# Patient Record
Sex: Male | Born: 1960 | Race: Black or African American | Hispanic: No | Marital: Single | Smoking: Former smoker
Health system: Southern US, Community
[De-identification: ages and names within clinical notes are randomized; demographics above are authoritative.]

## PROBLEM LIST (undated history)

## (undated) DIAGNOSIS — F329 Major depressive disorder, single episode, unspecified: Secondary | ICD-10-CM

## (undated) DIAGNOSIS — I10 Essential (primary) hypertension: Secondary | ICD-10-CM

## (undated) DIAGNOSIS — M810 Age-related osteoporosis without current pathological fracture: Secondary | ICD-10-CM

## (undated) DIAGNOSIS — J45909 Unspecified asthma, uncomplicated: Secondary | ICD-10-CM

## (undated) DIAGNOSIS — T7840XA Allergy, unspecified, initial encounter: Secondary | ICD-10-CM

## (undated) DIAGNOSIS — F32A Depression, unspecified: Secondary | ICD-10-CM

## (undated) DIAGNOSIS — E785 Hyperlipidemia, unspecified: Secondary | ICD-10-CM

## (undated) DIAGNOSIS — F419 Anxiety disorder, unspecified: Secondary | ICD-10-CM

## (undated) DIAGNOSIS — M1990 Unspecified osteoarthritis, unspecified site: Secondary | ICD-10-CM

## (undated) DIAGNOSIS — R011 Cardiac murmur, unspecified: Secondary | ICD-10-CM

## (undated) HISTORY — DX: Major depressive disorder, single episode, unspecified: F32.9

## (undated) HISTORY — DX: Hyperlipidemia, unspecified: E78.5

## (undated) HISTORY — DX: Cardiac murmur, unspecified: R01.1

## (undated) HISTORY — DX: Unspecified osteoarthritis, unspecified site: M19.90

## (undated) HISTORY — DX: Unspecified asthma, uncomplicated: J45.909

## (undated) HISTORY — DX: Depression, unspecified: F32.A

## (undated) HISTORY — PX: NO PAST SURGERIES: SHX2092

## (undated) HISTORY — DX: Age-related osteoporosis without current pathological fracture: M81.0

## (undated) HISTORY — DX: Allergy, unspecified, initial encounter: T78.40XA

## (undated) HISTORY — DX: Anxiety disorder, unspecified: F41.9

---

## 2018-05-04 ENCOUNTER — Encounter (HOSPITAL_COMMUNITY): Payer: Self-pay

## 2018-05-04 ENCOUNTER — Emergency Department (HOSPITAL_COMMUNITY)
Admission: EM | Admit: 2018-05-04 | Discharge: 2018-05-04 | Disposition: A | Discharge status: Home / Self Care | Payer: Medicare HMO | Attending: Emergency Medicine | Admitting: Emergency Medicine

## 2018-05-04 DIAGNOSIS — Z87891 Personal history of nicotine dependence: Secondary | ICD-10-CM | POA: Insufficient documentation

## 2018-05-04 DIAGNOSIS — M722 Plantar fascial fibromatosis: Secondary | ICD-10-CM

## 2018-05-04 DIAGNOSIS — M79671 Pain in right foot: Secondary | ICD-10-CM | POA: Diagnosis present

## 2018-05-04 DIAGNOSIS — I10 Essential (primary) hypertension: Secondary | ICD-10-CM | POA: Diagnosis not present

## 2018-05-04 HISTORY — DX: Essential (primary) hypertension: I10

## 2018-05-04 NOTE — ED Provider Notes (Signed)
[facility] DEPT Provider Note   CSN: [id number] Arrival date & time: 05/04/18  1828     History   Chief Complaint Chief Complaint  Patient presents with  . Foot Pain    HPI Xavier Doyle is a 57 y.o. male with a PMHx of HTN, chronic back pain, and arthritis, who presents to the ED with complaints of right foot pain for the last week.  He states that both of his feet hurt but the right foot is worse.  No injury or trauma.  He describes the pain is 7/10 constant throbbing nonradiating right foot pain on the sole in the heel, worse with movement or weightbearing, and with no treatments tried prior to arrival.  He states that he is on his feet a lot at work.  Of note, he has been out of his blood pressure medication lisinopril for the last 5 months, he has an appointment on 05/12/2018 to establish PCP care and refill of his medication.  He denies any vision changes, headache, leg swelling, fevers, chills, chest pain, shortness breath, abdominal pain, N/V/D/C, hematuria, dysuria, numbness, tingling, focal weakness, or any other complaints at this time.   The history is provided by the patient and medical records. No language interpreter was used.  Foot Pain  Pertinent negatives include no chest pain, no abdominal pain, no headaches and no shortness of breath.    Past Medical History:  Diagnosis Date  . Hypertension     There are no active problems to display for this patient.   History reviewed. No pertinent surgical history.      Home Medications    Prior to Admission medications   Not on File    Family History History reviewed. No pertinent family history.  Social History Social History   Tobacco Use  . Smoking status: Former Research scientist (life sciences)  . Smokeless tobacco: Never Used  Substance Use Topics  . Alcohol use: Yes    Frequency: Never    Comment: occasionally   . Drug use: Never     Allergies   Patient has no known allergies.   Review of  Systems Review of Systems  Constitutional: Negative for chills and fever.  Eyes: Negative for visual disturbance.  Respiratory: Negative for shortness of breath.   Cardiovascular: Negative for chest pain and leg swelling.  Gastrointestinal: Negative for abdominal pain, constipation, diarrhea, nausea and vomiting.  Genitourinary: Negative for dysuria and hematuria.  Musculoskeletal: Positive for arthralgias and myalgias.  Skin: Negative for color change.  Allergic/Immunologic: Negative for immunocompromised state.  Neurological: Negative for weakness, numbness and headaches.  Psychiatric/Behavioral: Negative for confusion.   All other systems reviewed and are negative for acute change except as noted in the HPI.    Physical Exam Updated Vital Signs BP (!) 180/101 (BP Location: Right Arm)   Pulse 95   Temp 98.4 F (36.9 C) (Oral)   Resp 18   Ht 5\' 8"  (1.727 m)   Wt 104.8 kg   SpO2 96%   BMI 35.12 kg/m   Physical Exam Vitals signs and nursing note reviewed.  Constitutional:      General: He is not in acute distress.    Appearance: Normal appearance. He is well-developed. He is not toxic-appearing.     Comments: Afebrile, nontoxic, NAD, HTN noted  HENT:     Head: Normocephalic and atraumatic.  Eyes:     General:        Right eye: No discharge.  Left eye: No discharge.     Conjunctiva/sclera: Conjunctivae normal.  Neck:     Musculoskeletal: Normal range of motion and neck supple.  Cardiovascular:     Rate and Rhythm: Normal rate.     Pulses: Normal pulses.  Pulmonary:     Effort: Pulmonary effort is normal. No respiratory distress.  Abdominal:     General: There is no distension.  Musculoskeletal: Normal range of motion.     Right foot: Normal range of motion. Tenderness present. No swelling, crepitus or deformity.       Feet:     Comments: R foot with FROM intact in all digits, no swelling or bruising, no erythema or warmth, with mild TTP to the plantar fascia  and insertion site at calcaneous, no other areas of bony/joint line TTP to remainder of foot/ankle, no crepitus or deformity, strength and sensation grossly intact, distal pulses intact, soft compartments.   Skin:    General: Skin is warm and dry.     Findings: No rash.  Neurological:     Mental Status: He is alert and oriented to person, place, and time.     Sensory: Sensation is intact. No sensory deficit.     Motor: Motor function is intact.  Psychiatric:        Mood and Affect: Mood and affect normal.        Behavior: Behavior normal.      ED Treatments / Results  Labs (all labs ordered are listed, but only abnormal results are displayed) Labs Reviewed - No data to display  EKG None  Radiology No results found.  Procedures Procedures (including critical care time)  Medications Ordered in ED Medications - No data to display   Initial Impression / Assessment and Plan / ED Course  I have reviewed the triage vital signs and the nursing notes.  Pertinent labs & imaging results that were available during my care of the patient were reviewed by me and considered in my medical decision making (see chart for details).     57 y.o. male here with bilateral foot pain worse on the right.  He is on his feet a lot at work.  On exam, very flat feet, mild tenderness to the plantar fascia and the insertion site at the calcaneus.  No other focal bony or joint line tenderness, no swelling, no skin changes, neurovascularly intact with soft compartments.  Exam consistent with plantar fasciitis.  Discussed use of frozen water bottle for stretching and icing, Tylenol/ibuprofen for pain, supportive shoes, arch support, and follow-up with podiatry.  Of note, patient hypertensive, states that he has been out of his blood pressure medications for 5 months but has an appointment on 05/12/18 to establish PCP care and refill those medications.  He is asymptomatic at this time, doubt need for further  work-up. I explained the diagnosis and have given explicit precautions to return to the ER including for any other new or worsening symptoms. The patient understands and accepts the medical plan as it's been dictated and I have answered their questions. Discharge instructions concerning home care and prescriptions have been given. The patient is STABLE and is discharged to home in good condition.    Final Clinical Impressions(s) / ED Diagnoses   Final diagnoses:  Plantar fasciitis  Essential hypertension    ED Discharge Orders    [address] [place], [place] 05/04/18 [place], [place], DO 05/04/18 1943

## 2018-05-04 NOTE — Discharge Instructions (Addendum)
Your symptoms are consistent with plantar fasciitis. Use a frozen water bottle to stretch and ice your foot each morning and evening, for at least 10-15 minutes each time. You can do these ice stretches during the day as well, as much as you'd like. Make sure you have supportive shoes with good arch support. Change your shoes every 6-12 months as they wear down. You may consider using over the counter plantar fasciitis splints to help with symptoms. Alternate between tylenol and ibuprofen as needed for pain. Follow up with the podiatrist in the next 1-2 weeks for recheck of symptoms and for ongoing management of your foot condition. Return to the ER for emergent changes or worsening symptoms.    Also, your blood pressure was high today, see your regular doctor to get started on your blood pressure medications. Eat a low salt/low sodium diet.

## 2018-05-04 NOTE — ED Triage Notes (Addendum)
Pt presents with c/o right foot pain for almost a week. Pt denies any injury to that foot but reports that it is painful to walk on. Pt does report that both feet are hurting but the right foot is the worst.

## 2018-05-26 ENCOUNTER — Encounter: Payer: Self-pay | Admitting: Gastroenterology

## 2018-06-04 ENCOUNTER — Encounter: Payer: Self-pay | Admitting: Gastroenterology

## 2018-06-04 ENCOUNTER — Ambulatory Visit (AMBULATORY_SURGERY_CENTER): Payer: Self-pay | Admitting: *Deleted

## 2018-06-04 VITALS — Ht 68.0 in | Wt 244.0 lb

## 2018-06-04 DIAGNOSIS — Z1211 Encounter for screening for malignant neoplasm of colon: Secondary | ICD-10-CM

## 2018-06-04 MED ORDER — NA SULFATE-K SULFATE-MG SULF 17.5-3.13-1.6 GM/177ML PO SOLN: 1.0000 | Freq: Once | ORAL | 0 refills | Status: AC

## 2018-06-04 NOTE — Progress Notes (Signed)
No egg or soy allergy known to patient  No past sedation , no past  intubation  No diet pills per patient No home 02 use per patient  No blood thinners per patient  Pt denies issues with constipation  No A fib or A flutter  EMMI video sent to pt's e mail --  Pt states he has no care partner---I explained to him he has to have a CP or we will not do his colon- pt called a cousin while in [place] and was told he can bring pt- explained to Xavier Doyle he has to cancel by 2-11 Tuesday to not be charged if he is not coming- he verbalized understanding

## 2018-06-10 ENCOUNTER — Telehealth: Payer: Self-pay | Admitting: Gastroenterology

## 2018-06-10 MED ORDER — NA SULFATE-K SULFATE-MG SULF 17.5-3.13-1.6 GM/177ML PO SOLN: 1.0000 | Freq: Once | ORAL | 0 refills | Status: AC

## 2018-06-10 NOTE — Telephone Encounter (Signed)
Pt has a procedure on 06-18-18 and has lose his subprep and would like to know if he can get another one.Marland KitchenMarland Kitchen

## 2018-06-10 NOTE — Telephone Encounter (Signed)
I attempted to call pt-  No answer- Left a message that I sent in a script for another  Suprep to the [facility] on [id number] - he can call with questions  Lelan Pons PV

## 2018-06-18 ENCOUNTER — Ambulatory Visit (AMBULATORY_SURGERY_CENTER): Payer: Medicare Other | Admitting: Gastroenterology

## 2018-06-18 ENCOUNTER — Encounter: Payer: Self-pay | Admitting: Gastroenterology

## 2018-06-18 VITALS — BP 143/77 | HR 76 | Temp 97.8°F | Resp 30 | Ht 68.0 in | Wt 244.0 lb

## 2018-06-18 DIAGNOSIS — D125 Benign neoplasm of sigmoid colon: Secondary | ICD-10-CM

## 2018-06-18 DIAGNOSIS — Z1211 Encounter for screening for malignant neoplasm of colon: Secondary | ICD-10-CM | POA: Diagnosis not present

## 2018-06-18 DIAGNOSIS — K635 Polyp of colon: Secondary | ICD-10-CM | POA: Diagnosis not present

## 2018-06-18 MED ORDER — SODIUM CHLORIDE 0.9 % IV SOLN: 500.0000 mL | Freq: Once | INTRAVENOUS | Status: DC

## 2018-06-18 NOTE — Op Note (Signed)
[facility] Patient Name: Xavier Doyle Procedure Date: 06/18/2018 10:08 AM MRN: [id number] Endoscopist: Thornton Park MD, MD Age: 58 Referring MD:  Date of Birth: [birth year] Gender: Male Account #: [id number] Procedure:                Colonoscopy Indications:              Screening for colorectal malignant neoplasm, This                            is the patient's first colonoscopy. No known family                            history of colon cancer or polyps. No baseline GI                            symptoms. Medicines:                See the Anesthesia note for documentation of the                            administered medications Procedure:                Pre-Anesthesia Assessment:                           - Prior to the procedure, a History and Physical                            was performed, and patient medications and                            allergies were reviewed. The patient's tolerance of                            previous anesthesia was also reviewed. The risks                            and benefits of the procedure and the sedation                            options and risks were discussed with the patient.                            All questions were answered, and informed consent                            was obtained. Prior Anticoagulants: The patient has                            taken no previous anticoagulant or antiplatelet                            agents. ASA Grade Assessment: II - A patient with  mild systemic disease. After reviewing the risks                            and benefits, the patient was deemed in                            satisfactory condition to undergo the procedure.                           After obtaining informed consent, the colonoscope                            was passed under direct vision. Throughout the                            procedure, the patient's blood pressure, pulse, and                             oxygen saturations were monitored continuously. The                            Colonoscope was introduced through the anus and                            advanced to the the terminal ileum, with                            identification of the appendiceal orifice and IC                            valve. The colonoscopy was performed without                            difficulty. The patient tolerated the procedure                            well. The quality of the bowel preparation was                            good. The terminal ileum, ileocecal valve,                            appendiceal orifice, and rectum were photographed. Scope In: 10:16:36 AM Scope Out: 10:30:24 AM Scope Withdrawal Time: 0 hours 11 minutes 55 seconds  Total Procedure Duration: 0 hours 13 minutes 48 seconds  Findings:                 The perianal and digital rectal examinations were                            normal.                           Multiple small and large-mouthed diverticula were  found in the entire colon. There was no evidence of                            diverticular bleeding.                           A 8 mm polyp was found in the sigmoid colon. The                            polyp was sessile. The polyp was removed with a hot                            snare. Resection and retrieval were complete.                            Estimated blood loss: none. Estimated blood loss:                            none.                           Non-bleeding hemorrhoids were found.                           The exam was otherwise without abnormality on                            direct and retroflexion views. Complications:            No immediate complications. Estimated blood loss:                            None. Estimated Blood Loss:     Estimated blood loss: none. Impression:               - Severe diverticulosis in the entire examined                             colon. There was no evidence of diverticular                            bleeding.                           - One 8 mm polyp in the sigmoid colon, removed with                            a hot snare. Resected and retrieved.                           - Non-bleeding hemorrhoids.                           - The examination was otherwise normal on direct  and retroflexion views. Recommendation:           - Patient has a contact number available for                            emergencies. The signs and symptoms of potential                            delayed complications were discussed with the                            patient. Return to normal activities tomorrow.                            Written discharge instructions were provided to the                            patient.                           - Resume regular diet. High fiber diet recommended.                           - Continue present medications.                           - Await pathology results.                           - Repeat colonoscopy in 5 years for surveillance if                            the polyp is adenomatous. If the polyp is benign,                            continue with routine colon cancer screening. Thornton Park MD, MD 06/18/2018 10:36:48 AM This report has been signed electronically.

## 2018-06-18 NOTE — Progress Notes (Signed)
To PACU, VSS. Report to [email or web address] 

## 2018-06-18 NOTE — Progress Notes (Signed)
Pt's states no medical or surgical changes since previsit or office visit. 

## 2018-06-18 NOTE — Patient Instructions (Signed)
   Information on polyps ,diverticulosis,hemorrhoids and high fiber diet given to you today  Await pathology on polyp removed today   YOU HAD AN ENDOSCOPIC PROCEDURE TODAY AT [facility]:   Refer to the procedure report that was given to you for any specific questions about what was found during the examination.  If the procedure report does not answer your questions, please call your gastroenterologist to clarify.  If you requested that your care partner not be given the details of your procedure findings, then the procedure report has been included in a sealed envelope for you to review at your convenience later.  YOU SHOULD EXPECT: Some feelings of bloating in the abdomen. Passage of more gas than usual.  Walking can help get rid of the air that was put into your GI tract during the procedure and reduce the bloating. If you had a lower endoscopy (such as a colonoscopy or flexible sigmoidoscopy) you may notice spotting of blood in your stool or on the toilet paper. If you underwent a bowel prep for your procedure, you may not have a normal bowel movement for a few days.  Please Note:  You might notice some irritation and congestion in your nose or some drainage.  This is from the oxygen used during your procedure.  There is no need for concern and it should clear up in a day or so.  SYMPTOMS TO REPORT IMMEDIATELY:   Following lower endoscopy (colonoscopy or flexible sigmoidoscopy):  Excessive amounts of blood in the stool  Significant tenderness or worsening of abdominal pains  Swelling of the abdomen that is new, acute  Fever of 100F or higher    For urgent or emergent issues, a gastroenterologist can be reached at any hour by calling [phone].   DIET:  We do recommend a small meal at first, but then you may proceed to your regular diet.  Drink plenty of fluids but you should avoid alcoholic beverages for 24 hours.  ACTIVITY:  You should plan to take it easy for  the rest of today and you should NOT DRIVE or use heavy machinery until tomorrow (because of the sedation medicines used during the test).    FOLLOW UP: Our staff will call the number listed on your records the next business day following your procedure to check on you and address any questions or concerns that you may have regarding the information given to you following your procedure. If we do not reach you, we will leave a message.  However, if you are feeling well and you are not experiencing any problems, there is no need to return our call.  We will assume that you have returned to your regular daily activities without incident.  If any biopsies were taken you will be contacted by phone or by letter within the next 1-3 weeks.  Please call us at [phone] if you have not heard about the biopsies in 3 weeks.    SIGNATURES/CONFIDENTIALITY: You and/or your care partner have signed paperwork which will be entered into your electronic medical record.  These signatures attest to the fact that that the information above on your After Visit Summary has been reviewed and is understood.  Full responsibility of the confidentiality of this discharge information lies with you and/or your care-partner.

## 2018-06-21 ENCOUNTER — Telehealth: Payer: Self-pay

## 2018-06-21 ENCOUNTER — Telehealth: Payer: Self-pay | Admitting: *Deleted

## 2018-06-21 NOTE — Telephone Encounter (Signed)
Mailbox full ,unable to leave message on f/u call 

## 2018-06-21 NOTE — Telephone Encounter (Signed)
  Follow up Call-  Call back number 06/18/2018  Post procedure Call Back phone  # [phone]  Permission to leave phone message Yes     No ID on voicemail; no message left

## 2018-06-24 ENCOUNTER — Encounter: Payer: Self-pay | Admitting: Gastroenterology

## 2019-05-22 ENCOUNTER — Encounter (HOSPITAL_COMMUNITY): Payer: Self-pay | Admitting: Emergency Medicine

## 2019-05-22 ENCOUNTER — Inpatient Hospital Stay (HOSPITAL_COMMUNITY)
Admission: EM | Admit: 2019-05-22 | Discharge: 2019-07-04 | DRG: 286 | Disposition: E | Payer: Medicare Other | Attending: Pulmonary Disease | Admitting: Pulmonary Disease

## 2019-05-22 ENCOUNTER — Emergency Department (HOSPITAL_COMMUNITY): Payer: Medicare Other

## 2019-05-22 ENCOUNTER — Other Ambulatory Visit: Payer: Self-pay

## 2019-05-22 DIAGNOSIS — Z9189 Other specified personal risk factors, not elsewhere classified: Secondary | ICD-10-CM

## 2019-05-22 DIAGNOSIS — R0600 Dyspnea, unspecified: Secondary | ICD-10-CM

## 2019-05-22 DIAGNOSIS — J9600 Acute respiratory failure, unspecified whether with hypoxia or hypercapnia: Secondary | ICD-10-CM

## 2019-05-22 DIAGNOSIS — I272 Pulmonary hypertension, unspecified: Secondary | ICD-10-CM | POA: Diagnosis present

## 2019-05-22 DIAGNOSIS — J15211 Pneumonia due to Methicillin susceptible Staphylococcus aureus: Secondary | ICD-10-CM | POA: Diagnosis not present

## 2019-05-22 DIAGNOSIS — N1831 Chronic kidney disease, stage 3a: Secondary | ICD-10-CM | POA: Diagnosis present

## 2019-05-22 DIAGNOSIS — I472 Ventricular tachycardia: Secondary | ICD-10-CM | POA: Diagnosis not present

## 2019-05-22 DIAGNOSIS — Z515 Encounter for palliative care: Secondary | ICD-10-CM

## 2019-05-22 DIAGNOSIS — I130 Hypertensive heart and chronic kidney disease with heart failure and stage 1 through stage 4 chronic kidney disease, or unspecified chronic kidney disease: Secondary | ICD-10-CM | POA: Diagnosis not present

## 2019-05-22 DIAGNOSIS — I493 Ventricular premature depolarization: Secondary | ICD-10-CM | POA: Diagnosis present

## 2019-05-22 DIAGNOSIS — I5021 Acute systolic (congestive) heart failure: Secondary | ICD-10-CM

## 2019-05-22 DIAGNOSIS — J181 Lobar pneumonia, unspecified organism: Secondary | ICD-10-CM

## 2019-05-22 DIAGNOSIS — E874 Mixed disorder of acid-base balance: Secondary | ICD-10-CM | POA: Diagnosis not present

## 2019-05-22 DIAGNOSIS — Z1623 Resistance to quinolones and fluoroquinolones: Secondary | ICD-10-CM | POA: Diagnosis present

## 2019-05-22 DIAGNOSIS — G931 Anoxic brain damage, not elsewhere classified: Secondary | ICD-10-CM

## 2019-05-22 DIAGNOSIS — Y95 Nosocomial condition: Secondary | ICD-10-CM | POA: Diagnosis not present

## 2019-05-22 DIAGNOSIS — E1165 Type 2 diabetes mellitus with hyperglycemia: Secondary | ICD-10-CM | POA: Diagnosis not present

## 2019-05-22 DIAGNOSIS — I462 Cardiac arrest due to underlying cardiac condition: Secondary | ICD-10-CM | POA: Diagnosis not present

## 2019-05-22 DIAGNOSIS — R079 Chest pain, unspecified: Secondary | ICD-10-CM | POA: Diagnosis not present

## 2019-05-22 DIAGNOSIS — T17908A Unspecified foreign body in respiratory tract, part unspecified causing other injury, initial encounter: Secondary | ICD-10-CM

## 2019-05-22 DIAGNOSIS — E876 Hypokalemia: Secondary | ICD-10-CM | POA: Diagnosis not present

## 2019-05-22 DIAGNOSIS — I509 Heart failure, unspecified: Secondary | ICD-10-CM

## 2019-05-22 DIAGNOSIS — J9602 Acute respiratory failure with hypercapnia: Secondary | ICD-10-CM | POA: Diagnosis not present

## 2019-05-22 DIAGNOSIS — R569 Unspecified convulsions: Secondary | ICD-10-CM | POA: Diagnosis not present

## 2019-05-22 DIAGNOSIS — Z6841 Body Mass Index (BMI) 40.0 and over, adult: Secondary | ICD-10-CM

## 2019-05-22 DIAGNOSIS — R509 Fever, unspecified: Secondary | ICD-10-CM

## 2019-05-22 DIAGNOSIS — M810 Age-related osteoporosis without current pathological fracture: Secondary | ICD-10-CM | POA: Diagnosis present

## 2019-05-22 DIAGNOSIS — G4730 Sleep apnea, unspecified: Secondary | ICD-10-CM | POA: Diagnosis present

## 2019-05-22 DIAGNOSIS — J45909 Unspecified asthma, uncomplicated: Secondary | ICD-10-CM | POA: Diagnosis present

## 2019-05-22 DIAGNOSIS — T85598A Other mechanical complication of other gastrointestinal prosthetic devices, implants and grafts, initial encounter: Secondary | ICD-10-CM

## 2019-05-22 DIAGNOSIS — G9341 Metabolic encephalopathy: Secondary | ICD-10-CM | POA: Diagnosis not present

## 2019-05-22 DIAGNOSIS — E870 Hyperosmolality and hypernatremia: Secondary | ICD-10-CM | POA: Diagnosis not present

## 2019-05-22 DIAGNOSIS — R34 Anuria and oliguria: Secondary | ICD-10-CM | POA: Diagnosis not present

## 2019-05-22 DIAGNOSIS — E785 Hyperlipidemia, unspecified: Secondary | ICD-10-CM

## 2019-05-22 DIAGNOSIS — I10 Essential (primary) hypertension: Secondary | ICD-10-CM

## 2019-05-22 DIAGNOSIS — Z4659 Encounter for fitting and adjustment of other gastrointestinal appliance and device: Secondary | ICD-10-CM

## 2019-05-22 DIAGNOSIS — R7303 Prediabetes: Secondary | ICD-10-CM

## 2019-05-22 DIAGNOSIS — Z452 Encounter for adjustment and management of vascular access device: Secondary | ICD-10-CM

## 2019-05-22 DIAGNOSIS — M19071 Primary osteoarthritis, right ankle and foot: Secondary | ICD-10-CM | POA: Diagnosis present

## 2019-05-22 DIAGNOSIS — M170 Bilateral primary osteoarthritis of knee: Secondary | ICD-10-CM | POA: Diagnosis present

## 2019-05-22 DIAGNOSIS — G253 Myoclonus: Secondary | ICD-10-CM | POA: Diagnosis not present

## 2019-05-22 DIAGNOSIS — F1010 Alcohol abuse, uncomplicated: Secondary | ICD-10-CM | POA: Diagnosis present

## 2019-05-22 DIAGNOSIS — I4901 Ventricular fibrillation: Secondary | ICD-10-CM

## 2019-05-22 DIAGNOSIS — I5041 Acute combined systolic (congestive) and diastolic (congestive) heart failure: Secondary | ICD-10-CM | POA: Diagnosis present

## 2019-05-22 DIAGNOSIS — Z978 Presence of other specified devices: Secondary | ICD-10-CM

## 2019-05-22 DIAGNOSIS — Z66 Do not resuscitate: Secondary | ICD-10-CM | POA: Diagnosis not present

## 2019-05-22 DIAGNOSIS — Z20822 Contact with and (suspected) exposure to covid-19: Secondary | ICD-10-CM | POA: Diagnosis present

## 2019-05-22 DIAGNOSIS — J9691 Respiratory failure, unspecified with hypoxia: Secondary | ICD-10-CM

## 2019-05-22 DIAGNOSIS — F17210 Nicotine dependence, cigarettes, uncomplicated: Secondary | ICD-10-CM | POA: Diagnosis present

## 2019-05-22 DIAGNOSIS — J9819 Other pulmonary collapse: Secondary | ICD-10-CM

## 2019-05-22 DIAGNOSIS — E1122 Type 2 diabetes mellitus with diabetic chronic kidney disease: Secondary | ICD-10-CM | POA: Diagnosis present

## 2019-05-22 DIAGNOSIS — M47816 Spondylosis without myelopathy or radiculopathy, lumbar region: Secondary | ICD-10-CM | POA: Diagnosis present

## 2019-05-22 DIAGNOSIS — I428 Other cardiomyopathies: Secondary | ICD-10-CM

## 2019-05-22 DIAGNOSIS — Z7189 Other specified counseling: Secondary | ICD-10-CM

## 2019-05-22 DIAGNOSIS — J690 Pneumonitis due to inhalation of food and vomit: Secondary | ICD-10-CM | POA: Diagnosis not present

## 2019-05-22 DIAGNOSIS — J9601 Acute respiratory failure with hypoxia: Secondary | ICD-10-CM | POA: Diagnosis not present

## 2019-05-22 DIAGNOSIS — N179 Acute kidney failure, unspecified: Secondary | ICD-10-CM | POA: Diagnosis not present

## 2019-05-22 DIAGNOSIS — R7401 Elevation of levels of liver transaminase levels: Secondary | ICD-10-CM | POA: Diagnosis not present

## 2019-05-22 DIAGNOSIS — M19072 Primary osteoarthritis, left ankle and foot: Secondary | ICD-10-CM | POA: Diagnosis present

## 2019-05-22 DIAGNOSIS — L8990 Pressure ulcer of unspecified site, unspecified stage: Secondary | ICD-10-CM | POA: Insufficient documentation

## 2019-05-22 DIAGNOSIS — R627 Adult failure to thrive: Secondary | ICD-10-CM

## 2019-05-22 DIAGNOSIS — I469 Cardiac arrest, cause unspecified: Secondary | ICD-10-CM

## 2019-05-22 LAB — TROPONIN I (HIGH SENSITIVITY)
Troponin I (High Sensitivity): 52 ng/L — ABNORMAL HIGH (ref <18)
Troponin I (High Sensitivity): 54 ng/L — ABNORMAL HIGH (ref <18)

## 2019-05-22 LAB — CBC
HCT: 40.6 % (ref 39.0–52.0)
Hemoglobin: 11.8 g/dL — ABNORMAL LOW (ref 13.0–17.0)
MCH: 23 pg — ABNORMAL LOW (ref 26.0–34.0)
MCHC: 29.1 g/dL — ABNORMAL LOW (ref 30.0–36.0)
MCV: 79.3 fL — ABNORMAL LOW (ref 80.0–100.0)
Platelets: 273 10*3/uL (ref 150–400)
RBC: 5.12 MIL/uL (ref 4.22–5.81)
RDW: 16.4 % — ABNORMAL HIGH (ref 11.5–15.5)
WBC: 6.9 10*3/uL (ref 4.0–10.5)
nRBC: 0 % (ref 0.0–0.2)

## 2019-05-22 LAB — BASIC METABOLIC PANEL
Anion gap: 9 (ref 5–15)
BUN: 12 mg/dL (ref 6–20)
CO2: 25 mmol/L (ref 22–32)
Calcium: 8.7 mg/dL — ABNORMAL LOW (ref 8.9–10.3)
Chloride: 107 mmol/L (ref 98–111)
Creatinine, Ser: 1.2 mg/dL (ref 0.61–1.24)
GFR calc Af Amer: >60 mL/min (ref >60)
GFR calc non Af Amer: >60 mL/min (ref >60)
Glucose, Bld: 114 mg/dL — ABNORMAL HIGH (ref 70–99)
Potassium: 3.5 mmol/L (ref 3.5–5.1)
Sodium: 141 mmol/L (ref 135–145)

## 2019-05-22 LAB — LIPID PANEL
Cholesterol: 188 mg/dL (ref 0–200)
HDL: 39 mg/dL — ABNORMAL LOW (ref >40)
LDL Cholesterol: 120 mg/dL — ABNORMAL HIGH (ref 0–99)
Total CHOL/HDL Ratio: 4.8 RATIO
Triglycerides: 145 mg/dL (ref <150)
VLDL: 29 mg/dL (ref 0–40)

## 2019-05-22 LAB — RESPIRATORY PANEL BY RT PCR (FLU A&B, COVID)
Influenza A by PCR: NEGATIVE
Influenza B by PCR: NEGATIVE
SARS Coronavirus 2 by RT PCR: NEGATIVE

## 2019-05-22 LAB — HIV ANTIBODY (ROUTINE TESTING W REFLEX): HIV Screen 4th Generation wRfx: NONREACTIVE

## 2019-05-22 LAB — CBG MONITORING, ED: Glucose-Capillary: 104 mg/dL — ABNORMAL HIGH (ref 70–99)

## 2019-05-22 LAB — HEPARIN LEVEL (UNFRACTIONATED): Heparin Unfractionated: <0.1 IU/mL — ABNORMAL LOW (ref 0.30–0.70)

## 2019-05-22 LAB — D-DIMER, QUANTITATIVE: D-Dimer, Quant: 0.47 ug/mL-FEU (ref 0.00–0.50)

## 2019-05-22 LAB — TSH: TSH: 1.149 u[IU]/mL (ref 0.350–4.500)

## 2019-05-22 MED ORDER — HEPARIN BOLUS VIA INFUSION
4000.0000 [IU] | Freq: Once | INTRAVENOUS | Status: AC
  Administered 2019-05-22: 13:00:00 4000 [IU] via INTRAVENOUS
  Filled 2019-05-22: qty 4000

## 2019-05-22 MED ORDER — MORPHINE SULFATE (PF) 4 MG/ML IV SOLN
4.0000 mg | Freq: Once | INTRAVENOUS | Status: AC
  Administered 2019-05-22: 12:00:00 4 mg via INTRAVENOUS
  Filled 2019-05-22: qty 1

## 2019-05-22 MED ORDER — ASPIRIN 81 MG PO CHEW: 324.0000 mg | CHEWABLE_TABLET | Freq: Once | ORAL | Status: DC

## 2019-05-22 MED ORDER — TAMSULOSIN HCL 0.4 MG PO CAPS
0.4000 mg | ORAL_CAPSULE | Freq: Every day | ORAL | Status: DC
  Administered 2019-05-23 – 2019-05-24 (×2): 0.4 mg via ORAL
  Filled 2019-05-22 (×2): qty 1

## 2019-05-22 MED ORDER — ONDANSETRON HCL 4 MG/2ML IJ SOLN: 4.0000 mg | Freq: Four times a day (QID) | INTRAMUSCULAR | Status: DC | PRN

## 2019-05-22 MED ORDER — HEPARIN (PORCINE) 25000 UT/250ML-% IV SOLN
1150.0000 [IU]/h | INTRAVENOUS | Status: DC
  Administered 2019-05-22: 13:00:00 1150 [IU]/h via INTRAVENOUS
  Filled 2019-05-22: qty 250

## 2019-05-22 MED ORDER — ENOXAPARIN SODIUM 40 MG/0.4ML ~~LOC~~ SOLN
40.0000 mg | SUBCUTANEOUS | Status: DC
  Administered 2019-05-22 – 2019-05-23 (×2): 40 mg via SUBCUTANEOUS
  Filled 2019-05-22 (×2): qty 0.4

## 2019-05-22 MED ORDER — HYDRALAZINE HCL 10 MG PO TABS
10.0000 mg | ORAL_TABLET | Freq: Three times a day (TID) | ORAL | Status: DC
  Administered 2019-05-22 – 2019-05-23 (×2): 10 mg via ORAL
  Filled 2019-05-22 (×2): qty 1

## 2019-05-22 MED ORDER — HYDROCHLOROTHIAZIDE 25 MG PO TABS
25.0000 mg | ORAL_TABLET | Freq: Every day | ORAL | Status: DC
  Administered 2019-05-23 – 2019-05-24 (×2): 25 mg via ORAL
  Filled 2019-05-22 (×2): qty 1

## 2019-05-22 MED ORDER — ACETAMINOPHEN 325 MG PO TABS: 650.0000 mg | ORAL_TABLET | ORAL | Status: DC | PRN

## 2019-05-22 MED ORDER — NITROGLYCERIN 0.4 MG SL SUBL: 0.4000 mg | SUBLINGUAL_TABLET | SUBLINGUAL | Status: DC | PRN

## 2019-05-22 MED ORDER — LOSARTAN POTASSIUM 50 MG PO TABS
100.0000 mg | ORAL_TABLET | Freq: Every day | ORAL | Status: DC
  Administered 2019-05-23 – 2019-05-24 (×2): 100 mg via ORAL
  Filled 2019-05-22 (×2): qty 2

## 2019-05-22 MED ORDER — ASPIRIN EC 81 MG PO TBEC
81.0000 mg | DELAYED_RELEASE_TABLET | Freq: Every day | ORAL | Status: DC
  Administered 2019-05-23 – 2019-05-25 (×3): 81 mg via ORAL
  Filled 2019-05-22 (×3): qty 1

## 2019-05-22 MED ORDER — SODIUM CHLORIDE 0.9% FLUSH
3.0000 mL | Freq: Once | INTRAVENOUS | Status: AC
  Administered 2019-05-22: 12:00:00 3 mL via INTRAVENOUS

## 2019-05-22 MED ORDER — ALBUTEROL SULFATE (2.5 MG/3ML) 0.083% IN NEBU: 2.5000 mg | INHALATION_SOLUTION | Freq: Four times a day (QID) | RESPIRATORY_TRACT | Status: DC | PRN

## 2019-05-22 NOTE — ED Notes (Signed)
Pt arrived to Rm [facility] via stretcher - alert and oriented. Monitor intact to pt. Pt denies any c/o chest pain or shortness of breath. Pt talking on cell phone w/o difficulty.

## 2019-05-22 NOTE — ED Provider Notes (Signed)
[facility] Provider Note   CSN: [id number] Arrival date & time: 06/02/2019  1057     History Chief Complaint  Patient presents with  . Chest Pain    Xavier Doyle is a 59 y.o. male.  HPI   Patient presents ED for evaluation of chest pain.  Patient states symptoms started this morning maybe around 9:00.  He was helping his mother when she started to fall.  Patient had to catch her and he started to fall himself.  Patient was able to get his mother to the bed without falling but he did fall.  Patient states he started having sharp pain in on the left side of his chest.  He did feel somewhat short of breath.  He called EMS.  He was given aspirin and nitroglycerin.  He had some slight improvement of his symptoms.  Patient does have history of hypertension and hyperlipidemia but he denies any history of heart disease.  Patient denies any fevers or chills.  No cough or shortness of breath.  Past Medical History:  Diagnosis Date  . Allergy   . Anxiety   . Arthritis    both knees and ankles, lower back   . Asthma   . Depression   . Heart murmur   . Hyperlipidemia   . Hypertension   . Osteoporosis     There are no problems to display for this patient.   Past Surgical History:  Procedure Laterality Date  . NO PAST SURGERIES         Family History  Problem Relation Age of Onset  . Colon cancer Neg Hx   . Colon polyps Neg Hx   . Esophageal cancer Neg Hx   . Rectal cancer Neg Hx   . Stomach cancer Neg Hx     Social History   Tobacco Use  . Smoking status: Former Research scientist (life sciences)  . Smokeless tobacco: Never Used  Substance Use Topics  . Alcohol use: Yes    Comment: occasionally   . Drug use: Never    Home Medications Prior to Admission medications   Medication Sig Start Date End Date Taking? Authorizing Provider  furosemide (LASIX) 40 MG tablet Take 40 mg by mouth daily as needed for fluid. 04/20/19  Yes [provider]    losartan-hydrochlorothiazide (HYZAAR) 100-25 MG tablet Take 1 tablet by mouth daily.  05/12/18  Yes [provider]  OVER THE COUNTER MEDICATION Neutragenix daily   Yes [provider]  OVER THE COUNTER MEDICATION Take 1 tablet by mouth daily. Keto diet pill    Yes [provider]  PROAIR HFA 108 (90 Base) MCG/ACT inhaler Inhale 2 puffs into the lungs every 6 (six) hours as needed for wheezing.  05/12/18  Yes [provider]  tamsulosin (FLOMAX) 0.4 MG CAPS capsule Take 0.4 mg by mouth.   Yes [provider]    Allergies    Patient has no known allergies.  Review of Systems   Review of Systems  Physical Exam Updated Vital Signs BP 119/90   Pulse 92   Temp 97.6 F (36.4 C) (Oral)   Resp (!) 23   Ht 1.727 m (5\' 8" )   Wt 124.7 kg   SpO2 95%   BMI 41.81 kg/m   Physical Exam  ED Results / Procedures / Treatments   Labs (all labs ordered are listed, but only abnormal results are displayed) Labs Reviewed  BASIC METABOLIC PANEL - Abnormal; Notable for the following  components:      Result Value   Glucose, Bld 114 (*)    Calcium 8.7 (*)    All other components within normal limits  CBC - Abnormal; Notable for the following components:   Hemoglobin 11.8 (*)    MCV 79.3 (*)    MCH 23.0 (*)    MCHC 29.1 (*)    RDW 16.4 (*)    All other components within normal limits  CBG MONITORING, ED - Abnormal; Notable for the following components:   Glucose-Capillary 104 (*)    All other components within normal limits  TROPONIN I (HIGH SENSITIVITY) - Abnormal; Notable for the following components:   Troponin I (High Sensitivity) 52 (*)    All other components within normal limits  TROPONIN I (HIGH SENSITIVITY) - Abnormal; Notable for the following components:   Troponin I (High Sensitivity) 54 (*)    All other components within normal limits  RESPIRATORY PANEL BY RT PCR (FLU A&B, COVID)  HEPARIN LEVEL (UNFRACTIONATED)    EKG EKG  Interpretation  Date/Time:  Sunday May 22 2019 11:03:12 EST Ventricular Rate:  96 PR Interval:  158 QRS Duration: 90 QT Interval:  360 QTC Calculation: 454 R Axis:   80 Text Interpretation: Sinus rhythm with Premature atrial complexes Possible Left atrial enlargement Cannot rule out Anterior infarct , age undetermined T wave abnormality, consider inferior ischemia Abnormal ECG No previous tracing Confirmed by Dorie Rank [id number]) on 06/05/2019 11:18:52 AM   Radiology DG Chest 2 View  Result Date: 05/21/2019 CLINICAL DATA:  Chest pain, shortness of breath. EXAM: CHEST - 2 VIEW COMPARISON:  None. FINDINGS: Mild cardiomegaly is noted. No pneumothorax or significant pleural effusion is noted. Lungs are clear. Bony thorax is unremarkable. IMPRESSION: No active cardiopulmonary disease. Electronically Signed   By: Marijo Conception M.D.   On: 05/10/2019 11:33    Procedures .Critical Care Performed by: Dorie Rank, MD Authorized by: Dorie Rank, MD   Critical care provider statement:    Critical care time (minutes):  45   Critical care was time spent personally by me on the following activities:  Discussions with consultants, evaluation of patient's response to treatment, examination of patient, ordering and performing treatments and interventions, ordering and review of laboratory studies, ordering and review of radiographic studies, pulse oximetry, re-evaluation of patient's condition, obtaining history from patient or surrogate and review of old charts   (including critical care time)  Medications Ordered in ED Medications  aspirin chewable tablet 324 mg (324 mg Oral Not Given 05/25/2019 1145)  heparin ADULT infusion 100 units/mL (25000 units/258mL sodium chloride 0.45%) (1,150 Units/hr Intravenous New Bag/Given 05/19/2019 1252)  sodium chloride flush (NS) 0.9 % injection 3 mL (3 mLs Intravenous Given 05/14/2019 1155)  morphine 4 MG/ML injection 4 mg (4 mg Intravenous Given 05/21/2019 1152)  heparin  bolus via infusion 4,000 Units (4,000 Units Intravenous Bolus from Bag 05/10/2019 1253)    ED Course  I have reviewed the triage vital signs and the nursing notes.  Pertinent labs & imaging results that were available during my care of the patient were reviewed by me and considered in my medical decision making (see chart for details).  Clinical Course as of May 21 1432  Nancy Fetter May 22, 2019  1209 Initial troponin is elevated.  Patient will require admission.  Will check delta troponin.   [id number] Patient states symptoms are improving.  Does have some mild discomfort still.   [JK]    Clinical Course  User Index [JK] Dorie Rank, MD   MDM Rules/Calculators/A&P                      She presented to the ED for evaluation of chest pain.  Some atypical features with the sharp discomfort however did have some shortness of breath and partial improvement with aspirin and nitroglycerin.  Patient does have risk factors and an abnormal EKG.  His troponin is elevated however the delta troponin is not significantly changed.  Patient was started on heparin.  He was also given aspirin and nitroglycerin previously.  I will consult with the medical service for admission further evaluation, possible cardiology consultation. Final Clinical Impression(s) / ED Diagnoses Final diagnoses:  None      Dorie Rank, MD 05/12/2019 1435

## 2019-05-22 NOTE — H&P (Addendum)
[facility] Admission History and Physical Service Pager: [phone]  Patient name: Xavier Doyle Medical record number: [id number] Date of birth: [birth year] Age: 59 y.o. Gender: male  Primary Care Provider: System, Pcp Not In Consultants: None Code Status: Full code Preferred Emergency Contact: Onalee Hua (mother's caretaker) [phone]  Chief Complaint: chest pain  Assessment and Plan: Xavier Doyle is a 59 y.o. male presenting with chest pain and SOB. PMH is significant for HTn, HLD and asthma.    Chest Pain Rule Out  Xavier Doyle reports chest pain that began while helping attempting to prevent his mother from falling earlier this morning. Patient's chest pain was still present despite receiving nitroglycerin and 324 ASA. He describes pain in the left side of his chest that is sharp in character.  At the time of interview, patient states that left-sided chest pain is now decreased to a minimum but is experiencing right upper pectoral pain. EKG on admission notable for PVCs but no signs of ischemia, patient does have inverted T waves.  Patient denies history of MI but does report "being between a heart attack and stroke" during a prior admission.  On exam, patient's chest pain is not reproducible by palpation, patient has systolic heart murmur, regular heart rate and no friction rub.  Potential etiologies for chest pain include musculoskeletal pain, unstable angina, GERD. He reports frequently training with weight resistance.  In the ED, patient has troponin mildly elevated with values of 52 and 54 on repeat.  Heart score 3-4 patient was started on heparin drip, will discontinue as it is low suspicion for cardiac cause for chest pain at this time.  Considered PE, Well's score 0, no further workup for PE.  History and physical most consistent with MSK pain but will admit due to Heart score of 3-4. Will admit for observation and ACS rule out overnight.  Of note, patient reports  remote history of cocaine use but denies use for several years. Will complete UDS to rule out cocaine as a potential contributing factor to chest pain.  She reports consuming ~1 quart of moonshine per week. -admit to inpatient cardiac telemetry, attending Dr. Andria Frames  -monitor Tele -Trend troponin once more in am -HIV -Hemoglobin A1c -TSH -UDS -A.m. EKG  -A.m. CBC -monitor BMP   HTN  Home medication includes losartan-HCTZ 100-25 daily.  Blood pressure elevated in ED to 151/112 patient states that he last took this medication this morning.  Home meds also include Lasix 40 as needed for fluid.  Patient denies history of heart failure or MI. -Continue losartan-HCTZ -Monitor blood pressure with vitals  Asthma  Patient reportedly uses albuterol inhaler at home prior to and in between weight training sessions. No SOB or wheezing at this time. -Continue home albuterol inhaler PRN  AKI vs CKD Cr elevated at 1.2. Unsure of patient's baseline Cr. Patient reports taking Losartan-HCTZ this AM and has lasix listed as home medication in the chart.  -monitor Cr with BMP   Alcohol use Drinks 1 quart of moonshine per week. Denies previous hospitalizations for withdrawal.  Denies ever having withdrawal symptoms. -Monitor CIWA  FEN/GI: Regular diet Prophylaxis: Lovenox 40 mg  Disposition: Admit to cardiac telemetry  History of Present Illness:  Xavier Doyle is a 59 y.o. male presenting with chest pain and increasing SOB .PMH is significant for HTn, HLD, ashtma, arthritis, anxiety, osteoporosis, depression and anxiety.  Patient reports experiencing chest pain earlier this morning while trying to prevent his mother from falling.  He  ended up falling to the floor in the process of helping his mother.  When he began to stand up, he felt strong, sharp chest pain under his left pectoral muscle.  He later felt sharp pain over his right chest.  Denies pain in his left arm, jaw, chest.  He has had one  previous episode of similar chest pain for which he was hospitalized about 1 year ago. This feels very similar to that last episode. He is concerned because during that hospitalization, he was told that he was in between a heart attack and a stroke, given similar presentations a day patient decided to seek out emergent care.  This am, he reports taking losartan-HCTZ and occasionally uses albuterol during weight lifting.  Patient reports drinking about 1 quart of moonshine per week.  Patient states that he intermittently smokes cigarettes and denies needing a nicotine patch shortness admission.  Patient reports smoking cocaine several years ago but denies current or recent use.  Review Of Systems: Per HPI with the following additions:   Review of Systems  Constitutional: Negative for chills, diaphoresis and fever.  Eyes: Negative for blurred vision and double vision.  Respiratory: Positive for shortness of breath.   Cardiovascular: Positive for chest pain.  Gastrointestinal: Negative for abdominal pain and nausea.  Genitourinary: Negative for dysuria.  Neurological: Positive for weakness. Negative for loss of consciousness and headaches.    Patient Active Problem List   Diagnosis Date Noted  . Chest pain 05/09/2019    Past Medical History: Past Medical History:  Diagnosis Date  . Allergy   . Anxiety   . Arthritis    both knees and ankles, lower back   . Asthma   . Depression   . Heart murmur   . Hyperlipidemia   . Hypertension   . Osteoporosis     Past Surgical History: Past Surgical History:  Procedure Laterality Date  . NO PAST SURGERIES      Social History: Social History   Tobacco Use  . Smoking status: Former Research scientist (life sciences)  . Smokeless tobacco: Never Used  . Tobacco comment: patient reports smoking infrequently   Substance Use Topics  . Alcohol use: Yes    Comment: 1 quart of moonshine per week   . Drug use: Yes    Types: "Crack" cocaine    Comment: denies current  use, used "several years ago"    Family History: Family History  Problem Relation Age of Onset  . Colon cancer Neg Hx   . Colon polyps Neg Hx   . Esophageal cancer Neg Hx   . Rectal cancer Neg Hx   . Stomach cancer Neg Hx      Allergies and Medications: No Known Allergies No current facility-administered medications on file prior to encounter.   Current Outpatient Medications on File Prior to Encounter  Medication Sig Dispense Refill  . furosemide (LASIX) 40 MG tablet Take 40 mg by mouth daily as needed for fluid.    Marland Kitchen losartan-hydrochlorothiazide (HYZAAR) 100-25 MG tablet Take 1 tablet by mouth daily.     Marland Kitchen OVER THE COUNTER MEDICATION Neutragenix daily    . OVER THE COUNTER MEDICATION Take 1 tablet by mouth daily. Keto diet pill     . PROAIR HFA 108 (90 Base) MCG/ACT inhaler Inhale 2 puffs into the lungs every 6 (six) hours as needed for wheezing.     . tamsulosin (FLOMAX) 0.4 MG CAPS capsule Take 0.4 mg by mouth.      Objective: BP Marland Kitchen)  151/112 (BP Location: Right Arm)   Pulse 92   Temp 97.8 F (36.6 C) (Oral)   Resp 20   Ht 5\' 8"  (1.727 m)   Wt 123 kg   SpO2 100%   BMI 41.24 kg/m   Exam: General: Obese male sitting on edge of bed in no acute distress Eyes: No scleral icterus, extraocular muscles intact bilaterally Neck: No JVD noted Cardiovascular: Systolic murmur, regular rate, no friction rub Respiratory: Patient with coughing fit while trying to take deep breaths, otherwise no crackles, no wheezing, patient stable on room air Gastrointestinal: Nontender, obese abdomen, bowel sounds present MSK: Patient moves all extremities, no deformities noted Derm: Dry and intact Neuro: Alert and oriented x4 Psych: Responds appropriately to questions, normal rate of speech  Labs and Imaging: CBC BMET  Recent Labs  Lab 05/25/2019 1110  WBC 6.9  HGB 11.8*  HCT 40.6  PLT 273   Recent Labs  Lab 05/18/2019 1110  NA 141  K 3.5  CL 107  CO2 25  BUN 12  CREATININE 1.20   GLUCOSE 114*  CALCIUM 8.7*     EKG: PVCs, inverted T waves    DG Chest 2 View  Result Date: 05/31/2019 CLINICAL DATA:  Chest pain, shortness of breath. EXAM: CHEST - 2 VIEW COMPARISON:  None. FINDINGS: Mild cardiomegaly is noted. No pneumothorax or significant pleural effusion is noted. Lungs are clear. Bony thorax is unremarkable. IMPRESSION: No active cardiopulmonary disease. Electronically Signed   By: Marijo Conception M.D.   On: 06/02/2019 11:33    Stark Klein, MD 05/31/2019, 6:43 PM PGY-1, [facility] Intern pager: [phone], text pages welcome

## 2019-05-22 NOTE — Plan of Care (Signed)
  Problem: Clinical Measurements: Goal: Ability to maintain clinical measurements within normal limits will improve Outcome: Progressing   Problem: Clinical Measurements: Goal: Cardiovascular complication will be avoided Outcome: Progressing   Problem: Pain Managment: Goal: General experience of comfort will improve Outcome: Progressing   

## 2019-05-22 NOTE — Progress Notes (Signed)
ANTICOAGULATION CONSULT NOTE - Initial Consult  Pharmacy Consult for Heparin Indication: chest pain/ACS  No Known Allergies  Patient Measurements: Height: 5\' 8"  (172.7 cm) Weight: 275 lb (124.7 kg) IBW/kg (Calculated) : 68.4 Heparin Dosing Weight: 97.3 kg  Vital Signs: Temp: 97.6 F (36.4 C) (01/17 1058) Temp Source: Oral (01/17 1058) BP: 119/90 (01/17 1200) Pulse Rate: 92 (01/17 1200)  Labs: Recent Labs    06/04/2019 1110  HGB 11.8*  HCT 40.6  PLT 273  CREATININE 1.20  TROPONINIHS 52*    Estimated Creatinine Clearance: 86.3 mL/min (by C-G formula based on SCr of 1.2 mg/dL).   Medical History: Past Medical History:  Diagnosis Date  . Allergy   . Anxiety   . Arthritis    both knees and ankles, lower back   . Asthma   . Depression   . Heart murmur   . Hyperlipidemia   . Hypertension   . Osteoporosis     Medications:  Scheduled:  . aspirin  324 mg Oral Once  . heparin  4,000 Units Intravenous Once    Assessment: Patient is a 18 yom that presented to the ED with chest pain. Patients trop is elevated and pharmacy has been asked to dose heparin for ACS in this patient.   Goal of Therapy:  Heparin level 0.3-0.7 units/ml Monitor platelets by anticoagulation protocol: Yes   Plan:  - Heparin bolus of 4000 units IV x 1 dose  - Heparin Drip @ 1150 units/hr  - Heparin level in 6 hours  - Monitor patient for s/s of bleeding and CBC while on heparin     Duanne Limerick PharmD. BCPS  05/11/2019,12:38 PM

## 2019-05-22 NOTE — ED Notes (Signed)
Patient transported to X-ray 

## 2019-05-22 NOTE — ED Triage Notes (Signed)
Pt arrives via [facility] from home with c/o sharp left sided chest pain and sob that began while he was picking his mother up off of the ground. 1nitro and 324mg  asa pta without reduction in pain, 20 g IV in L hand. 12 lead unremarkable other than occasional PVCs.

## 2019-05-23 ENCOUNTER — Observation Stay (HOSPITAL_BASED_OUTPATIENT_CLINIC_OR_DEPARTMENT_OTHER): Payer: Medicare Other

## 2019-05-23 DIAGNOSIS — R079 Chest pain, unspecified: Secondary | ICD-10-CM | POA: Diagnosis not present

## 2019-05-23 DIAGNOSIS — I340 Nonrheumatic mitral (valve) insufficiency: Secondary | ICD-10-CM

## 2019-05-23 DIAGNOSIS — I259 Chronic ischemic heart disease, unspecified: Secondary | ICD-10-CM

## 2019-05-23 DIAGNOSIS — I351 Nonrheumatic aortic (valve) insufficiency: Secondary | ICD-10-CM | POA: Diagnosis not present

## 2019-05-23 DIAGNOSIS — I5021 Acute systolic (congestive) heart failure: Secondary | ICD-10-CM | POA: Diagnosis not present

## 2019-05-23 DIAGNOSIS — I10 Essential (primary) hypertension: Secondary | ICD-10-CM | POA: Diagnosis not present

## 2019-05-23 DIAGNOSIS — E785 Hyperlipidemia, unspecified: Secondary | ICD-10-CM

## 2019-05-23 DIAGNOSIS — E782 Mixed hyperlipidemia: Secondary | ICD-10-CM

## 2019-05-23 DIAGNOSIS — R7303 Prediabetes: Secondary | ICD-10-CM

## 2019-05-23 LAB — ECHOCARDIOGRAM COMPLETE
Height: 68 in
Weight: 4332.8 oz

## 2019-05-23 LAB — BASIC METABOLIC PANEL
Anion gap: 9 (ref 5–15)
BUN: 12 mg/dL (ref 6–20)
CO2: 27 mmol/L (ref 22–32)
Calcium: 8.7 mg/dL — ABNORMAL LOW (ref 8.9–10.3)
Chloride: 103 mmol/L (ref 98–111)
Creatinine, Ser: 0.94 mg/dL (ref 0.61–1.24)
GFR calc Af Amer: >60 mL/min (ref >60)
GFR calc non Af Amer: >60 mL/min (ref >60)
Glucose, Bld: 97 mg/dL (ref 70–99)
Potassium: 3.7 mmol/L (ref 3.5–5.1)
Sodium: 139 mmol/L (ref 135–145)

## 2019-05-23 LAB — CBC
HCT: 40.2 % (ref 39.0–52.0)
Hemoglobin: 11.9 g/dL — ABNORMAL LOW (ref 13.0–17.0)
MCH: 23 pg — ABNORMAL LOW (ref 26.0–34.0)
MCHC: 29.6 g/dL — ABNORMAL LOW (ref 30.0–36.0)
MCV: 77.6 fL — ABNORMAL LOW (ref 80.0–100.0)
Platelets: 259 10*3/uL (ref 150–400)
RBC: 5.18 MIL/uL (ref 4.22–5.81)
RDW: 16.4 % — ABNORMAL HIGH (ref 11.5–15.5)
WBC: 6.3 10*3/uL (ref 4.0–10.5)
nRBC: 0 % (ref 0.0–0.2)

## 2019-05-23 LAB — TROPONIN I (HIGH SENSITIVITY): Troponin I (High Sensitivity): 58 ng/L — ABNORMAL HIGH (ref <18)

## 2019-05-23 LAB — HEMOGLOBIN A1C
Hgb A1c MFr Bld: 6.4 % — ABNORMAL HIGH (ref 4.8–5.6)
Mean Plasma Glucose: 136.98 mg/dL

## 2019-05-23 MED ORDER — SODIUM CHLORIDE 0.9 % WEIGHT BASED INFUSION
3.0000 mL/kg/h | INTRAVENOUS | Status: DC
  Administered 2019-05-24: 05:00:00 3 mL/kg/h via INTRAVENOUS

## 2019-05-23 MED ORDER — SODIUM CHLORIDE 0.9% FLUSH
3.0000 mL | Freq: Two times a day (BID) | INTRAVENOUS | Status: DC
  Administered 2019-05-24: 08:00:00 3 mL via INTRAVENOUS

## 2019-05-23 MED ORDER — SODIUM CHLORIDE 0.9% FLUSH: 3.0000 mL | INTRAVENOUS | Status: DC | PRN

## 2019-05-23 MED ORDER — SODIUM CHLORIDE 0.9 % IV SOLN: 250.0000 mL | INTRAVENOUS | Status: DC | PRN

## 2019-05-23 MED ORDER — ATORVASTATIN CALCIUM 40 MG PO TABS: 40.0000 mg | ORAL_TABLET | Freq: Every day | ORAL | Status: DC

## 2019-05-23 MED ORDER — METOPROLOL TARTRATE 50 MG PO TABS
50.0000 mg | ORAL_TABLET | Freq: Once | ORAL | Status: AC | PRN
  Administered 2019-05-23: 50 mg via ORAL
  Filled 2019-05-23: qty 1

## 2019-05-23 MED ORDER — SODIUM CHLORIDE 0.9 % WEIGHT BASED INFUSION: 1.0000 mL/kg/h | INTRAVENOUS | Status: DC

## 2019-05-23 MED ORDER — ATORVASTATIN CALCIUM 80 MG PO TABS
80.0000 mg | ORAL_TABLET | Freq: Every day | ORAL | Status: DC
  Administered 2019-05-23 – 2019-05-24 (×2): 80 mg via ORAL
  Filled 2019-05-23 (×2): qty 1

## 2019-05-23 MED ORDER — METOPROLOL SUCCINATE ER 50 MG PO TB24
50.0000 mg | ORAL_TABLET | Freq: Every day | ORAL | Status: DC
  Administered 2019-05-23 – 2019-05-24 (×2): 50 mg via ORAL
  Filled 2019-05-23 (×2): qty 1

## 2019-05-23 NOTE — Progress Notes (Addendum)
Family Medicine Teaching Service Daily Progress Note Intern Pager: [phone]  Patient name: Xavier Doyle Medical record number: [id number] Date of birth: 1961/04/07 Age: 59 y.o. Gender: male  Primary Care Provider: System, Pcp Not In Consultants: None Code Status: Full Code   Pt Overview and Major Events to Date:  1/17: admitted for ACS rule out, trop 52>54 1/18: Troponins trended up to 58  Assessment and Plan: Xavier Doyle is a 59 y.o. male presenting with chest pain and SOB. PMH is significant for HTN, HLD and asthma.    ACS Rule out   Mr. Lank reports he continues to have chest pain rated as 6/10.  Patient reports that this pain has improved since he first arrived to the hospital.  Patient is denying shortness of breath.   Pulmonary embolism with considered, however well's score 0, no further workup for PE.  History and physical most consistent with MSK pain but admitted due to Heart score of 3-4. Of note, patient reported remote history of cocaine use but denies use for several years. UDS negative.  HIV negative, hemoglobin A1c elevated at 6.4, TSH within normal limits.  CBC within normal limits. Telemetry noted to have PACs but no ST elevation.  LDL elevated at 120, cholesterol 188. -Cardiology consulted, will appreciate recommendations -Tele, NSR overnight with some PACs -Continue to trend troponin -monitor BMP   HTN  Home medication includes losartan-HCTZ 100-25 daily.  Blood pressure elevated in ED to 151/112 but decreased overnight with added hydralazine 10mg  TIDto 141/89 this AM.  Patient states that he last took Losartan yesterday AM.  Home meds also include Lasix 40 as needed for fluid.  Patient denies history of heart failure or MI. Patient denies HA, CP or SOB this AM -Continue losartan-HCTZ -Monitor blood pressure with vitals -start Metoprolol 50mg  daily  -discontinue hydralazine   Asthma  Patient reportedly uses albuterol inhaler at home prior to and in between weight  training sessions. No SOB or wheezing at this time. -Continue home albuterol inhaler PRN  AKI vs CKD Cr elevated at 1.2. Unsure of patient's baseline Cr. Patient reports taking Losartan-HCTZ this AM and has lasix listed as home medication in the chart.  -monitor Cr with BMP   Alcohol use Drinks 1 quart of moonshine per week. Denies previous hospitalizations for withdrawal.  Denies ever having withdrawal symptoms. -Monitor CIWA  HLD Patient noted to have elevated LDL at 120, cholesterol 188, HDL low at 39, triglycerides 145.  List of home medications did not include statin on admission. -We will suggest outpatient follow-up of cholesterol and starting statin -ASCVD Risk 9.4% 10 year risk of cardiovascular event  FEN/GI: Regular diet Prophylaxis: Lovenox 40 mg  Disposition: discharge pending cardiology evaluation and recs, likely discharge home today   Subjective:  Patient states that his chest pain has improved and is rated as 6/10.  Patient is denies shortness of breath this morning.  Patient inquiring as to when he may be discharged home.  Objective: Temp:  [97.8 F (36.6 C)-98.6 F (37 C)] 98.6 F (37 C) (01/18 0313) Pulse Rate:  [79-107] 79 (01/18 0313) Resp:  [15-23] 19 (01/18 0313) BP: (107-161)/(75-112) 145/107 (01/18 0855) SpO2:  [93 %-100 %] 98 % (01/18 0855) Weight:  [122.8 kg-124.7 kg] 122.8 kg (01/18 0313)  Physical Exam: General: Obese male lying in bed supine in no acute distress on room air Cardiovascular: Heart murmur noted systolic, regular rate, no friction rub Respiratory: Clear to auscultation bilaterally without crackles, able on room air Abdomen:  Obese abdomen, mildly firm to palpation, bowel sounds appreciated Extremities: Moves all extremities, no lower extremity edema bilaterally  Laboratory: Recent Labs  Lab 05/11/2019 1110 05/23/19 0728  WBC 6.9 6.3  HGB 11.8* 11.9*  HCT 40.6 40.2  PLT 273 259   Recent Labs  Lab 05/06/2019 1110  05/23/19 0728  NA 141 139  K 3.5 3.7  CL 107 103  CO2 25 27  BUN 12 12  CREATININE 1.20 0.94  CALCIUM 8.7* 8.7*  GLUCOSE 114* 97    Imaging/Diagnostic Tests: No results found.   Stark Klein, MD 05/23/2019, 11:30 AM PGY-1, [facility] Intern pager: [phone], text pages welcome

## 2019-05-23 NOTE — Consult Note (Addendum)
The patient has been seen in conjunction with Daune Perch, NP. All aspects of care have been considered and discussed. The patient has been personally interviewed, examined, and all clinical data has been reviewed.   Chest pain with atypical features for ischemia.  Multiple risk factors for vascular events: Prediabetes; hyperlipidemia; essential hypertension; obesity; possible OSA.  Coronary CTA with morphology to exclude high risk anatomy such as left main disease or proximal three-vessel disease.  Aggressive risk factor modification needs to occur.  Therapy should be with preventive agents including high intensity statin therapy, ARB therapy, and consideration of SGLT2 (future).  Will need diuretic therapy for facilitation of BP control.  Long conversation concerning the preferred approach of primary prevention rather than secondary prevention.      Cardiology Consultation:   Patient ID: Xavier Doyle MRN: [id number]; DOB: 1960-07-05  Admit date: 05/13/2019 Date of Consult: 05/23/2019  Primary Care Provider: System, Pcp Not In Primary Cardiologist: New to [facility] (Dr. Tamala Julian) Primary Electrophysiologist:  None    Patient Profile:   Xavier Doyle is a 59 y.o. male with a history of hypertension, hyperlipidemia, asthma, arthritis, anxiety/depression, alcohol abuse, and prior cocaine abuse but no known cardiac history who is being seen today for the evaluation of chest pain at the request of Dr. Andria Frames.  History of Present Illness:   Xavier Doyle is a 59 year old male with the above history. Patient has no known cardiac history and has never seen a Cardiologist or had any type of cardiac work-up. He states that one time he came to the hospital and his BP was so elevated that he was told he "was in between a heart attack and a stroke." Patient presented to the ED yesterday via EMS for evaluation of chest pain.   Patient states he was lifting his mother off the floor yesterday when all of  a sudden he developed sharp chest pain that started on the left side of his chest and then radiated to the right with associated shortness of breath and palpitations but no nausea, vomiting, diaphoresis, dizziness, lightheadedness, or near syncope/syncope. Pain lasted for 30-40 minutes and then resolved with Nitro and Aspirin in the ED. Patient states he has never had chest pain like this before. He is a "power lifter" and goes to the gym for 2-2.5 hours every day lifting weights. He states he will sometimes have chest wall soreness the following day but never pain like this. 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 He occasional has shortness of breath with activity and at night but he attributes this to his asthma and states it improves with his inhaler. No orthopnea, PND, or lower extremity edema. He does report snoring and some daytime fatigue but states he has never been tested for sleep apnea.   Of note, he also mentions occasional blurry vision/diplopia that occurs randomly. Last time last week. He states he talked to his PCP about this but cannot remember what was said. He is on Losartan-HCTZ at home but states he does not take his medicine regularly.   In the ED, vitals stable. EKG showed showed normal sinus rhythm with T wave inversions in inferior and lateral leads. High-sensitivity troponin minimally elevated and flat at 52 >> 54 >> 58. D-dimer negative. Chest x-ray showed no acute findings.  WBC 6.9, Hgb 11.8, Plts 273. Na 141, K 3.5, Glucose 114, Cr 1.20. COVD-19 testing negative. Urine drug screen pending.  At the time of this evaluation, patient resting comfortably eating lunch. Currently chest pain free.  Patient reports smoking off and on since he was a teenager. He states he currently only smokes when he is upset or stressed and states 1 pack of cigarettes will last him about 1 week. He also reports alcohol use and states he will  drink 1 jar of moonshine over a 1 week span about once every month as well as drink occasional a few beers occasionally. He states he is a prior "cocaine addict" but has not used in the last 6-7 years. No other drug use. He is unaware of any family history of heart disease or stroke.   Heart Pathway Score:     Past Medical History:  Diagnosis Date  . Allergy   . Anxiety   . Arthritis    both knees and ankles, lower back   . Asthma   . Depression   . Heart murmur   . Hyperlipidemia   . Hypertension   . Osteoporosis     Past Surgical History:  Procedure Laterality Date  . NO PAST SURGERIES       Home Medications:  Prior to Admission medications   Medication Sig Start Date End Date Taking? Authorizing Provider  furosemide (LASIX) 40 MG tablet Take 40 mg by mouth daily as needed for fluid. 04/20/19  Yes [provider]  losartan-hydrochlorothiazide (HYZAAR) 100-25 MG tablet Take 1 tablet by mouth daily.  05/12/18  Yes [provider]  OVER THE COUNTER MEDICATION Neutragenix daily   Yes [provider]  OVER THE COUNTER MEDICATION Take 1 tablet by mouth daily. Keto diet pill    Yes [provider]  PROAIR HFA 108 (90 Base) MCG/ACT inhaler Inhale 2 puffs into the lungs every 6 (six) hours as needed for wheezing.  05/12/18  Yes [provider]  tamsulosin (FLOMAX) 0.4 MG CAPS capsule Take 0.4 mg by mouth.   Yes [provider]    Inpatient Medications: Scheduled Meds: . aspirin EC  81 mg Oral Daily  . enoxaparin (LOVENOX) injection  40 mg Subcutaneous Q24H  . hydrochlorothiazide  25 mg Oral Daily  . losartan  100 mg Oral Daily  . metoprolol succinate  50 mg Oral Daily  . tamsulosin  0.4 mg Oral QPC breakfast   Continuous Infusions:  PRN Meds: acetaminophen, albuterol, nitroGLYCERIN, ondansetron (ZOFRAN) IV  Allergies:   No Known Allergies  Social History:   Social History   Socioeconomic History  . Marital status: Single      Spouse name: Not on file  . Number of children: Not on file  . Years of education: Not on file  . Highest education level: Not on file  Occupational History  . Not on file  Tobacco Use  . Smoking status: Former Research scientist (life sciences)  . Smokeless tobacco: Never Used  . Tobacco comment: patient reports smoking infrequently   Substance and Sexual Activity  . Alcohol use: Yes    Comment: 1 quart of moonshine per week   . Drug use: Yes    Types: "Crack" cocaine    Comment: denies current use, used "several years ago"  . Sexual activity: Not on file  Other Topics Concern  . Not on file  Social History Narrative  . Not on file   Social Determinants of Health   Financial Resource Strain:   . Difficulty of Paying Living Expenses: Not on file  Food Insecurity:   . Worried About Estate manager/land agent  of Food in the Last Year: Not on file  . Ran Out of Food in the Last Year: Not on file  Transportation Needs:   . Lack of Transportation (Medical): Not on file  . Lack of Transportation (Non-Medical): Not on file  Physical Activity:   . Days of Exercise per Week: Not on file  . Minutes of Exercise per Session: Not on file  Stress:   . Feeling of Stress : Not on file  Social Connections:   . Frequency of Communication with Friends and Family: Not on file  . Frequency of Social Gatherings with Friends and Family: Not on file  . Attends Religious Services: Not on file  . Active Member of Clubs or Organizations: Not on file  . Attends Archivist Meetings: Not on file  . Marital Status: Not on file  Intimate Partner Violence:   . Fear of Current or Ex-Partner: Not on file  . Emotionally Abused: Not on file  . Physically Abused: Not on file  . Sexually Abused: Not on file    Family History:   Family History  Problem Relation Age of Onset  . Colon cancer Neg Hx   . Colon polyps Neg Hx   . Esophageal cancer Neg Hx   . Rectal cancer Neg Hx   . Stomach cancer Neg Hx      ROS:  Please see the  history of present illness.  All other ROS reviewed and negative.     Physical Exam/Data:   Vitals:   05/23/19 0313 05/23/19 0855 05/23/19 1158 05/23/19 1200  BP: (!) 141/89 (!) 145/107 (!) 153/94 (!) 153/94  Pulse: 79   77  Resp: 19     Temp: 98.6 F (37 C)     TempSrc: Oral     SpO2: 98% 98%    Weight: 122.8 kg     Height:        Intake/Output Summary (Last 24 hours) at 05/23/2019 1348 Last data filed at 05/23/2019 0900 Gross per 24 hour  Intake 304.02 ml  Output 100 ml  Net 204.02 ml   Last 3 Weights 05/23/2019 06/02/2019 05/25/2019  Weight (lbs) 270 lb 12.8 oz 271 lb 3.2 oz 275 lb  Weight (kg) 122.834 kg 123.016 kg 124.739 kg     Body mass index is 41.17 kg/m.  General: 59 y.o. obese African-American male resting comfortably in no acute distress. HEENT: Normocephalic and atraumatic. Sclera clear.  Neck: Supple. No carotid bruits. No JVD. Heart: RRR. Distinct S1 and S2. No murmurs, gallops, or rubs. Radial and distal pedal pulses 2+ and equal bilaterally. Lungs: No increased work of breathing. Clear to ausculation bilaterally. No wheezes, rhonchi, or rales.  Abdomen: Soft, obese, and non-tender to palpation. Bowel sounds present. MSK: Normal strength and tone for age. Extremities: No lower extremity edema.    Skin: Warm and dry. Neuro: Alert and oriented x3. No focal deficits. Psych: Normal affect. Responds appropriately.   EKG:  The EKG was personally reviewed and demonstrates:  Normal sinus rhythm, rate 96 bpm, with possible left atrial enlargement and T wave inversion in inferior leads and leads V5-V6.   Telemetry:  Telemetry was personally reviewed and demonstrates:  Normal sinus rhythm/sinus tachycardia with rates in the 80's to low 100's. Multiple PVCs noted.  Relevant CV Studies: N/A  Laboratory Data:  High Sensitivity Troponin:   Recent Labs  Lab 05/15/2019 1110 05/07/2019 1251 05/23/19 0728  TROPONINIHS 52* 54* 58*     Chemistry Recent Labs  Lab  05/21/2019 1110 05/23/19 0728  NA 141 139  K 3.5 3.7  CL 107 103  CO2 25 27  GLUCOSE 114* 97  BUN 12 12  CREATININE 1.20 0.94  CALCIUM 8.7* 8.7*  GFRNONAA >60 >60  GFRAA >60 >60  ANIONGAP 9 9    No results for input(s): PROT, ALBUMIN, AST, ALT, ALKPHOS, BILITOT in the last 168 hours. Hematology Recent Labs  Lab 05/17/2019 1110 05/23/19 0728  WBC 6.9 6.3  RBC 5.12 5.18  HGB 11.8* 11.9*  HCT 40.6 40.2  MCV 79.3* 77.6*  MCH 23.0* 23.0*  MCHC 29.1* 29.6*  RDW 16.4* 16.4*  PLT 273 259   BNPNo results for input(s): BNP, PROBNP in the last 168 hours.  DDimer  Recent Labs  Lab 05/20/2019 1850  DDIMER 0.47     Radiology/Studies:  DG Chest 2 View  Result Date: 06/01/2019 CLINICAL DATA:  Chest pain, shortness of breath. EXAM: CHEST - 2 VIEW COMPARISON:  None. FINDINGS: Mild cardiomegaly is noted. No pneumothorax or significant pleural effusion is noted. Lungs are clear. Bony thorax is unremarkable. IMPRESSION: No active cardiopulmonary disease. Electronically Signed   By: Marijo Conception M.D.   On: 05/23/2019 11:33    TIMI Risk Score for Unstable Angina or Non-ST Elevation MI:   The patient's TIMI risk score is 3, which indicates a 13% risk of all cause mortality, new or recurrent myocardial infarction or need for urgent revascularization in the next 14 days.   Assessment and Plan:   Chest Pain - Patient presented with sudden onset of chest pain that resolved with Nitro. - EKG shows T wave inversions in inferior leads and leads V5-V6. No prior tracing for comparison. - High-sensitivity troponin minimally elevated and flat at  52 >> 54 >> 58. Not consistent with ACS. - D-dimer negative.  - Currently chest pain free. - Will check Echo.  - CV risk factor include poorly controlled HTN, HLD, pre-diabetes, obesity, and tobacco use. Discussed with Dr. Tamala Julian. Will order coronary CT for further evaluation. Will add Lopressor to slow down heart rate for scan.   Hypertension - BP  elevated often in the 150's /90's-100's. Patient states he does not consistently take his home medications. - Continue home Losartan 100mg  daily and HCTZ 25mg  daily.  - Started on Toprol-XL 50mg  today. Could consider switching to Coreg for better BP control but patient does have a history of asthma.  Hyperlipidemia - Lipid panel this admission: Total Cholesterol 188, Triglycerides 145, HDL 39, LDL 120. - Continue home Losartan 100mg  daily and HCTZ 25mg  daily.  - Will start Lipitor 80mg  daily. - Will need lipids rechecked in 6-8 weeks.   Pre-Diabetes - Hemoglobin A1c 6.4 this admission consistent with pre-diabetes. - Recommend lifestyle modifications.   Suspected Sleep Apnea - Patient reports snoring and daytime fatigue. - BMI 41.17.  - Suspect he has sleep apnea but has never had a sleep study. - Recommend outpatient sleep study.  Tobacco Use - Complete cessation recommended.    For questions or updates, please contact [facility] Please consult [email or web address] for contact info under     Signed, Darreld Mclean, PA-C  05/23/2019 1:48 PM

## 2019-05-23 NOTE — Progress Notes (Signed)
   Echo showed LVEF of 25-30% with global hypokinesis, moderate LVH, and grade 1 diastolic dysfunction. Discussed results with Dr. Tamala Julian. Will discontinue coronary CT and plan for right/left heart catheterization tomorrow. Discussed this with patient. The patient understands that risks include but are not limited to stroke (1 in 1000), death (1 in 4), kidney failure [usually temporary] (1 in 500), bleeding (1 in 200), allergic reaction [possibly serious] (1 in 200), and agrees to proceed.   Darreld Mclean, PA-C 05/23/2019 4:23 PM

## 2019-05-23 NOTE — Progress Notes (Signed)
  Echocardiogram 2D Echocardiogram has been performed.  Xavier Doyle 05/23/2019, 2:57 PM

## 2019-05-23 NOTE — Progress Notes (Signed)
VAST consulted to place access for CT. Upon arrival at pt's bedside, pt having ECHO performed. VAST will return for access placement after 20 mins.

## 2019-05-24 ENCOUNTER — Inpatient Hospital Stay (HOSPITAL_COMMUNITY): Payer: Medicare Other

## 2019-05-24 ENCOUNTER — Inpatient Hospital Stay (HOSPITAL_COMMUNITY): Payer: Medicare Other | Admitting: Certified Registered"

## 2019-05-24 ENCOUNTER — Encounter (HOSPITAL_COMMUNITY): Admission: EM | Disposition: E | Payer: Self-pay | Source: Home / Self Care | Attending: Pulmonary Disease

## 2019-05-24 DIAGNOSIS — R627 Adult failure to thrive: Secondary | ICD-10-CM | POA: Diagnosis not present

## 2019-05-24 DIAGNOSIS — J9621 Acute and chronic respiratory failure with hypoxia: Secondary | ICD-10-CM | POA: Diagnosis not present

## 2019-05-24 DIAGNOSIS — I10 Essential (primary) hypertension: Secondary | ICD-10-CM | POA: Diagnosis not present

## 2019-05-24 DIAGNOSIS — E870 Hyperosmolality and hypernatremia: Secondary | ICD-10-CM | POA: Diagnosis not present

## 2019-05-24 DIAGNOSIS — Z515 Encounter for palliative care: Secondary | ICD-10-CM | POA: Diagnosis not present

## 2019-05-24 DIAGNOSIS — M19071 Primary osteoarthritis, right ankle and foot: Secondary | ICD-10-CM | POA: Diagnosis present

## 2019-05-24 DIAGNOSIS — M47816 Spondylosis without myelopathy or radiculopathy, lumbar region: Secondary | ICD-10-CM | POA: Diagnosis present

## 2019-05-24 DIAGNOSIS — J9602 Acute respiratory failure with hypercapnia: Secondary | ICD-10-CM | POA: Diagnosis not present

## 2019-05-24 DIAGNOSIS — I5041 Acute combined systolic (congestive) and diastolic (congestive) heart failure: Secondary | ICD-10-CM | POA: Diagnosis present

## 2019-05-24 DIAGNOSIS — M19072 Primary osteoarthritis, left ankle and foot: Secondary | ICD-10-CM | POA: Diagnosis present

## 2019-05-24 DIAGNOSIS — I4901 Ventricular fibrillation: Secondary | ICD-10-CM

## 2019-05-24 DIAGNOSIS — R570 Cardiogenic shock: Secondary | ICD-10-CM | POA: Diagnosis not present

## 2019-05-24 DIAGNOSIS — Y95 Nosocomial condition: Secondary | ICD-10-CM | POA: Diagnosis not present

## 2019-05-24 DIAGNOSIS — E874 Mixed disorder of acid-base balance: Secondary | ICD-10-CM | POA: Diagnosis not present

## 2019-05-24 DIAGNOSIS — J15211 Pneumonia due to Methicillin susceptible Staphylococcus aureus: Secondary | ICD-10-CM | POA: Diagnosis not present

## 2019-05-24 DIAGNOSIS — I130 Hypertensive heart and chronic kidney disease with heart failure and stage 1 through stage 4 chronic kidney disease, or unspecified chronic kidney disease: Secondary | ICD-10-CM | POA: Diagnosis present

## 2019-05-24 DIAGNOSIS — J9601 Acute respiratory failure with hypoxia: Secondary | ICD-10-CM | POA: Diagnosis not present

## 2019-05-24 DIAGNOSIS — R079 Chest pain, unspecified: Secondary | ICD-10-CM | POA: Diagnosis present

## 2019-05-24 DIAGNOSIS — Z6841 Body Mass Index (BMI) 40.0 and over, adult: Secondary | ICD-10-CM | POA: Diagnosis not present

## 2019-05-24 DIAGNOSIS — I472 Ventricular tachycardia: Secondary | ICD-10-CM | POA: Diagnosis not present

## 2019-05-24 DIAGNOSIS — I469 Cardiac arrest, cause unspecified: Secondary | ICD-10-CM | POA: Diagnosis not present

## 2019-05-24 DIAGNOSIS — E785 Hyperlipidemia, unspecified: Secondary | ICD-10-CM | POA: Diagnosis present

## 2019-05-24 DIAGNOSIS — M170 Bilateral primary osteoarthritis of knee: Secondary | ICD-10-CM | POA: Diagnosis present

## 2019-05-24 DIAGNOSIS — I5021 Acute systolic (congestive) heart failure: Secondary | ICD-10-CM | POA: Diagnosis not present

## 2019-05-24 DIAGNOSIS — J9811 Atelectasis: Secondary | ICD-10-CM | POA: Diagnosis not present

## 2019-05-24 DIAGNOSIS — M7989 Other specified soft tissue disorders: Secondary | ICD-10-CM | POA: Diagnosis not present

## 2019-05-24 DIAGNOSIS — G931 Anoxic brain damage, not elsewhere classified: Secondary | ICD-10-CM | POA: Diagnosis not present

## 2019-05-24 DIAGNOSIS — Z4682 Encounter for fitting and adjustment of non-vascular catheter: Secondary | ICD-10-CM | POA: Diagnosis not present

## 2019-05-24 DIAGNOSIS — J811 Chronic pulmonary edema: Secondary | ICD-10-CM | POA: Diagnosis not present

## 2019-05-24 DIAGNOSIS — I428 Other cardiomyopathies: Secondary | ICD-10-CM | POA: Diagnosis present

## 2019-05-24 DIAGNOSIS — Z978 Presence of other specified devices: Secondary | ICD-10-CM | POA: Diagnosis not present

## 2019-05-24 DIAGNOSIS — J45909 Unspecified asthma, uncomplicated: Secondary | ICD-10-CM | POA: Diagnosis not present

## 2019-05-24 DIAGNOSIS — Z7189 Other specified counseling: Secondary | ICD-10-CM | POA: Diagnosis not present

## 2019-05-24 DIAGNOSIS — M810 Age-related osteoporosis without current pathological fracture: Secondary | ICD-10-CM | POA: Diagnosis present

## 2019-05-24 DIAGNOSIS — Z20822 Contact with and (suspected) exposure to covid-19: Secondary | ICD-10-CM | POA: Diagnosis present

## 2019-05-24 DIAGNOSIS — N179 Acute kidney failure, unspecified: Secondary | ICD-10-CM | POA: Diagnosis not present

## 2019-05-24 DIAGNOSIS — Z66 Do not resuscitate: Secondary | ICD-10-CM | POA: Diagnosis not present

## 2019-05-24 DIAGNOSIS — R7303 Prediabetes: Secondary | ICD-10-CM | POA: Diagnosis not present

## 2019-05-24 DIAGNOSIS — R509 Fever, unspecified: Secondary | ICD-10-CM | POA: Diagnosis not present

## 2019-05-24 DIAGNOSIS — G9341 Metabolic encephalopathy: Secondary | ICD-10-CM | POA: Diagnosis not present

## 2019-05-24 DIAGNOSIS — J690 Pneumonitis due to inhalation of food and vomit: Secondary | ICD-10-CM | POA: Diagnosis not present

## 2019-05-24 DIAGNOSIS — R9401 Abnormal electroencephalogram [EEG]: Secondary | ICD-10-CM | POA: Diagnosis not present

## 2019-05-24 DIAGNOSIS — J9611 Chronic respiratory failure with hypoxia: Secondary | ICD-10-CM | POA: Diagnosis not present

## 2019-05-24 DIAGNOSIS — Z1623 Resistance to quinolones and fluoroquinolones: Secondary | ICD-10-CM | POA: Diagnosis present

## 2019-05-24 HISTORY — PX: RIGHT/LEFT HEART CATH AND CORONARY ANGIOGRAPHY: CATH118266

## 2019-05-24 LAB — BASIC METABOLIC PANEL
Anion gap: 10 (ref 5–15)
Anion gap: 16 — ABNORMAL HIGH (ref 5–15)
BUN: 13 mg/dL (ref 6–20)
BUN: 14 mg/dL (ref 6–20)
CO2: 26 mmol/L (ref 22–32)
CO2: 22 mmol/L (ref 22–32)
Calcium: 9.1 mg/dL (ref 8.9–10.3)
Calcium: 8.1 mg/dL — ABNORMAL LOW (ref 8.9–10.3)
Chloride: 104 mmol/L (ref 98–111)
Chloride: 100 mmol/L (ref 98–111)
Creatinine, Ser: 0.9 mg/dL (ref 0.61–1.24)
Creatinine, Ser: 1.35 mg/dL — ABNORMAL HIGH (ref 0.61–1.24)
GFR calc Af Amer: >60 mL/min (ref >60)
GFR calc Af Amer: >60 mL/min (ref >60)
GFR calc non Af Amer: >60 mL/min (ref >60)
GFR calc non Af Amer: 57 mL/min — ABNORMAL LOW (ref >60)
Glucose, Bld: 107 mg/dL — ABNORMAL HIGH (ref 70–99)
Glucose, Bld: 232 mg/dL — ABNORMAL HIGH (ref 70–99)
Potassium: 4 mmol/L (ref 3.5–5.1)
Potassium: 3.3 mmol/L — ABNORMAL LOW (ref 3.5–5.1)
Sodium: 140 mmol/L (ref 135–145)
Sodium: 138 mmol/L (ref 135–145)

## 2019-05-24 LAB — POCT I-STAT EG7
Acid-Base Excess: 1 mmol/L (ref 0.0–2.0)
Acid-base deficit: 2 mmol/L (ref 0.0–2.0)
Bicarbonate: 27.7 mmol/L (ref 20.0–28.0)
Bicarbonate: 27.7 mmol/L (ref 20.0–28.0)
Calcium, Ion: 1.26 mmol/L (ref 1.15–1.40)
Calcium, Ion: 1.16 mmol/L (ref 1.15–1.40)
HCT: 40 % (ref 39.0–52.0)
HCT: 45 % (ref 39.0–52.0)
Hemoglobin: 13.6 g/dL (ref 13.0–17.0)
Hemoglobin: 15.3 g/dL (ref 13.0–17.0)
O2 Saturation: 67 %
O2 Saturation: 63 %
Patient temperature: 34.4
Potassium: 3.7 mmol/L (ref 3.5–5.1)
Potassium: 4.3 mmol/L (ref 3.5–5.1)
Sodium: 140 mmol/L (ref 135–145)
Sodium: 141 mmol/L (ref 135–145)
TCO2: 29 mmol/L (ref 22–32)
TCO2: 30 mmol/L (ref 22–32)
pCO2, Ven: 50.8 mmHg (ref 44.0–60.0)
pCO2, Ven: 60.5 mmHg — ABNORMAL HIGH (ref 44.0–60.0)
pH, Ven: 7.344 (ref 7.250–7.430)
pH, Ven: 7.255 (ref 7.250–7.430)
pO2, Ven: 37 mmHg (ref 32.0–45.0)
pO2, Ven: 33 mmHg (ref 32.0–45.0)

## 2019-05-24 LAB — POCT I-STAT 7, (LYTES, BLD GAS, ICA,H+H)
Acid-Base Excess: 1 mmol/L (ref 0.0–2.0)
Acid-base deficit: 1 mmol/L (ref 0.0–2.0)
Bicarbonate: 26.2 mmol/L (ref 20.0–28.0)
Bicarbonate: 28.8 mmol/L — ABNORMAL HIGH (ref 20.0–28.0)
Calcium, Ion: 1.2 mmol/L (ref 1.15–1.40)
Calcium, Ion: 1.17 mmol/L (ref 1.15–1.40)
HCT: 37 % — ABNORMAL LOW (ref 39.0–52.0)
HCT: 45 % (ref 39.0–52.0)
Hemoglobin: 12.6 g/dL — ABNORMAL LOW (ref 13.0–17.0)
Hemoglobin: 15.3 g/dL (ref 13.0–17.0)
O2 Saturation: 96 %
O2 Saturation: 90 %
Patient temperature: 36.4
Potassium: 3.6 mmol/L (ref 3.5–5.1)
Potassium: 3.4 mmol/L — ABNORMAL LOW (ref 3.5–5.1)
Sodium: 140 mmol/L (ref 135–145)
Sodium: 141 mmol/L (ref 135–145)
TCO2: 27 mmol/L (ref 22–32)
TCO2: 31 mmol/L (ref 22–32)
pCO2 arterial: 43.9 mmHg (ref 32.0–48.0)
pCO2 arterial: 64.7 mmHg — ABNORMAL HIGH (ref 32.0–48.0)
pH, Arterial: 7.384 (ref 7.350–7.450)
pH, Arterial: 7.253 — ABNORMAL LOW (ref 7.350–7.450)
pO2, Arterial: 81 mmHg — ABNORMAL LOW (ref 83.0–108.0)
pO2, Arterial: 66 mmHg — ABNORMAL LOW (ref 83.0–108.0)

## 2019-05-24 LAB — MAGNESIUM: Magnesium: 2.2 mg/dL (ref 1.7–2.4)

## 2019-05-24 LAB — POCT I-STAT, CHEM 8
BUN: 22 mg/dL — ABNORMAL HIGH (ref 6–20)
Calcium, Ion: 1.16 mmol/L (ref 1.15–1.40)
Chloride: 102 mmol/L (ref 98–111)
Creatinine, Ser: 1.5 mg/dL — ABNORMAL HIGH (ref 0.61–1.24)
Glucose, Bld: 245 mg/dL — ABNORMAL HIGH (ref 70–99)
HCT: 45 % (ref 39.0–52.0)
Hemoglobin: 15.3 g/dL (ref 13.0–17.0)
Potassium: 3.5 mmol/L (ref 3.5–5.1)
Sodium: 140 mmol/L (ref 135–145)
TCO2: 25 mmol/L (ref 22–32)

## 2019-05-24 LAB — CBC
HCT: 41.9 % (ref 39.0–52.0)
HCT: 40.7 % (ref 39.0–52.0)
Hemoglobin: 12.4 g/dL — ABNORMAL LOW (ref 13.0–17.0)
Hemoglobin: 11.7 g/dL — ABNORMAL LOW (ref 13.0–17.0)
MCH: 23 pg — ABNORMAL LOW (ref 26.0–34.0)
MCH: 23.4 pg — ABNORMAL LOW (ref 26.0–34.0)
MCHC: 29.6 g/dL — ABNORMAL LOW (ref 30.0–36.0)
MCHC: 28.7 g/dL — ABNORMAL LOW (ref 30.0–36.0)
MCV: 77.6 fL — ABNORMAL LOW (ref 80.0–100.0)
MCV: 81.2 fL (ref 80.0–100.0)
Platelets: 275 10*3/uL (ref 150–400)
Platelets: 265 10*3/uL (ref 150–400)
RBC: 5.4 MIL/uL (ref 4.22–5.81)
RBC: 5.01 MIL/uL (ref 4.22–5.81)
RDW: 16.2 % — ABNORMAL HIGH (ref 11.5–15.5)
RDW: 16.3 % — ABNORMAL HIGH (ref 11.5–15.5)
WBC: 6.7 10*3/uL (ref 4.0–10.5)
WBC: 10.2 10*3/uL (ref 4.0–10.5)
nRBC: 0 % (ref 0.0–0.2)
nRBC: 0.2 % (ref 0.0–0.2)

## 2019-05-24 LAB — GLUCOSE, CAPILLARY
Glucose-Capillary: 200 mg/dL — ABNORMAL HIGH (ref 70–99)
Glucose-Capillary: 216 mg/dL — ABNORMAL HIGH (ref 70–99)
Glucose-Capillary: 220 mg/dL — ABNORMAL HIGH (ref 70–99)
Glucose-Capillary: 228 mg/dL — ABNORMAL HIGH (ref 70–99)
Glucose-Capillary: 236 mg/dL — ABNORMAL HIGH (ref 70–99)

## 2019-05-24 LAB — TROPONIN I (HIGH SENSITIVITY)
Troponin I (High Sensitivity): 116 ng/L (ref <18)
Troponin I (High Sensitivity): 13071 ng/L (ref <18)

## 2019-05-24 LAB — PROTIME-INR
INR: 1.1 (ref 0.8–1.2)
Prothrombin Time: 13.9 seconds (ref 11.4–15.2)

## 2019-05-24 LAB — APTT: aPTT: 27 seconds (ref 24–36)

## 2019-05-24 LAB — PHOSPHORUS: Phosphorus: 5.5 mg/dL — ABNORMAL HIGH (ref 2.5–4.6)

## 2019-05-24 SURGERY — RIGHT/LEFT HEART CATH AND CORONARY ANGIOGRAPHY
Anesthesia: LOCAL

## 2019-05-24 MED ORDER — STERILE WATER FOR INJECTION IJ SOLN
INTRAMUSCULAR | Status: AC
  Filled 2019-05-24: qty 10

## 2019-05-24 MED ORDER — AMIODARONE HCL IN DEXTROSE 360-4.14 MG/200ML-% IV SOLN
30.0000 mg/h | INTRAVENOUS | Status: DC
  Administered 2019-05-24: 23:00:00 30 mg/h via INTRAVENOUS

## 2019-05-24 MED ORDER — ACETAMINOPHEN 325 MG PO TABS: 650.0000 mg | ORAL_TABLET | ORAL | Status: DC | PRN

## 2019-05-24 MED ORDER — MIDAZOLAM HCL 2 MG/2ML IJ SOLN
INTRAMUSCULAR | Status: AC
  Filled 2019-05-24: qty 2

## 2019-05-24 MED ORDER — SODIUM CHLORIDE 0.9% FLUSH: 3.0000 mL | INTRAVENOUS | Status: DC | PRN

## 2019-05-24 MED ORDER — INSULIN REGULAR(HUMAN) IN NACL 100-0.9 UT/100ML-% IV SOLN
INTRAVENOUS | Status: DC
  Administered 2019-05-24: 22:00:00 4 [IU]/h via INTRAVENOUS
  Filled 2019-05-24: qty 100

## 2019-05-24 MED ORDER — SPIRONOLACTONE 25 MG PO TABS: 25.0000 mg | ORAL_TABLET | Freq: Every day | ORAL | Status: DC

## 2019-05-24 MED ORDER — LABETALOL HCL 5 MG/ML IV SOLN: 10.0000 mg | INTRAVENOUS | Status: AC | PRN

## 2019-05-24 MED ORDER — FENTANYL 2500MCG IN NS 250ML (10MCG/ML) PREMIX INFUSION
0.0000 ug/h | INTRAVENOUS | Status: DC
  Administered 2019-05-24 – 2019-05-26 (×4): 200 ug/h via INTRAVENOUS
  Administered 2019-05-26 – 2019-05-30 (×12): 300 ug/h via INTRAVENOUS
  Administered 2019-06-01: 200 ug/h via INTRAVENOUS
  Administered 2019-06-02 (×2): 275 ug/h via INTRAVENOUS
  Administered 2019-06-02: 250 ug/h via INTRAVENOUS
  Administered 2019-06-03: 04:00:00 225 ug/h via INTRAVENOUS
  Administered 2019-06-03: 20:00:00 150 ug/h via INTRAVENOUS
  Administered 2019-06-04: 200 ug/h via INTRAVENOUS
  Administered 2019-06-06: 150 ug/h via INTRAVENOUS
  Administered 2019-06-08: 17:00:00 25 ug/h via INTRAVENOUS
  Administered 2019-06-10 – 2019-06-12 (×3): 150 ug/h via INTRAVENOUS
  Filled 2019-05-24 (×31): qty 250

## 2019-05-24 MED ORDER — CISATRACURIUM BOLUS VIA INFUSION
0.1000 mg/kg | Freq: Once | INTRAVENOUS | Status: DC
  Filled 2019-05-24: qty 13

## 2019-05-24 MED ORDER — DEXTROSE-NACL 5-0.45 % IV SOLN: INTRAVENOUS | Status: DC

## 2019-05-24 MED ORDER — VERAPAMIL HCL 2.5 MG/ML IV SOLN
INTRAVENOUS | Status: AC
  Filled 2019-05-24: qty 2

## 2019-05-24 MED ORDER — FENTANYL BOLUS VIA INFUSION
50.0000 ug | INTRAVENOUS | Status: DC | PRN
  Administered 2019-06-01 – 2019-06-12 (×10): 50 ug via INTRAVENOUS
  Filled 2019-05-24: qty 50

## 2019-05-24 MED ORDER — PROPOFOL 1000 MG/100ML IV EMUL
25.0000 ug/kg/min | INTRAVENOUS | Status: DC
  Administered 2019-05-24: 19:00:00 25 ug/kg/min via INTRAVENOUS
  Administered 2019-05-24: 35 ug/kg/min via INTRAVENOUS
  Administered 2019-05-25: 40 ug/kg/min via INTRAVENOUS
  Administered 2019-05-25: 35 ug/kg/min via INTRAVENOUS
  Administered 2019-05-25 (×2): 40 ug/kg/min via INTRAVENOUS
  Administered 2019-05-25 (×3): 35 ug/kg/min via INTRAVENOUS
  Administered 2019-05-26 (×3): 40 ug/kg/min via INTRAVENOUS
  Filled 2019-05-24 (×8): qty 100
  Filled 2019-05-24: qty 200
  Filled 2019-05-24 (×2): qty 100

## 2019-05-24 MED ORDER — DEXTROSE 50 % IV SOLN: 0.0000 mL | INTRAVENOUS | Status: DC | PRN

## 2019-05-24 MED ORDER — VERAPAMIL HCL 2.5 MG/ML IV SOLN
INTRAVENOUS | Status: DC | PRN
  Administered 2019-05-24: 10 mL via INTRA_ARTERIAL

## 2019-05-24 MED ORDER — ARTIFICIAL TEARS OPHTHALMIC OINT
1.0000 "application " | TOPICAL_OINTMENT | Freq: Three times a day (TID) | OPHTHALMIC | Status: DC
  Administered 2019-05-25 – 2019-05-30 (×14): 1 via OPHTHALMIC
  Filled 2019-05-24 (×3): qty 3.5

## 2019-05-24 MED ORDER — DOPAMINE-DEXTROSE 3.2-5 MG/ML-% IV SOLN
0.0000 ug/kg/min | INTRAVENOUS | Status: DC
  Administered 2019-05-25: 8 ug/kg/min via INTRAVENOUS
  Administered 2019-05-26: 5 ug/kg/min via INTRAVENOUS
  Filled 2019-05-24 (×3): qty 250

## 2019-05-24 MED ORDER — FENTANYL CITRATE (PF) 100 MCG/2ML IJ SOLN
100.0000 ug | Freq: Once | INTRAMUSCULAR | Status: AC
  Administered 2019-05-24: 20:00:00 100 ug via INTRAVENOUS
  Filled 2019-05-24: qty 2

## 2019-05-24 MED ORDER — FENTANYL CITRATE (PF) 100 MCG/2ML IJ SOLN
INTRAMUSCULAR | Status: AC
  Administered 2019-05-24: 100 ug
  Filled 2019-05-24: qty 2

## 2019-05-24 MED ORDER — FENTANYL CITRATE (PF) 100 MCG/2ML IJ SOLN
INTRAMUSCULAR | Status: DC | PRN
  Administered 2019-05-24: 25 ug via INTRAVENOUS

## 2019-05-24 MED ORDER — AMIODARONE HCL IN DEXTROSE 360-4.14 MG/200ML-% IV SOLN
60.0000 mg/h | INTRAVENOUS | Status: AC
  Administered 2019-05-24: 23:00:00 60 mg/h via INTRAVENOUS
  Filled 2019-05-24: qty 200

## 2019-05-24 MED ORDER — LIVING BETTER WITH HEART FAILURE BOOK: Freq: Once | Status: AC

## 2019-05-24 MED ORDER — SODIUM CHLORIDE 0.9 % IV BOLUS: 500.0000 mL | Freq: Once | INTRAVENOUS | Status: DC

## 2019-05-24 MED ORDER — SODIUM CHLORIDE 0.9 % IV SOLN: INTRAVENOUS | Status: DC

## 2019-05-24 MED ORDER — HEPARIN (PORCINE) IN NACL 1000-0.9 UT/500ML-% IV SOLN
INTRAVENOUS | Status: AC
  Filled 2019-05-24: qty 1000

## 2019-05-24 MED ORDER — AMIODARONE LOAD VIA INFUSION
150.0000 mg | Freq: Once | INTRAVENOUS | Status: DC | PRN
  Filled 2019-05-24: qty 83.34

## 2019-05-24 MED ORDER — HEPARIN SODIUM (PORCINE) 1000 UNIT/ML IJ SOLN
INTRAMUSCULAR | Status: AC
  Filled 2019-05-24: qty 1

## 2019-05-24 MED ORDER — ONDANSETRON HCL 4 MG/2ML IJ SOLN: 4.0000 mg | Freq: Four times a day (QID) | INTRAMUSCULAR | Status: DC | PRN

## 2019-05-24 MED ORDER — ASPIRIN 300 MG RE SUPP: 300.0000 mg | RECTAL | Status: DC

## 2019-05-24 MED ORDER — IOHEXOL 350 MG/ML SOLN
INTRAVENOUS | Status: DC | PRN
  Administered 2019-05-24: 50 mL

## 2019-05-24 MED ORDER — LIDOCAINE HCL (PF) 1 % IJ SOLN
INTRAMUSCULAR | Status: DC | PRN
  Administered 2019-05-24: 5 mL

## 2019-05-24 MED ORDER — HYDRALAZINE HCL 20 MG/ML IJ SOLN: 10.0000 mg | INTRAMUSCULAR | Status: AC | PRN

## 2019-05-24 MED ORDER — NOREPINEPHRINE 4 MG/250ML-% IV SOLN
0.0000 ug/min | INTRAVENOUS | Status: DC
  Administered 2019-05-24: 5 ug/min via INTRAVENOUS
  Administered 2019-05-25: 14 ug/min via INTRAVENOUS
  Administered 2019-05-25: 5 ug/min via INTRAVENOUS
  Administered 2019-05-25: 10 ug/min via INTRAVENOUS
  Administered 2019-05-26 (×2): 7 ug/min via INTRAVENOUS
  Administered 2019-05-27 (×2): 20 ug/min via INTRAVENOUS
  Administered 2019-05-27: 13 ug/min via INTRAVENOUS
  Administered 2019-05-27: 14 ug/min via INTRAVENOUS
  Administered 2019-05-28: 20 ug/min via INTRAVENOUS
  Filled 2019-05-24 (×10): qty 250

## 2019-05-24 MED ORDER — MIDAZOLAM HCL 2 MG/2ML IJ SOLN
INTRAMUSCULAR | Status: DC | PRN
  Administered 2019-05-24: 2 mg via INTRAVENOUS

## 2019-05-24 MED ORDER — HEPARIN (PORCINE) IN NACL 1000-0.9 UT/500ML-% IV SOLN
INTRAVENOUS | Status: DC | PRN
  Administered 2019-05-24 (×2): 500 mL

## 2019-05-24 MED ORDER — SODIUM CHLORIDE 0.9 % IV SOLN
1.0000 ug/kg/min | INTRAVENOUS | Status: DC
  Administered 2019-05-24: 1 ug/kg/min via INTRAVENOUS
  Administered 2019-05-25: 1.5 ug/kg/min via INTRAVENOUS
  Filled 2019-05-24 (×4): qty 20

## 2019-05-24 MED ORDER — VECURONIUM BROMIDE 10 MG IV SOLR: 0.1000 mg/kg | Freq: Once | INTRAVENOUS | Status: DC

## 2019-05-24 MED ORDER — HEPARIN SODIUM (PORCINE) 5000 UNIT/ML IJ SOLN
5000.0000 [IU] | Freq: Three times a day (TID) | INTRAMUSCULAR | Status: DC
  Administered 2019-05-24 – 2019-06-12 (×56): 5000 [IU] via SUBCUTANEOUS
  Filled 2019-05-24 (×54): qty 1

## 2019-05-24 MED ORDER — SODIUM CHLORIDE 0.9% FLUSH
3.0000 mL | Freq: Two times a day (BID) | INTRAVENOUS | Status: DC
  Administered 2019-05-24: 3 mL via INTRAVENOUS

## 2019-05-24 MED ORDER — HEPARIN SODIUM (PORCINE) 1000 UNIT/ML IJ SOLN
INTRAMUSCULAR | Status: DC | PRN
  Administered 2019-05-24: 6000 [IU] via INTRAVENOUS

## 2019-05-24 MED ORDER — POTASSIUM CHLORIDE 10 MEQ/100ML IV SOLN
10.0000 meq | INTRAVENOUS | Status: AC
  Administered 2019-05-24 – 2019-05-25 (×6): 10 meq via INTRAVENOUS
  Filled 2019-05-24 (×5): qty 100

## 2019-05-24 MED ORDER — CHLORHEXIDINE GLUCONATE CLOTH 2 % EX PADS
6.0000 | MEDICATED_PAD | Freq: Every day | CUTANEOUS | Status: DC
  Administered 2019-05-25 – 2019-06-12 (×19): 6 via TOPICAL

## 2019-05-24 MED ORDER — SODIUM CHLORIDE 0.9 % IV SOLN
250.0000 mL | INTRAVENOUS | Status: DC | PRN
  Administered 2019-05-24: 250 mL via INTRAVENOUS

## 2019-05-24 MED ORDER — AMIODARONE HCL IN DEXTROSE 360-4.14 MG/200ML-% IV SOLN
INTRAVENOUS | Status: AC
  Filled 2019-05-24: qty 200

## 2019-05-24 MED ORDER — VECURONIUM BROMIDE 10 MG IV SOLR
INTRAVENOUS | Status: AC
  Filled 2019-05-24: qty 20

## 2019-05-24 MED ORDER — FENTANYL CITRATE (PF) 100 MCG/2ML IJ SOLN
INTRAMUSCULAR | Status: AC
  Filled 2019-05-24: qty 2

## 2019-05-24 MED ORDER — CISATRACURIUM BOLUS VIA INFUSION
0.0500 mg/kg | INTRAVENOUS | Status: DC | PRN
  Filled 2019-05-24: qty 7

## 2019-05-24 MED ORDER — LIDOCAINE HCL (PF) 1 % IJ SOLN
INTRAMUSCULAR | Status: AC
  Filled 2019-05-24: qty 30

## 2019-05-24 MED FILL — Medication: Qty: 1 | Status: AC

## 2019-05-24 SURGICAL SUPPLY — 11 items

## 2019-05-24 NOTE — Progress Notes (Signed)
Responded to Spiritual Care consult to support patient . Upon arrival  Patient was sleep.  When he awake if chaplain services are needed staff will page.  Chaplain available as needed.     Jaclynn Major, [place], [facility], Pager [phone]

## 2019-05-24 NOTE — Procedures (Signed)
Central Venous Catheter Insertion Procedure Note Xavier Doyle [id number] [birth year]  Procedure: Insertion of Central Venous Catheter Indications: Drug and/or fluid administration  Procedure Details Consent: Unable to obtain consent because of emergent medical necessity. Time Out: Verified patient identification, verified procedure, site/side was marked, verified correct patient position, special equipment/implants available, medications/allergies/relevent history reviewed, required imaging and test results available.  Performed  Maximum sterile technique was used including antiseptics, cap, gloves, gown, hand hygiene, mask and sheet. Skin prep: Chlorhexidine; local anesthetic administered A antimicrobial bonded/coated triple lumen catheter was placed in the left internal jugular vein using the Seldinger technique.  Evaluation Blood flow good Complications: No apparent complications Patient did tolerate procedure well. Chest X-ray ordered to verify placement.  CXR: pending.  Lorene Dy 05/06/2019, 6:44 PM

## 2019-05-24 NOTE — H&P (Signed)
PULMONARY / CRITICAL CARE MEDICINE   NAME:  Xavier Doyle, MRN:  [id number], DOB:  [birth year], LOS: 0 ADMISSION DATE:  05/17/2019, CONSULTATION DATE:  1/19 REFERRING MD:  Stark Klein, MD CHIEF COMPLAINT:  Cardiac arrest  BRIEF HISTORY:    64-yo-male who presented for chest pain and found newly diagnosed systolic heart failure. Underwent cath which did not demonstrate significant CAD. On day of discharge, patient had episode torsades followed by PEA. Code blue was called. Patient achieved ROSC after epi x 3, amiodarone and 2 amps bicarb and defibrillation. PCCM consulted.  HISTORY OF PRESENT ILLNESS   Patient admitted for chest pain initially after attempting to prevent his mother from falling. He was found to have newly diagnosed HF Was given ASA and nitro without improvement in symptoms. He was admitted for ACS rule out but his trops remained low and minimally remarkable. Cards was consulted who completed and echo which demonstrated a LVEF of 25-30%.   Patient was experiencing chest pain on admit but this was 3/10 the morning of his arrest. He had also completed a R/L heart cath on 05/21/2019 that did not denote marked stenosis.  Was admitted on losartan/HCTZ combo but was not being treated with a statin. He was also prescribed lasix PRN for hypervolemia.  Per nursing staff patient became unresponsive and was noted to be in V-vib at 16:44. Upon entering the room the patient was noted to be unresponsive. A code blue was called. The patient received epinephrine x3, amiodarone, 2 amps bicarb and defibrillation. There is concern that he was down for ~10 min prior to initiation of CPR. After ~35min of CPR ROSC was obtained. Patient was started on dopamine at 30mcg/kg at that time.   SIGNIFICANT PAST MEDICAL HISTORY   HTN, HLD, prediabetes   SIGNIFICANT EVENTS:  V-vib arrest (torrsades)   STUDIES:   L/R Heart Cath - Minimal stenosis, medical management recommended   CULTURES:  COVD  negative  ANTIBIOTICS:    LINES/TUBES:  ETT 1/19> L CVC 1/19>  CONSULTANTS:  PCCM Cardiology  SUBJECTIVE:  As above  CONSTITUTIONAL: BP (!) 133/94   Pulse 83   Temp 97.8 F (36.6 C) (Oral)   Resp (!) 24   Ht 5\' 8"  (1.727 m)   Wt 122.1 kg   SpO2 98%   BMI 40.93 kg/m   I/O last 3 completed shifts: In: 1063.1 [P.O.:666; I.V.:397.1] Out: -      Vent Mode: PRVC FiO2 (%):  [100 %] 100 % Set Rate:  [16 bmp] 16 bmp Vt Set:  [550 mL] 550 mL PEEP:  [5 cmH20] 5 cmH20 Plateau Pressure:  [24 cmH20] 24 cmH20  Physical Exam: General: Obese, critically ill appearing, unresponsive HENT: [place], AT, ETT in place Eyes: EOMI, no scleral icterus Respiratory: Bilateral rhonchi and occasional scattered wheezing Cardiovascular: RRR, -M/R/G, no JVD GI: BS+, soft, nontender Extremities:-Edema,-tenderness Neuro: Unresponsive Skin: Intact, no rashes or bruising Psych: Unable to assess GU: Foley in place  RESOLVED PROBLEM LIST   ASSESSMENT AND PLAN   V-tach arrest: Cardiogenic Shock: TTM was initiated Stat Echo ordered Continue amiodarone gtt Wean dopamine gtt Trend troponin 116> Cardiology following. Appreciate recommendations   HFrEF: Hold anti-hypertensive agents  Acute hypoxemic respiratory failure  Full vent support ABG  Acute encephalopathy in setting of cardiac arrest Supportive management as above PAD protocol for goal RASS -4 during TTM  Hypokalemia Replete  Best Practice / Goals of Care / Disposition.   DVT PROPHYLAXIS: Heparin NUTRITION: NPO MOBILITY: BR GOALS OF CARE:  Full FAMILY DISCUSSIONS: Will update DISPOSITION: Admit to ICU  LABS  Glucose Recent Labs  Lab 05/13/2019 1151  GLUCAP 104*    BMET Recent Labs  Lab 05/23/2019 1110 06/01/2019 1110 05/23/19 0728 05/23/19 0728 05/16/2019 0708 06/03/2019 1123 05/14/2019 1130  NA 141   < > 139   < > 140 140 140  K 3.5   < > 3.7   < > 4.0 3.7 3.6  CL 107  --  103  --  104  --   --   CO2 25  --  27   --  26  --   --   BUN 12  --  12  --  13  --   --   CREATININE 1.20  --  0.94  --  0.90  --   --   GLUCOSE 114*  --  97  --  107*  --   --    < > = values in this interval not displayed.    Liver Enzymes No results for input(s): AST, ALT, ALKPHOS, BILITOT, ALBUMIN in the last 168 hours.  Electrolytes Recent Labs  Lab 06/04/2019 1110 05/23/19 0728 05/20/2019 0708  CALCIUM 8.7* 8.7* 9.1    CBC Recent Labs  Lab 05/08/2019 1110 06/03/2019 1110 05/23/19 0728 05/23/19 0728 06/01/2019 0708 06/03/2019 1123 06/03/2019 1130  WBC 6.9  --  6.3  --  6.7  --   --   HGB 11.8*   < > 11.9*   < > 12.4* 13.6 12.6*  HCT 40.6   < > 40.2   < > 41.9 40.0 37.0*  PLT 273  --  259  --  275  --   --    < > = values in this interval not displayed.    ABG Recent Labs  Lab 06/03/2019 1130  PHART 7.384  PCO2ART 43.9  PO2ART 81.0*    Coag's No results for input(s): APTT, INR in the last 168 hours.  Sepsis Markers No results for input(s): LATICACIDVEN, PROCALCITON, O2SATVEN in the last 168 hours.  Cardiac Enzymes No results for input(s): TROPONINI, PROBNP in the last 168 hours.  PAST MEDICAL HISTORY :   He  has a past medical history of Allergy, Anxiety, Arthritis, Asthma, Depression, Heart murmur, Hyperlipidemia, Hypertension, and Osteoporosis.  PAST SURGICAL HISTORY:  He  has a past surgical history that includes No past surgeries and RIGHT/LEFT HEART CATH AND CORONARY ANGIOGRAPHY (N/A, 05/12/2019).  No Known Allergies  No current facility-administered medications on file prior to encounter.   Current Outpatient Medications on File Prior to Encounter  Medication Sig  . furosemide (LASIX) 40 MG tablet Take 40 mg by mouth daily as needed for fluid.  Marland Kitchen losartan-hydrochlorothiazide (HYZAAR) 100-25 MG tablet Take 1 tablet by mouth daily.   Marland Kitchen OVER THE COUNTER MEDICATION Neutragenix daily  . OVER THE COUNTER MEDICATION Take 1 tablet by mouth daily. Keto diet pill   . PROAIR HFA 108 (90 Base) MCG/ACT  inhaler Inhale 2 puffs into the lungs every 6 (six) hours as needed for wheezing.   . tamsulosin (FLOMAX) 0.4 MG CAPS capsule Take 0.4 mg by mouth.    FAMILY HISTORY:   His family history is negative for Colon cancer, Colon polyps, Esophageal cancer, Rectal cancer, and Stomach cancer.  SOCIAL HISTORY:  He  reports that he has quit smoking. He has never used smokeless tobacco. He reports current alcohol use. He reports current drug use. Drug: "Crack" cocaine.  REVIEW OF SYSTEMS:  Unable to obtain due to critical illness

## 2019-05-24 NOTE — Procedures (Signed)
Patient Name: Xavier Doyle  MRN: [id number]  Epilepsy Attending: Lora Havens  Referring Physician/Provider: Merlene Laughter, NP Date: 06/02/2019  Duration: 21.03 mins  Patient history: 59yo s/p cardiac arrest now on TTM. EEG to evaluate for seizure  Level of alertness: comatose  AEDs during EEG study: Propofol  Technical aspects: This EEG study was done with scalp electrodes positioned according to the 10-20 International system of electrode placement. Electrical activity was acquired at a sampling rate of 500Hz  and reviewed with a high frequency filter of 70Hz  and a low frequency filter of 1Hz . EEG data were recorded continuously and digitally stored.   DESCRIPTION: EEG showed continuous generalized background suppression. Intermittent generalized high amplitude sharply contoured 4-6zh theta slowing was also noted. Hyperventilation and photic stimulation were not performed.  ABNORMALITY - Background suppression, generalized  - Intermittent slow, generalized   IMPRESSION: This study is showed evidence of profound diffuse encephalopathy, non specific to etiology. No seizures or definite epileptiform discharges were seen throughout the recording.   Xavier Doyle [name]

## 2019-05-24 NOTE — H&P (View-Only) (Signed)
The patient has been seen in conjunction with Reino Bellis, NP. All aspects of care have been considered and discussed. The patient has been personally interviewed, examined, and all clinical data has been reviewed.   Echocardiogram yesterday showed surprisingly low systolic function, EF less than 35%.  Mild cavity enlargement and moderate to severe concentric hypertrophy suggest hypertensive cardiovascular disease with end-organ damage due to poor control of blood pressure.  Left and right heart cath with coronary angiography is appropriate given the patient's risk factors.  This will help exclude myocardial ischemia as a basis for reduced EF.  Guideline directed therapy for reduced LV systolic function should be instituted after cath depending upon status of coronaries (beta-blocker therapy (may not be possible because of reactive airways disease); ARB or Arni; MRA; an SGLT2).  Social determinants of health will significantly impact appropriate therapy in this patient.  Care management should be involved early.   Progress Note  Patient Name: Xavier Doyle Date of Encounter: 05/19/2019  Primary Cardiologist: Sinclair Grooms, MD   Subjective   Feeling well this morning.   Inpatient Medications    Scheduled Meds: . aspirin EC  81 mg Oral Daily  . atorvastatin  80 mg Oral q1800  . enoxaparin (LOVENOX) injection  40 mg Subcutaneous Q24H  . hydrochlorothiazide  25 mg Oral Daily  . losartan  100 mg Oral Daily  . metoprolol succinate  50 mg Oral Daily  . sodium chloride flush  3 mL Intravenous Q12H  . tamsulosin  0.4 mg Oral QPC breakfast   Continuous Infusions: . sodium chloride    . sodium chloride 1 mL/kg/hr (05/17/2019 0532)   PRN Meds: sodium chloride, acetaminophen, albuterol, nitroGLYCERIN, ondansetron (ZOFRAN) IV, sodium chloride flush   Vital Signs    Vitals:   05/23/19 1549 05/23/19 2128 05/15/2019 0643 05/27/2019 0738  BP: 131/89 140/81 (!) 125/108 (!) 137/95    Pulse: 67 74 70   Resp:  20 18   Temp: 97.8 F (36.6 C) 97.7 F (36.5 C) (!) 97.5 F (36.4 C)   TempSrc: Oral Oral Oral   SpO2:  99% 98% 98%  Weight:   122.1 kg   Height:        Intake/Output Summary (Last 24 hours) at 05/13/2019 0917 Last data filed at 05/21/2019 0540 Gross per 24 hour  Intake 841.05 ml  Output --  Net 841.05 ml   Last 3 Weights 05/26/2019 05/23/2019 06/03/2019  Weight (lbs) 269 lb 3.2 oz 270 lb 12.8 oz 271 lb 3.2 oz  Weight (kg) 122.108 kg 122.834 kg 123.016 kg      Telemetry    SR - Personally Reviewed  ECG    No new tracing this morning.  Physical Exam  Pleasant AAM, sitting up in side of the bed.  GEN: No acute distress.   Neck: No JVD Cardiac: RRR, no murmurs, rubs, or gallops.  Respiratory: Clear to auscultation bilaterally. GI: Soft, nontender, non-distended  MS: No edema; No deformity. Neuro:  Nonfocal  Psych: Normal affect   Labs    High Sensitivity Troponin:   Recent Labs  Lab 06/03/2019 1110 05/11/2019 1251 05/23/19 0728  TROPONINIHS 52* 54* 58*      Chemistry Recent Labs  Lab 05/21/2019 1110 05/23/19 0728 05/14/2019 0708  NA 141 139 140  K 3.5 3.7 4.0  CL 107 103 104  CO2 25 27 26   GLUCOSE 114* 97 107*  BUN 12 12 13   CREATININE 1.20 0.94 0.90  CALCIUM 8.7* 8.7*  9.1  GFRNONAA >60 >60 >60  GFRAA >60 >60 >60  ANIONGAP 9 9 10      Hematology Recent Labs  Lab 05/12/2019 1110 05/23/19 0728 05/27/2019 0708  WBC 6.9 6.3 6.7  RBC 5.12 5.18 5.40  HGB 11.8* 11.9* 12.4*  HCT 40.6 40.2 41.9  MCV 79.3* 77.6* 77.6*  MCH 23.0* 23.0* 23.0*  MCHC 29.1* 29.6* 29.6*  RDW 16.4* 16.4* 16.2*  PLT 273 259 275    BNPNo results for input(s): BNP, PROBNP in the last 168 hours.   DDimer  Recent Labs  Lab 05/21/2019 1850  DDIMER 0.47     Radiology    DG Chest 2 View  Result Date: 05/16/2019 CLINICAL DATA:  Chest pain, shortness of breath. EXAM: CHEST - 2 VIEW COMPARISON:  None. FINDINGS: Mild cardiomegaly is noted. No pneumothorax  or significant pleural effusion is noted. Lungs are clear. Bony thorax is unremarkable. IMPRESSION: No active cardiopulmonary disease. Electronically Signed   By: Marijo Conception M.D.   On: 05/28/2019 11:33   ECHOCARDIOGRAM COMPLETE  Result Date: 05/23/2019   ECHOCARDIOGRAM REPORT   Patient Name:   Xavier Doyle Date of Exam: 05/23/2019 Medical Rec #:  [id number]   Height:       68.0 in Accession #:    [id number]  Weight:       270.8 lb Date of Birth:  05-Dec-1960   BSA:          2.32 m Patient Age:    59 years    BP:           153/94 mmHg Patient Gender: M           HR:           77 bpm. Exam Location:  Inpatient Procedure: 2D Echo, Cardiac Doppler and Color Doppler Indications:    Chest pain  History:        Patient has no prior history of Echocardiogram examinations.                 Signs/Symptoms:Chest Pain; Risk Factors:Hypertension and                 Dyslipidemia. Alcohol abuse, Cocaine abuse.  Sonographer:    Dustin Flock Referring Phys: [id number] Darreld Mclean  Sonographer Comments: Patient is morbidly obese. IMPRESSIONS  1. Left ventricular ejection fraction, by visual estimation, is 25 to 30%. The left ventricle has severely decreased function. There is moderately increased left ventricular hypertrophy.  2. Left ventricular diastolic parameters are consistent with Grade I diastolic dysfunction (impaired relaxation).  3. The left ventricle demonstrates global hypokinesis.  4. Global right ventricle has mildly reduced systolic function.The right ventricular size is normal. No increase in right ventricular wall thickness.  5. Left atrial size was mildly dilated.  6. Right atrial size was mildly dilated.  7. The mitral valve is normal in structure. Mild mitral valve regurgitation. No evidence of mitral stenosis.  8. The tricuspid valve is normal in structure. Tricuspid valve regurgitation is not demonstrated.  9. The aortic valve is tricuspid. Aortic valve regurgitation is mild. Mild aortic valve  sclerosis without stenosis. 10. The inferior vena cava is normal in size with greater than 50% respiratory variability, suggesting right atrial pressure of 3 mmHg. 11. TR signal is inadequate for assessing pulmonary artery systolic pressure. FINDINGS  Left Ventricle: Left ventricular ejection fraction, by visual estimation, is 25 to 30%. The left ventricle has severely decreased function. The left ventricle demonstrates global hypokinesis. The  left ventricular internal cavity size was the left ventricle is normal in size. There is moderately increased left ventricular hypertrophy. Left ventricular diastolic parameters are consistent with Grade I diastolic dysfunction (impaired relaxation). Right Ventricle: The right ventricular size is normal. No increase in right ventricular wall thickness. Global RV systolic function is has mildly reduced systolic function. Left Atrium: Left atrial size was mildly dilated. Right Atrium: Right atrial size was mildly dilated Pericardium: There is no evidence of pericardial effusion. Mitral Valve: The mitral valve is normal in structure. Mild mitral valve regurgitation. No evidence of mitral valve stenosis by observation. Tricuspid Valve: The tricuspid valve is normal in structure. Tricuspid valve regurgitation is not demonstrated. Aortic Valve: The aortic valve is tricuspid. Aortic valve regurgitation is mild. Mild aortic valve sclerosis is present, with no evidence of aortic valve stenosis. Pulmonic Valve: The pulmonic valve was normal in structure. Pulmonic valve regurgitation is not visualized. Pulmonic regurgitation is not visualized. Aorta: The aortic root is normal in size and structure. Venous: The inferior vena cava is normal in size with greater than 50% respiratory variability, suggesting right atrial pressure of 3 mmHg. IAS/Shunts: No atrial level shunt detected by color flow Doppler.  LEFT VENTRICLE PLAX 2D LVIDd:         5.52 cm  Diastology LVIDs:         4.56 cm  LV e'  lateral:   5.87 cm/s LV PW:         1.43 cm  LV E/e' lateral: 14.5 LV IVS:        1.49 cm  LV e' medial:    5.55 cm/s LVOT diam:     2.10 cm  LV E/e' medial:  15.4 LV SV:         53 ml LV SV Index:   21.44 LVOT Area:     3.46 cm  RIGHT VENTRICLE RV Basal diam:  2.62 cm RV S prime:     9.68 cm/s TAPSE (M-mode): 3.4 cm LEFT ATRIUM           Index       RIGHT ATRIUM           Index LA diam:      3.80 cm 1.63 cm/m  RA Area:     18.90 cm LA Vol (A2C): 33.0 ml 14.20 ml/m RA Volume:   53.00 ml  22.80 ml/m LA Vol (A4C): 83.1 ml 35.75 ml/m  AORTIC VALVE LVOT Vmax:   83.30 cm/s LVOT Vmean:  55.900 cm/s LVOT VTI:    0.160 m  AORTA Ao Root diam: 3.10 cm MITRAL VALVE MV Area (PHT): 4.68 cm             SHUNTS MV PHT:        46.98 msec           Systemic VTI:  0.16 m MV Decel Time: 162 msec             Systemic Diam: 2.10 cm MV E velocity: 85.30 cm/s 103 cm/s MV A velocity: 67.30 cm/s 70.3 cm/s MV E/A ratio:  1.27       1.5  Loralie Champagne MD Electronically signed by Loralie Champagne MD Signature Date/Time: 05/23/2019/3:37:26 PM    Final     Cardiac Studies   TTE: 05/23/19  IMPRESSIONS    1. Left ventricular ejection fraction, by visual estimation, is 25 to 30%. The left ventricle has severely decreased function. There is moderately increased left ventricular hypertrophy.  2. Left  ventricular diastolic parameters are consistent with Grade I diastolic dysfunction (impaired relaxation).  3. The left ventricle demonstrates global hypokinesis.  4. Global right ventricle has mildly reduced systolic function.The right ventricular size is normal. No increase in right ventricular wall thickness.  5. Left atrial size was mildly dilated.  6. Right atrial size was mildly dilated.  7. The mitral valve is normal in structure. Mild mitral valve regurgitation. No evidence of mitral stenosis.  8. The tricuspid valve is normal in structure. Tricuspid valve regurgitation is not demonstrated.  9. The aortic valve is tricuspid.  Aortic valve regurgitation is mild. Mild aortic valve sclerosis without stenosis. 10. The inferior vena cava is normal in size with greater than 50% respiratory variability, suggesting right atrial pressure of 3 mmHg. 11. TR signal is inadequate for assessing pulmonary artery systolic pressure.  Patient Profile     59 y.o. male with a history of hypertension, hyperlipidemia, asthma, arthritis, anxiety/depression, alcohol abuse, and prior cocaine abuse but no known cardiac history who was seen for the evaluation of chest pain at the request of Dr. Andria Frames.  Assessment & Plan    1. Chest Pain: Patient presented with sudden onset of chest pain that resolved with Nitro. EKG showed T wave inversions in inferior leads and leads V5-V6. No prior tracing for comparison. - High-sensitivity troponin minimally elevated and flat at  52 >> 54 >> 58. Initial plan was for coronary CT but echo came back abnormal with EF of 25-30% with global hypokinesis. Now planned for [facility] today.  - on asa, statin, BB, ARB. Will stop HCTZ and switch to spiro.   2. Acute combined HF: EF noted at 25-30% with global hypokinesis. No signs of volume overload on exam. Medication changes as above. Planned for [facility] today to define etiology. -- needs diet education, eats lots of fatty fried foods.   3. Hypertension: stable with current medication regimen.  - Continue home Losartan 100mg  daily and HCTZ 25mg  daily.  - Started on Toprol-XL 50mg , and continued on losartan 100mg  daily. Switching HCTZ to spiro as above.   4. Hyperlipidemia: Lipid panel this admission: Total Cholesterol 188, Triglycerides 145, HDL 39, LDL 120. Started on high dose statin.   5. Pre-Diabetes: Hemoglobin A1c 6.4 this admission consistent with pre-diabetes. - Recommend lifestyle modifications.   6. Suspected Sleep Apnea:  Patient reports snoring and daytime fatigue. - BMI 41.17. Suspect he has sleep apnea but has never had a sleep study. - Recommend  outpatient sleep study.  6. Tobacco Use - Complete cessation recommended.   For questions or updates, please contact [place] Please consult [email or web address] for contact info under   Signed, Reino Bellis, NP  05/10/2019, 9:17 AM

## 2019-05-24 NOTE — Progress Notes (Addendum)
CARDIOLOGY Cardiac Resuscitation Note   Packed around 1640 was notified that the patient was undergoing CPR.  Upon arrival, CPR was in progress and at least 1 round of epinephrine and  have been given.  The patient was not yet intubated.  According to his nurse around 1615, he was given a medication, was discussing his diet, and had no complaints.  He was questioning if he could potentially go home.  Retrospective review of telemetry demonstrated R on T and subsequent [place] at [phone]. Over ?seconds to minutes the VT-->VF--> asystole.  While reviewing the stored telemetry data suddenly disappeared from the computer console and was not retrievable as the patient was being transported to 2 H.  Please see resuscitation document for the exact time that CPR was started.  Standard ACLS protocol was followed.  Defibrillation occurred x2.  Epinephrine, and bicarbonate administered.  Eventual intubation and patient was ventilated manually.  Amiodarone 150 mg x 2 was administered.  An amiodarone drip was started.  IV dopamine was started rather than Levophed to improve bradycardic heart rate and provide both pressor and inotropic support.  ROSC occurred after approximately 15 minutes of CPR.  Critical care Time 35 minutes.

## 2019-05-24 NOTE — Progress Notes (Signed)
Patient's contact reached by phone and a nurse has already updated her on patient's status. She had no further questions at this time.  

## 2019-05-24 NOTE — Progress Notes (Signed)
EEG complete - results pending 

## 2019-05-24 NOTE — Plan of Care (Signed)
  Problem: Health Behavior/Discharge Planning: Goal: Ability to manage health-related needs will improve Outcome: Progressing   Problem: Clinical Measurements: Goal: Will remain free from infection Outcome: Progressing Goal: Diagnostic test results will improve Outcome: Progressing Goal: Cardiovascular complication will be avoided Outcome: Progressing

## 2019-05-24 NOTE — Code Documentation (Signed)
  Patient Name: Xavier Doyle   MRN: [id number]   Date of Birth/ Sex: 1960/12/13 , male      Admission Date: 05/30/2019  Attending Provider: Margaretha Seeds, MD  Primary Diagnosis: Chest pain with high risk for cardiac etiology   Indication: Pt was in his usual state of health until this PM, when he was noted via telemetry to be in torsades, nursing staff notified. Code blue was subsequently called. At the time of arrival on scene, ACLS protocol was underway.   Technical Description:  - CPR performance duration:  ~17 minutes  - Was defibrillation or cardioversion used? Yes   - Was external pacer placed? No  - Was patient intubated pre/post CPR? Yes   Medications Administered: Y = Yes; Blank = No Amiodarone  Y  Atropine  N  Calcium  N  Epinephrine  Y  Lidocaine  N  Magnesium  N  Norepinephrine  N  Phenylephrine  N  Sodium bicarbonate  Y  Vasopressin  N   Post CPR evaluation:  - Final Status - Was patient successfully resuscitated ? Yes - What is current rhythm?  NSR - What is current hemodynamic status? HDS  Miscellaneous Information:  - Labs sent, including: Phos, Magnesium, CBC, ABG, APTT, Protime INR, Troponin, BMP, Calcium  - Primary team notified?  Yes  - Family Notified? Yes  - Additional notes/ transfer status: Transferred to Cardiac ICU  2H18     Stark Klein, MD  05/13/2019, 6:09 PM

## 2019-05-24 NOTE — Interval H&P Note (Signed)
Cath Lab Visit (complete for each Cath Lab visit)  Clinical Evaluation Leading to the Procedure:   ACS: Yes.    Non-ACS:    Anginal Classification: CCS IV  Anti-ischemic medical therapy: Minimal Therapy (1 class of medications)  Non-Invasive Test Results: High-risk stress test findings: cardiac mortality >3%/year low EF  Prior CABG: No previous CABG      History and Physical Interval Note:  05/21/2019 10:29 AM  Xavier Doyle  has presented today for surgery, with the diagnosis of new reduced ef.  The various methods of treatment have been discussed with the patient and family. After consideration of risks, benefits and other options for treatment, the patient has consented to  Procedure(s): RIGHT/LEFT HEART CATH AND CORONARY ANGIOGRAPHY (N/A) as a surgical intervention.  The patient's history has been reviewed, patient examined, no change in status, stable for surgery.  I have reviewed the patient's chart and labs.  Questions were answered to the patient's satisfaction.     Xavier Doyle

## 2019-05-24 NOTE — TOC Progression Note (Signed)
Transition of Care [facility]) - Progression Note    Patient Details  Name: Xavier Doyle MRN: [id number] Date of Birth: 12-01-1960  Transition of Care [facility]) CM/SW [place], [place] Phone Number: 05/07/2019, 4:09 PM  Clinical Narrative:     CSW met with pt at bedside.  Pt needs assistance with housing. Pt gave verbal consent for referral. CSW made a referral through [employer] for housing.          Expected Discharge Plan and Services                                                 Social Determinants of Health (SDOH) Interventions    Readmission Risk Interventions No flowsheet data found.

## 2019-05-24 NOTE — Progress Notes (Signed)
vLTM started neurology notified  Event button tested RN instructed on use on event button

## 2019-05-24 NOTE — Progress Notes (Signed)
Patient transported on the ventilator from 6E04 to 2H18 with no apparent complications.

## 2019-05-24 NOTE — Anesthesia Procedure Notes (Signed)
Procedure Name: Intubation Date/Time: 05/16/2019 4:50 PM Performed by: Griffin Dakin, CRNA Pre-anesthesia Checklist: Patient identified, Emergency Drugs available, Suction available and Patient being monitored Patient Re-evaluated:Patient Re-evaluated prior to induction Oxygen Delivery Method: Ambu bag Preoxygenation: Pre-oxygenation with 100% oxygen Ventilation: Mask ventilation without difficulty Laryngoscope Size: Glidescope and 3 Grade View: Grade II Tube type: Oral Tube size: 7.5 mm Number of attempts: 1 Airway Equipment and Method: Video-laryngoscopy and Rigid stylet Placement Confirmation: ETT inserted through vocal cords under direct vision,  positive ETCO2 and breath sounds checked- equal and bilateral Secured at: 23 cm Tube secured with: Tape Dental Injury: Teeth and Oropharynx as per pre-operative assessment

## 2019-05-24 NOTE — Evaluation (Signed)
Physical Therapy Evaluation Patient Details Name: Xavier Doyle MRN: [id number] DOB: January 09, 1961 Today's Date: 05/27/2019   History of Present Illness  59yo male c/o of CP after stopping his mother from falling, admitted for r/o of ACS and elevated high sensitivity troponins. Wells score 0, so no significant workup done for PE. PMH anxiety, HLD, HTN, obesity  Clinical Impression   Patient received in bed, cooperative but slightly annoyed with PT stating "I just got up with someone in purple!!". Able to complete all functional mobility with I-mod(I) and reports he is generally at his functional baseline, does display some mild SOB with gait extended periods. He was left sitting at EOB with all needs met per his request this morning. PT signing off for now as he is at his functional baseline- thank you for the referral!     Follow Up Recommendations Other (comment)(cardiopulm OP rehab)    Equipment Recommendations  None recommended by PT    Recommendations for Other Services       Precautions / Restrictions Precautions Precautions: Fall;Other (comment) Precaution Comments: obesity, chronic B knee pain Restrictions Weight Bearing Restrictions: No      Mobility  Bed Mobility Overal bed mobility: Independent                Transfers Overall transfer level: Independent Equipment used: None             General transfer comment: wide BOS, distant S for safety, no physical assist given  Ambulation/Gait Ambulation/Gait assistance: Modified independent (Device/Increase time) Gait Distance (Feet): 200 Feet Assistive device: IV Pole Gait Pattern/deviations: Step-through pattern;Wide base of support;Trunk flexed Gait velocity: functional   General Gait Details: wide BOS and use of IV pole, no significant giat impairment or LOB with mobility but did become mildly SOB; reports he is at his baseline for mobility  Stairs            Wheelchair Mobility    Modified  Rankin (Stroke Patients Only)       Balance Overall balance assessment: No apparent balance deficits (not formally assessed)                                           Pertinent Vitals/Pain Pain Assessment: No/denies pain    Home Living Family/patient expects to be discharged to:: Private residence Living Arrangements: Parent(sounds like he is caretaker for mother) Available Help at Discharge: Family;Available PRN/intermittently Type of Home: House Home Access: Stairs to enter Entrance Stairs-Rails: None Entrance Stairs-Number of Steps: 2 Home Layout: One level Home Equipment: Cane - single point Additional Comments: uses SPC occasionally when knees are acting up    Prior Function Level of Independence: Independent with assistive device(s)               Hand Dominance        Extremity/Trunk Assessment   Upper Extremity Assessment Upper Extremity Assessment: Defer to OT evaluation    Lower Extremity Assessment Lower Extremity Assessment: Overall WFL for tasks assessed    Cervical / Trunk Assessment Cervical / Trunk Assessment: Kyphotic  Communication   Communication: No difficulties  Cognition Arousal/Alertness: Awake/alert Behavior During Therapy: WFL for tasks assessed/performed;Flat affect Overall Cognitive Status: Within Functional Limits for tasks assessed  General Comments: follows commands well and is appropriate with PT, but seems a bit annoyed with therapist this morning      General Comments General comments (skin integrity, edema, etc.): mild SOB with mobility    Exercises     Assessment/Plan    PT Assessment Patent does not need any further PT services  PT Problem List         PT Treatment Interventions      PT Goals (Current goals can be found in the Care Plan section)  Acute Rehab PT Goals Patient Stated Goal: go home when able PT Goal Formulation: With patient Time  For Goal Achievement: 06/07/19 Potential to Achieve Goals: Good    Frequency     Barriers to discharge        Co-evaluation               AM-PAC PT "6 Clicks" Mobility  Outcome Measure Help needed turning from your back to your side while in a flat bed without using bedrails?: None Help needed moving from lying on your back to sitting on the side of a flat bed without using bedrails?: None Help needed moving to and from a bed to a chair (including a wheelchair)?: None Help needed standing up from a chair using your arms (e.g., wheelchair or bedside chair)?: None Help needed to walk in hospital room?: None Help needed climbing 3-5 steps with a railing? : A Little 6 Click Score: 23    End of Session   Activity Tolerance: Patient tolerated treatment well Patient left: in bed;with call bell/phone within reach   PT Visit Diagnosis: Other abnormalities of gait and mobility (R26.89)    Time: 2341-4436 PT Time Calculation (min) (ACUTE ONLY): 10 min   Charges:   PT Evaluation $PT Eval Low Complexity: 1 Low         Windell Norfolk, DPT, PN1   Supplemental Physical Therapist [place]    Pager [phone] Acute Rehab Office [phone]

## 2019-05-24 NOTE — Procedures (Deleted)
Void  

## 2019-05-24 NOTE — Procedures (Signed)
Arterial Catheter Insertion Procedure Note Xavier Doyle [id number] [birth year]  Procedure: Insertion of Arterial Catheter  Indications: Blood pressure monitoring  Procedure Details Consent: Unable to obtain consent because of emergent medical necessity. Time Out: Verified patient identification, verified procedure, site/side was marked, verified correct patient position, special equipment/implants available, medications/allergies/relevent history reviewed, required imaging and test results available.  Performed  Maximum sterile technique was used including antiseptics, cap, gloves, gown, hand hygiene, mask and sheet. Skin prep: Chlorhexidine; local anesthetic administered 22 gauge catheter was inserted into left radial artery using the Seldinger technique. ULTRASOUND GUIDANCE USED: YES Evaluation Blood flow good; BP tracing good. Complications: No apparent complications.   Levora Dredge 05/21/2019

## 2019-05-24 NOTE — Progress Notes (Addendum)
Family Medicine Teaching Service Daily Progress Note Intern Pager: [phone]  Patient name: Xavier Doyle Medical record number: [id number] Date of birth: 04-24-1961 Age: 59 y.o. Gender: male  Primary Care Provider: System, Pcp Not In Consultants: None Code Status: Full Code   Pt Overview and Major Events to Date:  1/17: admitted for ACS rule out, trop 52>54 1/18: Troponins trended up to 46, echo shows newly diagnosed HF 25-30%  1/19: right and left heart cath   Assessment and Plan: Xavier Doyle is a 59 y.o. male presenting with chest pain and SOB. PMH is significant for HTN, HLD and asthma.    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 reports chest pain has decreased to 3/10.  -Cardiology consulted, planning right and left heart cath today 1/19  -cont Metoprolol 50  -spironolactone  -will follow up findings at end of procedure  -monitor BMP  -stop trend of troponin   HTN  Home medication includes losartan-HCTZ 100-25 daily.  Blood pressure elevated during this admission but decreasing. Improved at 140/81 today. Added Metoprolol 50mg  1/18. Home meds also include Lasix 40 as needed for fluid.   -Continue losartan 100mg  daily  -discontinued HCTZ per cardiology  -started on Spironolactone  -Monitor blood pressure with vitals -continue Metoprolol 50mg  daily   Asthma  Patient reportedly uses albuterol inhaler at home prior to and in between weight training sessions. No SOB or wheezing at this time. -Continue home albuterol inhaler PRN  AKI, resolved  Cr initially 1.2 on admission, improved to 0.9. Unsure of patient's baseline Cr.  -monitor Cr with BMP   Alcohol use, Remote Hx of Cocaine Use  Drinks 1 quart of moonshine per week. Denies previous hospitalizations for withdrawal.  Denies ever having withdrawal symptoms. Patient reports being abstinent from cocaine use for many years. -Monitor CIWA, scores have been 0, will discontinue  HLD Patient noted to have  elevated LDL at 120, cholesterol 188, HDL low at 39, triglycerides 145.  List of home medications did not include statin on admission. -We will suggest outpatient follow-up of cholesterol and starting statin -ASCVD Risk 9.4% 10 year risk of cardiovascular event  FEN/GI: NPO at midnight in prep for heart cath  Prophylaxis: Lovenox 40 mg  Disposition: discharge pending cardiology evaluation and recs  Subjective:  Patient states that chest pain has greatly decreased and he is a little nervous about the heart cath today otherwise has no new symptoms and denies SOB. Patient showed pictures of his scooter and appears to be in good spirits.   Objective: Temp:  [97.5 F (36.4 C)-97.8 F (36.6 C)] 97.5 F (36.4 C) (01/19 0643) Pulse Rate:  [67-77] 70 (01/19 0643) Resp:  [18-20] 18 (01/19 0643) BP: (125-153)/(81-108) 137/95 (01/19 0738) SpO2:  [98 %-99 %] 98 % (01/19 0738) Weight:  [122.1 kg] 122.1 kg (01/19 0643)  Physical Exam: General: obese male lying in bed watching television in NAD  Cardiovascular: murmur appreciated, appears to be systolic, no friction rubs  Respiratory: CTAB without wheezing, SORA  Abdomen: obese abdomen, non tender to palpation  Extremities: no LE edema   Laboratory: Recent Labs  Lab 05/31/2019 1110 05/23/19 0728 05/31/2019 0708  WBC 6.9 6.3 6.7  HGB 11.8* 11.9* 12.4*  HCT 40.6 40.2 41.9  PLT 273 259 275   Recent Labs  Lab 05/17/2019 1110 05/23/19 0728 05/23/2019 0708  NA 141 139 140  K 3.5 3.7 4.0  CL 107 103 104  CO2 25 27 26   BUN 12  12 13  CREATININE 1.20 0.94 0.90  CALCIUM 8.7* 8.7* 9.1  GLUCOSE 114* 97 107*    Imaging/Diagnostic Tests: ECHOCARDIOGRAM COMPLETE  Result Date: 05/23/2019   ECHOCARDIOGRAM REPORT   Patient Name:   Xavier Doyle Date of Exam: 05/23/2019 Medical Rec #:  [id number]   Height:       68.0 in Accession #:    [id number]  Weight:       270.8 lb Date of Birth:  [birth year]   BSA:          2.32 m Patient Age:    12 years    BP:            153/94 mmHg Patient Gender: M           HR:           77 bpm. Exam Location:  Inpatient Procedure: 2D Echo, Cardiac Doppler and Color Doppler Indications:    Chest pain  History:        Patient has no prior history of Echocardiogram examinations.                 Signs/Symptoms:Chest Pain; Risk Factors:Hypertension and                 Dyslipidemia. Alcohol abuse, Cocaine abuse.  Sonographer:    Dustin Flock Referring Phys: [id number] Darreld Mclean  Sonographer Comments: Patient is morbidly obese. IMPRESSIONS  1. Left ventricular ejection fraction, by visual estimation, is 25 to 30%. The left ventricle has severely decreased function. There is moderately increased left ventricular hypertrophy.  2. Left ventricular diastolic parameters are consistent with Grade I diastolic dysfunction (impaired relaxation).  3. The left ventricle demonstrates global hypokinesis.  4. Global right ventricle has mildly reduced systolic function.The right ventricular size is normal. No increase in right ventricular wall thickness.  5. Left atrial size was mildly dilated.  6. Right atrial size was mildly dilated.  7. The mitral valve is normal in structure. Mild mitral valve regurgitation. No evidence of mitral stenosis.  8. The tricuspid valve is normal in structure. Tricuspid valve regurgitation is not demonstrated.  9. The aortic valve is tricuspid. Aortic valve regurgitation is mild. Mild aortic valve sclerosis without stenosis. 10. The inferior vena cava is normal in size with greater than 50% respiratory variability, suggesting right atrial pressure of 3 mmHg. 11. TR signal is inadequate for assessing pulmonary artery systolic pressure. FINDINGS  Left Ventricle: Left ventricular ejection fraction, by visual estimation, is 25 to 30%. The left ventricle has severely decreased function. The left ventricle demonstrates global hypokinesis. The left ventricular internal cavity size was the left ventricle is normal in size. There  is moderately increased left ventricular hypertrophy. Left ventricular diastolic parameters are consistent with Grade I diastolic dysfunction (impaired relaxation). Right Ventricle: The right ventricular size is normal. No increase in right ventricular wall thickness. Global RV systolic function is has mildly reduced systolic function. Left Atrium: Left atrial size was mildly dilated. Right Atrium: Right atrial size was mildly dilated Pericardium: There is no evidence of pericardial effusion. Mitral Valve: The mitral valve is normal in structure. Mild mitral valve regurgitation. No evidence of mitral valve stenosis by observation. Tricuspid Valve: The tricuspid valve is normal in structure. Tricuspid valve regurgitation is not demonstrated. Aortic Valve: The aortic valve is tricuspid. Aortic valve regurgitation is mild. Mild aortic valve sclerosis is present, with no evidence of aortic valve stenosis. Pulmonic Valve: The pulmonic valve was normal in structure. Pulmonic valve regurgitation  is not visualized. Pulmonic regurgitation is not visualized. Aorta: The aortic root is normal in size and structure. Venous: The inferior vena cava is normal in size with greater than 50% respiratory variability, suggesting right atrial pressure of 3 mmHg. IAS/Shunts: No atrial level shunt detected by color flow Doppler.  LEFT VENTRICLE PLAX 2D LVIDd:         5.52 cm  Diastology LVIDs:         4.56 cm  LV e' lateral:   5.87 cm/s LV PW:         1.43 cm  LV E/e' lateral: 14.5 LV IVS:        1.49 cm  LV e' medial:    5.55 cm/s LVOT diam:     2.10 cm  LV E/e' medial:  15.4 LV SV:         53 ml LV SV Index:   21.44 LVOT Area:     3.46 cm  RIGHT VENTRICLE RV Basal diam:  2.62 cm RV S prime:     9.68 cm/s TAPSE (M-mode): 3.4 cm LEFT ATRIUM           Index       RIGHT ATRIUM           Index LA diam:      3.80 cm 1.63 cm/m  RA Area:     18.90 cm LA Vol (A2C): 33.0 ml 14.20 ml/m RA Volume:   53.00 ml  22.80 ml/m LA Vol (A4C): 83.1 ml  35.75 ml/m  AORTIC VALVE LVOT Vmax:   83.30 cm/s LVOT Vmean:  55.900 cm/s LVOT VTI:    0.160 m  AORTA Ao Root diam: 3.10 cm MITRAL VALVE MV Area (PHT): 4.68 cm             SHUNTS MV PHT:        46.98 msec           Systemic VTI:  0.16 m MV Decel Time: 162 msec             Systemic Diam: 2.10 cm MV E velocity: 85.30 cm/s 103 cm/s MV A velocity: 67.30 cm/s 70.3 cm/s MV E/A ratio:  1.27       1.5  Loralie Champagne MD Electronically signed by Loralie Champagne MD Signature Date/Time: 05/23/2019/3:37:26 PM    Final      Stark Klein, MD 05/14/2019, 11:39 AM PGY-1, [facility] Intern pager: [phone], text pages welcome

## 2019-05-24 NOTE — Progress Notes (Addendum)
The patient has been seen in conjunction with Reino Bellis, NP. All aspects of care have been considered and discussed. The patient has been personally interviewed, examined, and all clinical data has been reviewed.   Echocardiogram yesterday showed surprisingly low systolic function, EF less than 35%.  Mild cavity enlargement and moderate to severe concentric hypertrophy suggest hypertensive cardiovascular disease with end-organ damage due to poor control of blood pressure.  Left and right heart cath with coronary angiography is appropriate given the patient's risk factors.  This will help exclude myocardial ischemia as a basis for reduced EF.  Guideline directed therapy for reduced LV systolic function should be instituted after cath depending upon status of coronaries (beta-blocker therapy (may not be possible because of reactive airways disease); ARB or Arni; MRA; an SGLT2).  Social determinants of health will significantly impact appropriate therapy in this patient.  Care management should be involved early.   Progress Note  Patient Name: Xavier Doyle Date of Encounter: 05/12/2019  Primary Cardiologist: Sinclair Grooms, MD   Subjective   Feeling well this morning.   Inpatient Medications    Scheduled Meds: . aspirin EC  81 mg Oral Daily  . atorvastatin  80 mg Oral q1800  . enoxaparin (LOVENOX) injection  40 mg Subcutaneous Q24H  . hydrochlorothiazide  25 mg Oral Daily  . losartan  100 mg Oral Daily  . metoprolol succinate  50 mg Oral Daily  . sodium chloride flush  3 mL Intravenous Q12H  . tamsulosin  0.4 mg Oral QPC breakfast   Continuous Infusions: . sodium chloride    . sodium chloride 1 mL/kg/hr (05/20/2019 0532)   PRN Meds: sodium chloride, acetaminophen, albuterol, nitroGLYCERIN, ondansetron (ZOFRAN) IV, sodium chloride flush   Vital Signs    Vitals:   05/23/19 1549 05/23/19 2128 05/07/2019 0643 05/08/2019 0738  BP: 131/89 140/81 (!) 125/108 (!) 137/95    Pulse: 67 74 70   Resp:  20 18   Temp: 97.8 F (36.6 C) 97.7 F (36.5 C) (!) 97.5 F (36.4 C)   TempSrc: Oral Oral Oral   SpO2:  99% 98% 98%  Weight:   122.1 kg   Height:        Intake/Output Summary (Last 24 hours) at 06/03/2019 0917 Last data filed at 05/30/2019 0540 Gross per 24 hour  Intake 841.05 ml  Output --  Net 841.05 ml   Last 3 Weights 05/17/2019 05/23/2019 05/26/2019  Weight (lbs) 269 lb 3.2 oz 270 lb 12.8 oz 271 lb 3.2 oz  Weight (kg) 122.108 kg 122.834 kg 123.016 kg      Telemetry    SR - Personally Reviewed  ECG    No new tracing this morning.  Physical Exam  Pleasant AAM, sitting up in side of the bed.  GEN: No acute distress.   Neck: No JVD Cardiac: RRR, no murmurs, rubs, or gallops.  Respiratory: Clear to auscultation bilaterally. GI: Soft, nontender, non-distended  MS: No edema; No deformity. Neuro:  Nonfocal  Psych: Normal affect   Labs    High Sensitivity Troponin:   Recent Labs  Lab 05/13/2019 1110 05/19/2019 1251 05/23/19 0728  TROPONINIHS 52* 54* 58*      Chemistry Recent Labs  Lab 05/08/2019 1110 05/23/19 0728 05/20/2019 0708  NA 141 139 140  K 3.5 3.7 4.0  CL 107 103 104  CO2 25 27 26   GLUCOSE 114* 97 107*  BUN 12 12 13   CREATININE 1.20 0.94 0.90  CALCIUM 8.7* 8.7*  9.1  GFRNONAA >60 >60 >60  GFRAA >60 >60 >60  ANIONGAP 9 9 10      Hematology Recent Labs  Lab 05/30/2019 1110 05/23/19 0728 05/08/2019 0708  WBC 6.9 6.3 6.7  RBC 5.12 5.18 5.40  HGB 11.8* 11.9* 12.4*  HCT 40.6 40.2 41.9  MCV 79.3* 77.6* 77.6*  MCH 23.0* 23.0* 23.0*  MCHC 29.1* 29.6* 29.6*  RDW 16.4* 16.4* 16.2*  PLT 273 259 275    BNPNo results for input(s): BNP, PROBNP in the last 168 hours.   DDimer  Recent Labs  Lab 05/21/2019 1850  DDIMER 0.47     Radiology    DG Chest 2 View  Result Date: 05/31/2019 CLINICAL DATA:  Chest pain, shortness of breath. EXAM: CHEST - 2 VIEW COMPARISON:  None. FINDINGS: Mild cardiomegaly is noted. No pneumothorax  or significant pleural effusion is noted. Lungs are clear. Bony thorax is unremarkable. IMPRESSION: No active cardiopulmonary disease. Electronically Signed   By: Marijo Conception M.D.   On: 05/24/2019 11:33   ECHOCARDIOGRAM COMPLETE  Result Date: 05/23/2019   ECHOCARDIOGRAM REPORT   Patient Name:   Xavier Doyle Date of Exam: 05/23/2019 Medical Rec #:  [id number]   Height:       68.0 in Accession #:    [id number]  Weight:       270.8 lb Date of Birth:  29-Jan-1961   BSA:          2.32 m Patient Age:    59 years    BP:           153/94 mmHg Patient Gender: M           HR:           77 bpm. Exam Location:  Inpatient Procedure: 2D Echo, Cardiac Doppler and Color Doppler Indications:    Chest pain  History:        Patient has no prior history of Echocardiogram examinations.                 Signs/Symptoms:Chest Pain; Risk Factors:Hypertension and                 Dyslipidemia. Alcohol abuse, Cocaine abuse.  Sonographer:    Dustin Flock Referring Phys: [id number] Darreld Mclean  Sonographer Comments: Patient is morbidly obese. IMPRESSIONS  1. Left ventricular ejection fraction, by visual estimation, is 25 to 30%. The left ventricle has severely decreased function. There is moderately increased left ventricular hypertrophy.  2. Left ventricular diastolic parameters are consistent with Grade I diastolic dysfunction (impaired relaxation).  3. The left ventricle demonstrates global hypokinesis.  4. Global right ventricle has mildly reduced systolic function.The right ventricular size is normal. No increase in right ventricular wall thickness.  5. Left atrial size was mildly dilated.  6. Right atrial size was mildly dilated.  7. The mitral valve is normal in structure. Mild mitral valve regurgitation. No evidence of mitral stenosis.  8. The tricuspid valve is normal in structure. Tricuspid valve regurgitation is not demonstrated.  9. The aortic valve is tricuspid. Aortic valve regurgitation is mild. Mild aortic valve  sclerosis without stenosis. 10. The inferior vena cava is normal in size with greater than 50% respiratory variability, suggesting right atrial pressure of 3 mmHg. 11. TR signal is inadequate for assessing pulmonary artery systolic pressure. FINDINGS  Left Ventricle: Left ventricular ejection fraction, by visual estimation, is 25 to 30%. The left ventricle has severely decreased function. The left ventricle demonstrates global hypokinesis. The  left ventricular internal cavity size was the left ventricle is normal in size. There is moderately increased left ventricular hypertrophy. Left ventricular diastolic parameters are consistent with Grade I diastolic dysfunction (impaired relaxation). Right Ventricle: The right ventricular size is normal. No increase in right ventricular wall thickness. Global RV systolic function is has mildly reduced systolic function. Left Atrium: Left atrial size was mildly dilated. Right Atrium: Right atrial size was mildly dilated Pericardium: There is no evidence of pericardial effusion. Mitral Valve: The mitral valve is normal in structure. Mild mitral valve regurgitation. No evidence of mitral valve stenosis by observation. Tricuspid Valve: The tricuspid valve is normal in structure. Tricuspid valve regurgitation is not demonstrated. Aortic Valve: The aortic valve is tricuspid. Aortic valve regurgitation is mild. Mild aortic valve sclerosis is present, with no evidence of aortic valve stenosis. Pulmonic Valve: The pulmonic valve was normal in structure. Pulmonic valve regurgitation is not visualized. Pulmonic regurgitation is not visualized. Aorta: The aortic root is normal in size and structure. Venous: The inferior vena cava is normal in size with greater than 50% respiratory variability, suggesting right atrial pressure of 3 mmHg. IAS/Shunts: No atrial level shunt detected by color flow Doppler.  LEFT VENTRICLE PLAX 2D LVIDd:         5.52 cm  Diastology LVIDs:         4.56 cm  LV e'  lateral:   5.87 cm/s LV PW:         1.43 cm  LV E/e' lateral: 14.5 LV IVS:        1.49 cm  LV e' medial:    5.55 cm/s LVOT diam:     2.10 cm  LV E/e' medial:  15.4 LV SV:         53 ml LV SV Index:   21.44 LVOT Area:     3.46 cm  RIGHT VENTRICLE RV Basal diam:  2.62 cm RV S prime:     9.68 cm/s TAPSE (M-mode): 3.4 cm LEFT ATRIUM           Index       RIGHT ATRIUM           Index LA diam:      3.80 cm 1.63 cm/m  RA Area:     18.90 cm LA Vol (A2C): 33.0 ml 14.20 ml/m RA Volume:   53.00 ml  22.80 ml/m LA Vol (A4C): 83.1 ml 35.75 ml/m  AORTIC VALVE LVOT Vmax:   83.30 cm/s LVOT Vmean:  55.900 cm/s LVOT VTI:    0.160 m  AORTA Ao Root diam: 3.10 cm MITRAL VALVE MV Area (PHT): 4.68 cm             SHUNTS MV PHT:        46.98 msec           Systemic VTI:  0.16 m MV Decel Time: 162 msec             Systemic Diam: 2.10 cm MV E velocity: 85.30 cm/s 103 cm/s MV A velocity: 67.30 cm/s 70.3 cm/s MV E/A ratio:  1.27       1.5  Loralie Champagne MD Electronically signed by Loralie Champagne MD Signature Date/Time: 05/23/2019/3:37:26 PM    Final     Cardiac Studies   TTE: 05/23/19  IMPRESSIONS    1. Left ventricular ejection fraction, by visual estimation, is 25 to 30%. The left ventricle has severely decreased function. There is moderately increased left ventricular hypertrophy.  2. Left  ventricular diastolic parameters are consistent with Grade I diastolic dysfunction (impaired relaxation).  3. The left ventricle demonstrates global hypokinesis.  4. Global right ventricle has mildly reduced systolic function.The right ventricular size is normal. No increase in right ventricular wall thickness.  5. Left atrial size was mildly dilated.  6. Right atrial size was mildly dilated.  7. The mitral valve is normal in structure. Mild mitral valve regurgitation. No evidence of mitral stenosis.  8. The tricuspid valve is normal in structure. Tricuspid valve regurgitation is not demonstrated.  9. The aortic valve is tricuspid.  Aortic valve regurgitation is mild. Mild aortic valve sclerosis without stenosis. 10. The inferior vena cava is normal in size with greater than 50% respiratory variability, suggesting right atrial pressure of 3 mmHg. 11. TR signal is inadequate for assessing pulmonary artery systolic pressure.  Patient Profile     59 y.o. male with a history of hypertension, hyperlipidemia, asthma, arthritis, anxiety/depression, alcohol abuse, and prior cocaine abuse but no known cardiac history who was seen for the evaluation of chest pain at the request of Dr. Andria Frames.  Assessment & Plan    1. Chest Pain: Patient presented with sudden onset of chest pain that resolved with Nitro. EKG showed T wave inversions in inferior leads and leads V5-V6. No prior tracing for comparison. - High-sensitivity troponin minimally elevated and flat at  52 >> 54 >> 58. Initial plan was for coronary CT but echo came back abnormal with EF of 25-30% with global hypokinesis. Now planned for [facility] today.  - on asa, statin, BB, ARB. Will stop HCTZ and switch to spiro.   2. Acute combined HF: EF noted at 25-30% with global hypokinesis. No signs of volume overload on exam. Medication changes as above. Planned for [facility] today to define etiology. -- needs diet education, eats lots of fatty fried foods.   3. Hypertension: stable with current medication regimen.  - Continue home Losartan 100mg  daily and HCTZ 25mg  daily.  - Started on Toprol-XL 50mg , and continued on losartan 100mg  daily. Switching HCTZ to spiro as above.   4. Hyperlipidemia: Lipid panel this admission: Total Cholesterol 188, Triglycerides 145, HDL 39, LDL 120. Started on high dose statin.   5. Pre-Diabetes: Hemoglobin A1c 6.4 this admission consistent with pre-diabetes. - Recommend lifestyle modifications.   6. Suspected Sleep Apnea:  Patient reports snoring and daytime fatigue. - BMI 41.17. Suspect he has sleep apnea but has never had a sleep study. - Recommend  outpatient sleep study.  6. Tobacco Use - Complete cessation recommended.   For questions or updates, please contact [place] Please consult [email or web address] for contact info under   Signed, Reino Bellis, NP  05/06/2019, 9:17 AM

## 2019-05-24 NOTE — Evaluation (Signed)
Occupational Therapy Evaluation Patient Details Name: Xavier Doyle MRN: [id number] DOB: Sep 17, 1960 Today's Date: 05/15/2019    History of Present Illness 59yo male c/o of CP after stopping his mother from falling, admitted for r/o of ACS and elevated high sensitivity troponins. Wells score 0, so no significant workup done for PE. PMH anxiety, HLD, HTN, obesity   Clinical Impression   Patient is a 59 year old male that lives with his mother in a single level home with 2 steps to enter. Patient currently at baseline with self care, does not require any physical assist or AD. Will discontinue acute OT services at this time.    Follow Up Recommendations  No OT follow up    Equipment Recommendations  None recommended by OT       Precautions / Restrictions Precautions Precautions: Fall;Other (comment) Precaution Comments: obesity, chronic B knee pain Restrictions Weight Bearing Restrictions: No      Mobility Bed Mobility Overal bed mobility: Independent                Transfers Overall transfer level: Independent Equipment used: None             General transfer comment: wide BOS    Balance Overall balance assessment: No apparent balance deficits (not formally assessed)                                         ADL either performed or assessed with clinical judgement   ADL Overall ADL's : Independent                                       General ADL Comments: patient utilize bathroom to void and perform sink side grooming/hygiene without assist                  Pertinent Vitals/Pain Pain Assessment: Faces Faces Pain Scale: Hurts a little bit Pain Location: B knees with standing Pain Descriptors / Indicators: Grimacing Pain Intervention(s): Limited activity within patient's tolerance     Hand Dominance Right   Extremity/Trunk Assessment Upper Extremity Assessment Upper Extremity Assessment: Overall WFL for tasks  assessed   Lower Extremity Assessment Lower Extremity Assessment: Defer to PT evaluation   Cervical / Trunk Assessment Cervical / Trunk Assessment: Kyphotic   Communication Communication Communication: No difficulties   Cognition Arousal/Alertness: Awake/alert Behavior During Therapy: WFL for tasks assessed/performed;Flat affect Overall Cognitive Status: Within Functional Limits for tasks assessed                                 General Comments: follows commands well and is appropriate with PT, but seems a bit annoyed with therapist this morning   General Comments  mild SOB with mobility            Home Living Family/patient expects to be discharged to:: Private residence Living Arrangements: Parent Available Help at Discharge: Family;Available PRN/intermittently Type of Home: House Home Access: Stairs to enter [employer] of Steps: 2 Entrance Stairs-Rails: None Home Layout: One level     Bathroom Shower/Tub: Teacher, early years/pre: Standard     Home Equipment: Cane - single point;Walker - 2 wheels   Additional Comments: uses SPC occasionally when knees are acting up  Prior Functioning/Environment Level of Independence: Independent with assistive device(s)        Comments: uses cane when knees are painful, 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        OT Problem List: Impaired balance (sitting and/or standing)       AM-PAC OT "6 Clicks" Daily Activity     Outcome Measure Help from another person eating meals?: None Help from another person taking care of personal grooming?: None Help from another person toileting, which includes using toliet, bedpan, or urinal?: None Help from another person bathing (including washing, rinsing, drying)?: None Help from another person to put on and taking off regular upper body clothing?: None Help from another person to put on and taking off regular lower body clothing?:  None 6 Click Score: 24   End of Session    Activity Tolerance: Patient tolerated treatment well Patient left: in bed;with call bell/phone within reach  OT Visit Diagnosis: Unsteadiness on feet (R26.81)                Time: [id number] OT Time Calculation (min): 20 min Charges:  OT General Charges $OT Visit: 1 Visit OT Evaluation $OT Eval Moderate Complexity: 1 Mod  Shon Millet OT OT office: [place] 05/18/2019, 9:38 AM

## 2019-05-24 NOTE — Progress Notes (Addendum)
RN went to complete hourly rounding and administer scheduled medications. Upon assessment pt was going to the bathroom and ordering dinner. Lipitor given at 1613. No pain or distress noted. Pt stated "I feel great and just ready to go home". RN continued re-assessment and pt A/O x 4, in NSR. After leaving the room, this RN was notified at 1621 of possible irregular rhythm once by CCMD and stated room 6E04 had possible irregular rhythm," looks like pt is walking or moving around". RN went to assess pt. Pt was sitting on the side of the bed talking on the phone and stated he didn't need anything at this time.   At [place] notified charge nurse stating the patient appeared to be in vfib/vatch and now asystole. Charge nurse running down hall, this RN followed. Upon assessment, pt was lying in bed, unresponsive. Chest compressions started and code blue activated. Patient with ROSC, transferred to [facility]. Report given to receiving RN.

## 2019-05-25 ENCOUNTER — Inpatient Hospital Stay (HOSPITAL_COMMUNITY): Payer: Medicare Other

## 2019-05-25 DIAGNOSIS — R9401 Abnormal electroencephalogram [EEG]: Secondary | ICD-10-CM

## 2019-05-25 DIAGNOSIS — I428 Other cardiomyopathies: Secondary | ICD-10-CM

## 2019-05-25 DIAGNOSIS — I469 Cardiac arrest, cause unspecified: Secondary | ICD-10-CM

## 2019-05-25 LAB — COMPREHENSIVE METABOLIC PANEL
ALT: 163 U/L — ABNORMAL HIGH (ref 0–44)
ALT: 163 U/L — ABNORMAL HIGH (ref 0–44)
ALT: 146 U/L — ABNORMAL HIGH (ref 0–44)
ALT: 142 U/L — ABNORMAL HIGH (ref 0–44)
AST: 328 U/L — ABNORMAL HIGH (ref 15–41)
AST: 329 U/L — ABNORMAL HIGH (ref 15–41)
AST: 296 U/L — ABNORMAL HIGH (ref 15–41)
AST: 267 U/L — ABNORMAL HIGH (ref 15–41)
Albumin: 3.2 g/dL — ABNORMAL LOW (ref 3.5–5.0)
Albumin: 3.2 g/dL — ABNORMAL LOW (ref 3.5–5.0)
Albumin: 2.8 g/dL — ABNORMAL LOW (ref 3.5–5.0)
Albumin: 2.8 g/dL — ABNORMAL LOW (ref 3.5–5.0)
Alkaline Phosphatase: 95 U/L (ref 38–126)
Alkaline Phosphatase: 101 U/L (ref 38–126)
Alkaline Phosphatase: 90 U/L (ref 38–126)
Alkaline Phosphatase: 87 U/L (ref 38–126)
Anion gap: 13 (ref 5–15)
Anion gap: 11 (ref 5–15)
Anion gap: 11 (ref 5–15)
Anion gap: 11 (ref 5–15)
BUN: 26 mg/dL — ABNORMAL HIGH (ref 6–20)
BUN: 26 mg/dL — ABNORMAL HIGH (ref 6–20)
BUN: 25 mg/dL — ABNORMAL HIGH (ref 6–20)
BUN: 27 mg/dL — ABNORMAL HIGH (ref 6–20)
CO2: 20 mmol/L — ABNORMAL LOW (ref 22–32)
CO2: 20 mmol/L — ABNORMAL LOW (ref 22–32)
CO2: 20 mmol/L — ABNORMAL LOW (ref 22–32)
CO2: 19 mmol/L — ABNORMAL LOW (ref 22–32)
Calcium: 8.1 mg/dL — ABNORMAL LOW (ref 8.9–10.3)
Calcium: 8 mg/dL — ABNORMAL LOW (ref 8.9–10.3)
Calcium: 7.8 mg/dL — ABNORMAL LOW (ref 8.9–10.3)
Calcium: 7.8 mg/dL — ABNORMAL LOW (ref 8.9–10.3)
Chloride: 104 mmol/L (ref 98–111)
Chloride: 106 mmol/L (ref 98–111)
Chloride: 105 mmol/L (ref 98–111)
Chloride: 108 mmol/L (ref 98–111)
Creatinine, Ser: 1.82 mg/dL — ABNORMAL HIGH (ref 0.61–1.24)
Creatinine, Ser: 1.66 mg/dL — ABNORMAL HIGH (ref 0.61–1.24)
Creatinine, Ser: 1.67 mg/dL — ABNORMAL HIGH (ref 0.61–1.24)
Creatinine, Ser: 1.57 mg/dL — ABNORMAL HIGH (ref 0.61–1.24)
GFR calc Af Amer: 46 mL/min — ABNORMAL LOW (ref >60)
GFR calc Af Amer: 52 mL/min — ABNORMAL LOW (ref >60)
GFR calc Af Amer: 51 mL/min — ABNORMAL LOW (ref >60)
GFR calc Af Amer: 55 mL/min — ABNORMAL LOW (ref >60)
GFR calc non Af Amer: 40 mL/min — ABNORMAL LOW (ref >60)
GFR calc non Af Amer: 45 mL/min — ABNORMAL LOW (ref >60)
GFR calc non Af Amer: 44 mL/min — ABNORMAL LOW (ref >60)
GFR calc non Af Amer: 48 mL/min — ABNORMAL LOW (ref >60)
Glucose, Bld: 155 mg/dL — ABNORMAL HIGH (ref 70–99)
Glucose, Bld: 164 mg/dL — ABNORMAL HIGH (ref 70–99)
Glucose, Bld: 130 mg/dL — ABNORMAL HIGH (ref 70–99)
Glucose, Bld: 127 mg/dL — ABNORMAL HIGH (ref 70–99)
Potassium: 3.9 mmol/L (ref 3.5–5.1)
Potassium: 3.5 mmol/L (ref 3.5–5.1)
Potassium: 4.5 mmol/L (ref 3.5–5.1)
Potassium: 3.6 mmol/L (ref 3.5–5.1)
Sodium: 137 mmol/L (ref 135–145)
Sodium: 137 mmol/L (ref 135–145)
Sodium: 136 mmol/L (ref 135–145)
Sodium: 138 mmol/L (ref 135–145)
Total Bilirubin: 0.6 mg/dL (ref 0.3–1.2)
Total Bilirubin: 0.1 mg/dL — ABNORMAL LOW (ref 0.3–1.2)
Total Bilirubin: 0.6 mg/dL (ref 0.3–1.2)
Total Bilirubin: 0.4 mg/dL (ref 0.3–1.2)
Total Protein: 6.7 g/dL (ref 6.5–8.1)
Total Protein: 6.8 g/dL (ref 6.5–8.1)
Total Protein: 6.1 g/dL — ABNORMAL LOW (ref 6.5–8.1)
Total Protein: 6.3 g/dL — ABNORMAL LOW (ref 6.5–8.1)

## 2019-05-25 LAB — CBC
HCT: 44.8 % (ref 39.0–52.0)
Hemoglobin: 13 g/dL (ref 13.0–17.0)
MCH: 22.9 pg — ABNORMAL LOW (ref 26.0–34.0)
MCHC: 29 g/dL — ABNORMAL LOW (ref 30.0–36.0)
MCV: 79 fL — ABNORMAL LOW (ref 80.0–100.0)
Platelets: 306 10*3/uL (ref 150–400)
RBC: 5.67 MIL/uL (ref 4.22–5.81)
RDW: 16.3 % — ABNORMAL HIGH (ref 11.5–15.5)
WBC: 14.7 10*3/uL — ABNORMAL HIGH (ref 4.0–10.5)
nRBC: 0 % (ref 0.0–0.2)

## 2019-05-25 LAB — POCT I-STAT 7, (LYTES, BLD GAS, ICA,H+H)
Acid-base deficit: 4 mmol/L — ABNORMAL HIGH (ref 0.0–2.0)
Bicarbonate: 22.8 mmol/L (ref 20.0–28.0)
Calcium, Ion: 1.11 mmol/L — ABNORMAL LOW (ref 1.15–1.40)
HCT: 42 % (ref 39.0–52.0)
Hemoglobin: 14.3 g/dL (ref 13.0–17.0)
O2 Saturation: 99 %
Patient temperature: 33.4
Potassium: 3.7 mmol/L (ref 3.5–5.1)
Sodium: 140 mmol/L (ref 135–145)
TCO2: 24 mmol/L (ref 22–32)
pCO2 arterial: 40 mmHg (ref 32.0–48.0)
pH, Arterial: 7.345 — ABNORMAL LOW (ref 7.350–7.450)
pO2, Arterial: 118 mmHg — ABNORMAL HIGH (ref 83.0–108.0)

## 2019-05-25 LAB — GLUCOSE, CAPILLARY
Glucose-Capillary: 113 mg/dL — ABNORMAL HIGH (ref 70–99)
Glucose-Capillary: 116 mg/dL — ABNORMAL HIGH (ref 70–99)
Glucose-Capillary: 119 mg/dL — ABNORMAL HIGH (ref 70–99)
Glucose-Capillary: 134 mg/dL — ABNORMAL HIGH (ref 70–99)
Glucose-Capillary: 139 mg/dL — ABNORMAL HIGH (ref 70–99)
Glucose-Capillary: 148 mg/dL — ABNORMAL HIGH (ref 70–99)
Glucose-Capillary: 151 mg/dL — ABNORMAL HIGH (ref 70–99)
Glucose-Capillary: 155 mg/dL — ABNORMAL HIGH (ref 70–99)
Glucose-Capillary: 156 mg/dL — ABNORMAL HIGH (ref 70–99)
Glucose-Capillary: 166 mg/dL — ABNORMAL HIGH (ref 70–99)
Glucose-Capillary: 203 mg/dL — ABNORMAL HIGH (ref 70–99)
Glucose-Capillary: 248 mg/dL — ABNORMAL HIGH (ref 70–99)

## 2019-05-25 LAB — POCT I-STAT, CHEM 8
BUN: 23 mg/dL — ABNORMAL HIGH (ref 6–20)
BUN: 23 mg/dL — ABNORMAL HIGH (ref 6–20)
BUN: 26 mg/dL — ABNORMAL HIGH (ref 6–20)
Calcium, Ion: 1.1 mmol/L — ABNORMAL LOW (ref 1.15–1.40)
Calcium, Ion: 1.16 mmol/L (ref 1.15–1.40)
Calcium, Ion: 1.18 mmol/L (ref 1.15–1.40)
Chloride: 104 mmol/L (ref 98–111)
Chloride: 105 mmol/L (ref 98–111)
Chloride: 105 mmol/L (ref 98–111)
Creatinine, Ser: 1.5 mg/dL — ABNORMAL HIGH (ref 0.61–1.24)
Creatinine, Ser: 1.6 mg/dL — ABNORMAL HIGH (ref 0.61–1.24)
Creatinine, Ser: 1.6 mg/dL — ABNORMAL HIGH (ref 0.61–1.24)
Glucose, Bld: 168 mg/dL — ABNORMAL HIGH (ref 70–99)
Glucose, Bld: 191 mg/dL — ABNORMAL HIGH (ref 70–99)
Glucose, Bld: 251 mg/dL — ABNORMAL HIGH (ref 70–99)
HCT: 44 % (ref 39.0–52.0)
HCT: 45 % (ref 39.0–52.0)
HCT: 45 % (ref 39.0–52.0)
Hemoglobin: 15 g/dL (ref 13.0–17.0)
Hemoglobin: 15.3 g/dL (ref 13.0–17.0)
Hemoglobin: 15.3 g/dL (ref 13.0–17.0)
Potassium: 3.5 mmol/L (ref 3.5–5.1)
Potassium: 3.6 mmol/L (ref 3.5–5.1)
Potassium: 3.7 mmol/L (ref 3.5–5.1)
Sodium: 139 mmol/L (ref 135–145)
Sodium: 139 mmol/L (ref 135–145)
Sodium: 140 mmol/L (ref 135–145)
TCO2: 21 mmol/L — ABNORMAL LOW (ref 22–32)
TCO2: 21 mmol/L — ABNORMAL LOW (ref 22–32)
TCO2: 22 mmol/L (ref 22–32)

## 2019-05-25 LAB — PROTIME-INR
INR: 1 (ref 0.8–1.2)
Prothrombin Time: 13.2 seconds (ref 11.4–15.2)

## 2019-05-25 LAB — MAGNESIUM
Magnesium: 1.6 mg/dL — ABNORMAL LOW (ref 1.7–2.4)
Magnesium: 1.7 mg/dL (ref 1.7–2.4)
Magnesium: 1.8 mg/dL (ref 1.7–2.4)
Magnesium: 2.1 mg/dL (ref 1.7–2.4)

## 2019-05-25 LAB — CALCIUM, IONIZED: Calcium, Ionized, Serum: 4.3 mg/dL — ABNORMAL LOW (ref 4.5–5.6)

## 2019-05-25 LAB — PHOSPHORUS: Phosphorus: 3.7 mg/dL (ref 2.5–4.6)

## 2019-05-25 LAB — ECHOCARDIOGRAM COMPLETE
Height: 68 in
Weight: 4483.63 oz

## 2019-05-25 LAB — APTT: aPTT: 28 seconds (ref 24–36)

## 2019-05-25 MED ORDER — INSULIN DETEMIR 100 UNIT/ML ~~LOC~~ SOLN
10.0000 [IU] | Freq: Two times a day (BID) | SUBCUTANEOUS | Status: DC
  Administered 2019-05-26 – 2019-05-28 (×6): 10 [IU] via SUBCUTANEOUS
  Filled 2019-05-25 (×8): qty 0.1

## 2019-05-25 MED ORDER — MAGNESIUM SULFATE 2 GM/50ML IV SOLN
2.0000 g | Freq: Once | INTRAVENOUS | Status: AC
  Administered 2019-05-25: 2 g via INTRAVENOUS
  Filled 2019-05-25: qty 50

## 2019-05-25 MED ORDER — ORAL CARE MOUTH RINSE
15.0000 mL | OROMUCOSAL | Status: DC
  Administered 2019-05-25 – 2019-06-12 (×179): 15 mL via OROMUCOSAL

## 2019-05-25 MED ORDER — SODIUM CHLORIDE 0.9% FLUSH: 10.0000 mL | INTRAVENOUS | Status: DC | PRN

## 2019-05-25 MED ORDER — LEVETIRACETAM IN NACL 1000 MG/100ML IV SOLN
1000.0000 mg | Freq: Two times a day (BID) | INTRAVENOUS | Status: DC
  Administered 2019-05-25 – 2019-06-12 (×36): 1000 mg via INTRAVENOUS
  Filled 2019-05-25 (×38): qty 100

## 2019-05-25 MED ORDER — PANTOPRAZOLE SODIUM 40 MG IV SOLR
40.0000 mg | INTRAVENOUS | Status: DC
  Administered 2019-05-25 – 2019-06-03 (×10): 40 mg via INTRAVENOUS
  Filled 2019-05-25 (×10): qty 40

## 2019-05-25 MED ORDER — INSULIN DETEMIR 100 UNIT/ML ~~LOC~~ SOLN
10.0000 [IU] | Freq: Two times a day (BID) | SUBCUTANEOUS | Status: DC
  Administered 2019-05-25: 10 [IU] via SUBCUTANEOUS
  Filled 2019-05-25 (×2): qty 0.1

## 2019-05-25 MED ORDER — CHLORHEXIDINE GLUCONATE 0.12% ORAL RINSE (MEDLINE KIT)
15.0000 mL | Freq: Two times a day (BID) | OROMUCOSAL | Status: DC
  Administered 2019-05-25 – 2019-06-12 (×37): 15 mL via OROMUCOSAL

## 2019-05-25 MED ORDER — POTASSIUM CHLORIDE 10 MEQ/100ML IV SOLN
INTRAVENOUS | Status: AC
  Filled 2019-05-25: qty 100

## 2019-05-25 MED ORDER — FUROSEMIDE 10 MG/ML IJ SOLN
20.0000 mg | Freq: Once | INTRAMUSCULAR | Status: AC
  Administered 2019-05-25: 20 mg via INTRAVENOUS
  Filled 2019-05-25: qty 2

## 2019-05-25 MED ORDER — INSULIN ASPART 100 UNIT/ML ~~LOC~~ SOLN
3.0000 [IU] | SUBCUTANEOUS | Status: DC
  Administered 2019-05-25: 6 [IU] via SUBCUTANEOUS

## 2019-05-25 MED ORDER — SODIUM CHLORIDE 0.9% FLUSH: 10.0000 mL | Freq: Two times a day (BID) | INTRAVENOUS | Status: DC

## 2019-05-25 MED ORDER — POTASSIUM CHLORIDE 10 MEQ/50ML IV SOLN
10.0000 meq | INTRAVENOUS | Status: AC
  Administered 2019-05-25 (×4): 10 meq via INTRAVENOUS
  Filled 2019-05-25 (×4): qty 50

## 2019-05-25 NOTE — Progress Notes (Signed)
Progress Note  Patient Name: Xavier Doyle Date of Encounter: 05/25/2019  Primary Cardiologist: Sinclair Grooms, MD   Subjective   Intubated.  Currently on the hypothermia protocol.   Required resuscitation after developing ventricular fibrillation around 4:20 PM on 6 E.  Prolonged CPR.  Ultimate ROSC after approximately 20 to 25 minutes.  Inpatient Medications    Scheduled Meds: . artificial tears  1 application Both Eyes M5Y  . aspirin EC  81 mg Oral Daily  . atorvastatin  80 mg Oral q1800  . chlorhexidine gluconate (MEDLINE KIT)  15 mL Mouth Rinse BID  . Chlorhexidine Gluconate Cloth  6 each Topical Daily  . cisatracurium  0.1 mg/kg Intravenous Once  . heparin  5,000 Units Subcutaneous Q8H  . [START ON 05/26/2019] insulin detemir  10 Units Subcutaneous Q12H  . mouth rinse  15 mL Mouth Rinse 10 times per day  . pantoprazole (PROTONIX) IV  40 mg Intravenous Q24H  . vecuronium  0.1 mg/kg Intravenous Once   Continuous Infusions: . amiodarone Stopped (05/25/19 0706)  . cisatracurium (NIMBEX) infusion 1.5 mcg/kg/min (05/25/19 1100)  . DOPamine 6 mcg/kg/min (05/25/19 1100)  . fentaNYL infusion INTRAVENOUS 200 mcg/hr (05/25/19 1100)  . magnesium sulfate bolus IVPB 50 mL/hr at 05/25/19 1100  . norepinephrine (LEVOPHED) Adult infusion Stopped (05/25/19 1052)  . potassium chloride Stopped (05/25/19 1029)  . propofol (DIPRIVAN) infusion 35 mcg/kg/min (05/25/19 1100)  . sodium chloride     PRN Meds: acetaminophen, albuterol, amiodarone, cisatracurium **AND** cisatracurium (NIMBEX) infusion **AND** cisatracurium, fentaNYL, nitroGLYCERIN, ondansetron (ZOFRAN) IV   Vital Signs    Vitals:   05/25/19 0845 05/25/19 0900 05/25/19 1000 05/25/19 1100  BP:   (!) 147/89 115/74  Pulse:      Resp: '13 18 18 18  '$ Temp:  (!) 90.9 F (32.7 C) (!) 90.1 F (32.3 C) (!) 90 F (32.2 C)  TempSrc:  Bladder Bladder Bladder  SpO2:      Weight:      Height:        Intake/Output Summary (Last  24 hours) at 05/25/2019 1117 Last data filed at 05/25/2019 1100 Gross per 24 hour  Intake 4265.71 ml  Output 1625 ml  Net 2640.71 ml   Last 3 Weights 05/25/2019 05/23/2019 05/23/2019  Weight (lbs) 280 lb 3.6 oz 269 lb 3.2 oz 270 lb 12.8 oz  Weight (kg) 127.11 kg 122.108 kg 122.834 kg      Telemetry    Sinus bradycardia with atrial bigeminy- Personally Reviewed  ECG    A repeat EKG has not been performed- Personally Reviewed  Physical Exam  Obese African-American male GEN:  Intubated Neck:  Unable to assess JVD Cardiac:  Distant cardiac sounds Respiratory:  Lungs aerated bilaterally GI:  Abdomen is soft MS:  No Neuro:   Sedated and paralyzed   Labs    High Sensitivity Troponin:   Recent Labs  Lab 05/12/2019 1110 06/01/2019 1251 05/23/19 0728 05/23/2019 1733 05/28/2019 1934  TROPONINIHS 52* 54* 58* 116* 13,071*      Chemistry Recent Labs  Lab 05/17/2019 1733 05/21/2019 2028 05/25/19 0455 05/25/19 0841 05/25/19 0843  NA 138   < > 137 139 137  K 3.3*   < > 3.9 3.5 3.5  CL 100   < > 104 105 106  CO2 22  --  20*  --  20*  GLUCOSE 232*   < > 155* 168* 164*  BUN 14   < > 26* 26* 26*  CREATININE 1.35*   < >  1.82* 1.60* 1.66*  CALCIUM 8.1*  --  8.1*  --  8.0*  PROT  --   --  6.7  --  6.8  ALBUMIN  --   --  3.2*  --  3.2*  AST  --   --  328*  --  329*  ALT  --   --  163*  --  163*  ALKPHOS  --   --  95  --  101  BILITOT  --   --  0.6  --  0.1*  GFRNONAA 57*  --  40*  --  45*  GFRAA >60  --  46*  --  52*  ANIONGAP 16*  --  13  --  11   < > = values in this interval not displayed.     Hematology Recent Labs  Lab 05/21/2019 0708 05/08/2019 1123 05/25/2019 1733 05/10/2019 2028 05/25/19 0413 05/25/19 0455 05/25/19 0841  WBC 6.7  --  10.2  --   --  14.7*  --   RBC 5.40  --  5.01  --   --  5.67  --   HGB 12.4*   < > 11.7*   < > 14.3 13.0 15.3  HCT 41.9   < > 40.7   < > 42.0 44.8 45.0  MCV 77.6*  --  81.2  --   --  79.0*  --   MCH 23.0*  --  23.4*  --   --  22.9*  --     MCHC 29.6*  --  28.7*  --   --  29.0*  --   RDW 16.2*  --  16.3*  --   --  16.3*  --   PLT 275  --  265  --   --  306  --    < > = values in this interval not displayed.    BNPNo results for input(s): BNP, PROBNP in the last 168 hours.   DDimer  Recent Labs  Lab 05/21/2019 1850  DDIMER 0.47     Radiology    DG Chest 1 View  Result Date: 05/16/2019 CLINICAL DATA:  Central line placement OG tube placement EXAM: CHEST  1 VIEW COMPARISON:  05/21/2019 FINDINGS: Endotracheal tube tip is about 2.7 cm superior to the carina. Esophageal tube tip below the diaphragm but incompletely visualized. Left-sided central venous catheter tip partially obscured by support device, appears to be over the upper SVC. No pneumothorax. Low lung volumes. Enlarged cardiomediastinal silhouette. Increasing airspace disease at the left base. Right lung grossly clear. IMPRESSION: 1. Endotracheal tube tip about 2.7 cm superior to carina. Esophageal tube tip below the diaphragm but incompletely visualized 2. Left IJ central venous catheter tip partially obscured, appears to overlie the SVC origin. No left pneumothorax 3. Increasing airspace disease at the left base.  Cardiomegaly. Electronically Signed   By: Donavan Foil M.D.   On: 05/20/2019 19:16   DG Abd 1 View  Result Date: 05/23/2019 CLINICAL DATA:  OG tube placement EXAM: ABDOMEN - 1 VIEW COMPARISON:  None. FINDINGS: Airspace disease at the left base. Esophageal tube tip overlies the distal stomach. IMPRESSION: Esophageal tube tip overlies the distal stomach. Electronically Signed   By: Donavan Foil M.D.   On: 05/18/2019 19:17   CARDIAC CATHETERIZATION  Result Date: 06/01/2019  Mid RCA lesion is 10% stenosed. No significant CAD.  LV end diastolic pressure is moderately elevated. LVEDP 26 mm Hg.  There is no aortic valve stenosis.  Hemodynamic findings  consistent with mild pulmonary hypertension.  Ao sat 96%, PA sat 67%, mean PA pressure 29 mm Hg; mean PCWP 20;  CO 6.3 L/min, CI 2.72  Medical therapy for nonischemic cardiomyopathy.   DG Chest Port 1 View  Result Date: 05/15/2019 CLINICAL DATA:  Status post intubation. EXAM: PORTABLE CHEST 1 VIEW COMPARISON:  May 22, 2019 FINDINGS: An endotracheal tube is seen with its distal tip approximately 4.7 cm from the carina. This represents a new finding when compared to the prior study. Mildly radiopaque tags and labels are seen overlying the left lung with subsequently limited evaluation of the pulmonary parenchyma within these regions. There is no evidence of acute infiltrate, pleural effusion or pneumothorax. There is mild to moderate severity enlargement of the cardiac silhouette. The visualized skeletal structures are unremarkable. IMPRESSION: 1. Interval endotracheal tube placement and positioning, as described above, when compared to the prior chest plain film dated May 22, 2019. Electronically Signed   By: Virgina Norfolk M.D.   On: 06/05/2019 17:43   EEG adult  Result Date: 05/28/2019 Lora Havens, MD     05/31/2019  9:05 PM Patient Name: Xavier Doyle MRN: [id number] Epilepsy Attending: Lora Havens Referring Physician/Provider: Merlene Laughter, NP Date: 05/30/2019 Duration: 21.03 mins Patient history: 59yo s/p cardiac arrest now on TTM. EEG to evaluate for seizure Level of alertness: comatose AEDs during EEG study: Propofol Technical aspects: This EEG study was done with scalp electrodes positioned according to the 10-20 International system of electrode placement. Electrical activity was acquired at a sampling rate of '500Hz'$  and reviewed with a high frequency filter of '70Hz'$  and a low frequency filter of '1Hz'$ . EEG data were recorded continuously and digitally stored. DESCRIPTION: EEG showed continuous generalized background suppression. Intermittent generalized high amplitude sharply contoured 4-6zh theta slowing was also noted. Hyperventilation and photic stimulation were not performed. ABNORMALITY -  Background suppression, generalized - Intermittent slow, generalized IMPRESSION: This study is showed evidence of profound diffuse encephalopathy, non specific to etiology. No seizures or definite epileptiform discharges were seen throughout the recording. Priyanka [name]   Overnight EEG with video  Result Date: 05/25/2019 Lora Havens, MD     05/25/2019  8:58 AM Patient Name: Xavier Doyle MRN: [id number] Epilepsy Attending: Lora Havens Referring Physician/Provider: Merlene Laughter, NP Duration: 05/21/2019 2048 to 05/25/2019 0845  Patient history: 59yo s/p cardiac arrest now on TTM. EEG to evaluate for seizure  Level of alertness: comatose  AEDs during EEG study: Propofol  Technical aspects: This EEG study was done with scalp electrodes positioned according to the 10-20 International system of electrode placement. Electrical activity was acquired at a sampling rate of '500Hz'$  and reviewed with a high frequency filter of '70Hz'$  and a low frequency filter of '1Hz'$ . EEG data were recorded continuously and digitally stored.  DESCRIPTION: EEG showed continuous generalized background suppression. Intermittent generalized high amplitude sharply contoured 4-6zh theta slowing was also noted. Hyperventilation and photic stimulation were not performed.  ABNORMALITY - Burst suppression, generalized  IMPRESSION: This study is showed evidence of profound diffuse encephalopathy, non specific to etiology. No seizures or definite epileptiform discharges were seen throughout the recording.  Lora Havens   ECHOCARDIOGRAM COMPLETE  Result Date: 05/25/2019   ECHOCARDIOGRAM REPORT   Patient Name:   Xavier Doyle Date of Exam: 05/25/2019 Medical Rec #:  [id number]   Height:       68.0 in Accession #:    [id number]  Weight:       280.2 lb  Date of Birth:  03-10-1961   BSA:          2.36 m Patient Age:    59 years    BP:           129/77 mmHg Patient Gender: M           HR:           43 bpm. Exam Location:  Inpatient Procedure:  2D Echo, Cardiac Doppler and Color Doppler STAT ECHO Indications:    Cardiac arrest I46.9  History:        Patient has prior history of Echocardiogram examinations, most                 recent 05/23/2019. Arrythmias:Cardiac Arrest,                 Signs/Symptoms:Chest Pain; Risk Factors:Hypertension,                 Dyslipidemia and Former Smoker.  Sonographer:    Paulita Fujita RDCS Referring Phys: [id number] CHI JANE ELLISON IMPRESSIONS  1. Left ventricular ejection fraction, by visual estimation, is 25 to 30%. The left ventricle has severely decreased function. There is mildly increased left ventricular hypertrophy.  2. Left ventricular diastolic parameters are consistent with Grade I diastolic dysfunction (impaired relaxation).  3. The left ventricle demonstrates global hypokinesis.  4. Global right ventricle has moderately reduced systolic function.The right ventricular size is normal. No increase in right ventricular wall thickness.  5. Left atrial size was normal.  6. Right atrial size was normal.  7. The mitral valve is normal in structure. Trivial mitral valve regurgitation. No evidence of mitral stenosis.  8. The tricuspid valve is normal in structure.  9. The tricuspid valve is normal in structure. Tricuspid valve regurgitation is trivial. 10. The aortic valve is tricuspid. Aortic valve regurgitation is trivial. No evidence of aortic valve sclerosis or stenosis. 11. The pulmonic valve was normal in structure. Pulmonic valve regurgitation is not visualized. 12. Moderately elevated pulmonary artery systolic pressure. 13. The tricuspid regurgitant velocity is 2.73 m/s, and with an assumed right atrial pressure of 15 mmHg, the estimated right ventricular systolic pressure is moderately elevated at 44.8 mmHg. 14. The inferior vena cava is dilated in size with <50% respiratory variability, suggesting right atrial pressure of 15 mmHg. 15. No significant change from prior study. FINDINGS  Left Ventricle: Left  ventricular ejection fraction, by visual estimation, is 25 to 30%. The left ventricle has severely decreased function. The left ventricle demonstrates global hypokinesis. There is mildly increased left ventricular hypertrophy. Left ventricular diastolic parameters are consistent with Grade I diastolic dysfunction (impaired relaxation). Normal left atrial pressure. Right Ventricle: The right ventricular size is normal. No increase in right ventricular wall thickness. Global RV systolic function is has moderately reduced systolic function. The tricuspid regurgitant velocity is 2.73 m/s, and with an assumed right atrial pressure of 15 mmHg, the estimated right ventricular systolic pressure is moderately elevated at 44.8 mmHg. Left Atrium: Left atrial size was normal in size. Right Atrium: Right atrial size was normal in size Pericardium: There is no evidence of pericardial effusion. Mitral Valve: The mitral valve is normal in structure. There is mild thickening of the mitral valve leaflet(s). Trivial mitral valve regurgitation. No evidence of mitral valve stenosis by observation. Tricuspid Valve: The tricuspid valve is normal in structure. Tricuspid valve regurgitation is trivial. Aortic Valve: The aortic valve is tricuspid. . There is mild thickening and mild calcification of the  aortic valve. Aortic valve regurgitation is trivial. The aortic valve is structurally normal, with no evidence of sclerosis or stenosis. There is mild thickening of the aortic valve. There is mild calcification of the aortic valve. Pulmonic Valve: The pulmonic valve was normal in structure. Pulmonic valve regurgitation is not visualized. Pulmonic regurgitation is not visualized. Aorta: The aortic root, ascending aorta and aortic arch are all structurally normal, with no evidence of dilitation or obstruction. Venous: The inferior vena cava is dilated in size with less than 50% respiratory variability, suggesting right atrial pressure of 15  mmHg. IAS/Shunts: No atrial level shunt detected by color flow Doppler. There is no evidence of a patent foramen ovale. No ventricular septal defect is seen or detected. There is no evidence of an atrial septal defect.  LEFT VENTRICLE PLAX 2D LVIDd:         5.20 cm LVIDs:         4.60 cm LV PW:         1.20 cm LV IVS:        1.20 cm LVOT diam:     2.10 cm LV SV:         32 ml LV SV Index:   12.71 LVOT Area:     3.46 cm  LV Volumes (MOD) LV area d, A2C:    47.00 cm LV area d, A4C:    59.90 cm LV area s, A2C:    36.30 cm LV area s, A4C:    49.10 cm LV major d, A2C:   9.44 cm LV major d, A4C:   10.20 cm LV major s, A2C:   8.16 cm LV major s, A4C:   9.55 cm LV vol d, MOD A2C: 195.0 ml LV vol d, MOD A4C: 290.0 ml LV vol s, MOD A2C: 136.0 ml LV vol s, MOD A4C: 212.0 ml LV SV MOD A2C:     59.0 ml LV SV MOD A4C:     290.0 ml LV SV MOD BP:      63.5 ml RIGHT VENTRICLE TAPSE (M-mode): 1.3 cm LEFT ATRIUM             Index       RIGHT ATRIUM           Index LA diam:        3.60 cm 1.53 cm/m  RA Area:     21.00 cm LA Vol (A2C):   56.7 ml 24.04 ml/m RA Volume:   59.50 ml  25.23 ml/m LA Vol (A4C):   47.6 ml 20.18 ml/m LA Biplane Vol: 54.2 ml 22.98 ml/m  AORTIC VALVE LVOT Vmax:   117.00 cm/s LVOT Vmean:  69.800 cm/s LVOT VTI:    0.167 m  AORTA Ao Root diam: 3.20 cm TRICUSPID VALVE TR Peak grad:   29.8 mmHg TR Vmax:        273.00 cm/s  SHUNTS Systemic VTI:  0.17 m Systemic Diam: 2.10 cm  Candee Furbish MD Electronically signed by Candee Furbish MD Signature Date/Time: 05/25/2019/8:58:28 AM    Final    ECHOCARDIOGRAM COMPLETE  Result Date: 05/23/2019   ECHOCARDIOGRAM REPORT   Patient Name:   Xavier Doyle Date of Exam: 05/23/2019 Medical Rec #:  [id number]   Height:       68.0 in Accession #:    [id number]  Weight:       270.8 lb Date of Birth:  [birth year]   BSA:          2.32 m Patient  Age:    43 years    BP:           153/94 mmHg Patient Gender: M           HR:           77 bpm. Exam Location:  Inpatient Procedure: 2D Echo,  Cardiac Doppler and Color Doppler Indications:    Chest pain  History:        Patient has no prior history of Echocardiogram examinations.                 Signs/Symptoms:Chest Pain; Risk Factors:Hypertension and                 Dyslipidemia. Alcohol abuse, Cocaine abuse.  Sonographer:    Dustin Flock Referring Phys: [id number] Darreld Mclean  Sonographer Comments: Patient is morbidly obese. IMPRESSIONS  1. Left ventricular ejection fraction, by visual estimation, is 25 to 30%. The left ventricle has severely decreased function. There is moderately increased left ventricular hypertrophy.  2. Left ventricular diastolic parameters are consistent with Grade I diastolic dysfunction (impaired relaxation).  3. The left ventricle demonstrates global hypokinesis.  4. Global right ventricle has mildly reduced systolic function.The right ventricular size is normal. No increase in right ventricular wall thickness.  5. Left atrial size was mildly dilated.  6. Right atrial size was mildly dilated.  7. The mitral valve is normal in structure. Mild mitral valve regurgitation. No evidence of mitral stenosis.  8. The tricuspid valve is normal in structure. Tricuspid valve regurgitation is not demonstrated.  9. The aortic valve is tricuspid. Aortic valve regurgitation is mild. Mild aortic valve sclerosis without stenosis. 10. The inferior vena cava is normal in size with greater than 50% respiratory variability, suggesting right atrial pressure of 3 mmHg. 11. TR signal is inadequate for assessing pulmonary artery systolic pressure. FINDINGS  Left Ventricle: Left ventricular ejection fraction, by visual estimation, is 25 to 30%. The left ventricle has severely decreased function. The left ventricle demonstrates global hypokinesis. The left ventricular internal cavity size was the left ventricle is normal in size. There is moderately increased left ventricular hypertrophy. Left ventricular diastolic parameters are consistent with  Grade I diastolic dysfunction (impaired relaxation). Right Ventricle: The right ventricular size is normal. No increase in right ventricular wall thickness. Global RV systolic function is has mildly reduced systolic function. Left Atrium: Left atrial size was mildly dilated. Right Atrium: Right atrial size was mildly dilated Pericardium: There is no evidence of pericardial effusion. Mitral Valve: The mitral valve is normal in structure. Mild mitral valve regurgitation. No evidence of mitral valve stenosis by observation. Tricuspid Valve: The tricuspid valve is normal in structure. Tricuspid valve regurgitation is not demonstrated. Aortic Valve: The aortic valve is tricuspid. Aortic valve regurgitation is mild. Mild aortic valve sclerosis is present, with no evidence of aortic valve stenosis. Pulmonic Valve: The pulmonic valve was normal in structure. Pulmonic valve regurgitation is not visualized. Pulmonic regurgitation is not visualized. Aorta: The aortic root is normal in size and structure. Venous: The inferior vena cava is normal in size with greater than 50% respiratory variability, suggesting right atrial pressure of 3 mmHg. IAS/Shunts: No atrial level shunt detected by color flow Doppler.  LEFT VENTRICLE PLAX 2D LVIDd:         5.52 cm  Diastology LVIDs:         4.56 cm  LV e' lateral:   5.87 cm/s LV PW:  1.43 cm  LV E/e' lateral: 14.5 LV IVS:        1.49 cm  LV e' medial:    5.55 cm/s LVOT diam:     2.10 cm  LV E/e' medial:  15.4 LV SV:         53 ml LV SV Index:   21.44 LVOT Area:     3.46 cm  RIGHT VENTRICLE RV Basal diam:  2.62 cm RV S prime:     9.68 cm/s TAPSE (M-mode): 3.4 cm LEFT ATRIUM           Index       RIGHT ATRIUM           Index LA diam:      3.80 cm 1.63 cm/m  RA Area:     18.90 cm LA Vol (A2C): 33.0 ml 14.20 ml/m RA Volume:   53.00 ml  22.80 ml/m LA Vol (A4C): 83.1 ml 35.75 ml/m  AORTIC VALVE LVOT Vmax:   83.30 cm/s LVOT Vmean:  55.900 cm/s LVOT VTI:    0.160 m  AORTA Ao Root  diam: 3.10 cm MITRAL VALVE MV Area (PHT): 4.68 cm             SHUNTS MV PHT:        46.98 msec           Systemic VTI:  0.16 m MV Decel Time: 162 msec             Systemic Diam: 2.10 cm MV E velocity: 85.30 cm/s 103 cm/s MV A velocity: 67.30 cm/s 70.3 cm/s MV E/A ratio:  1.27       1.5  Loralie Champagne MD Electronically signed by Loralie Champagne MD Signature Date/Time: 05/23/2019/3:37:26 PM    Final     Cardiac Studies    Cardiac cath did not reveal significant coronary obstruction  Decreased LV systolic function with elevated filling pressures consistent with acute on chronic combined systolic and diastolic heart failure.  Patient Profile     59 y.o. male with a historyof hypertension, hyperlipidemia, asthma, arthritis, anxiety/depression,alcohol abuse, prior cocaine abuse, hypertension, debility due to osteoarthritis, and recently diagnosed nonischemic cardiomyopathy with LVEF less than 35%.  In hospital V. fib cardiac arrest precipitated by R on T.  Prolonged CPR after relatively prolonged downtime (greater than 10 minutes). . Assessment & Plan    1. Ventricular fibrillation cardiac arrest (R on T phenomenon): Prolonged but successful resuscitation less than 30 minutes.  IV amiodarone was being used but has been discontinued because of bradycardia during hypothermia treatment.  Consider reinstituting amiodarone once heart rate increases (if appropriate).  If significant recovery, he will require an AICD. 2. Chronic combined systolic and diastolic heart failure: Guideline directed therapy will be reinstituted when/if he recovers from a neurological standpoint.  Will need diuresis when hemodynamics allow. 3. Respiratory failure, hypoxic: Secondary to cardiac arrest 4. Acute kidney injury: Stage III-IV, contributed to by low flow state from cardiac arrest and pressor therapy.  Track kidney function. 5. Labile hemodynamics: Plan will be to wean pressors as possible maintaining a systolic blood  pressure above 95 mmHg.     For questions or updates, please contact [place] Please consult [email or web address] for contact info under        Signed, Sinclair Grooms, MD  05/25/2019, 11:17 AM

## 2019-05-25 NOTE — Progress Notes (Signed)
EEG maint complete. No skin breakdown at FP1 FP2 F7 F8

## 2019-05-25 NOTE — Progress Notes (Signed)
Inpatient Diabetes Program Recommendations  AACE/ADA: New Consensus Statement on Inpatient Glycemic Control (2015)  Target Ranges:  Prepandial:   less than 140 mg/dL      Peak postprandial:   less than 180 mg/dL (1-2 hours)      Critically ill patients:  140 - 180 mg/dL   Lab Results  Component Value Date   GLUCAP 134 (H) 05/25/2019   HGBA1C 6.4 (H) 05/23/2019    Review of Glycemic Control Results for Xavier Doyle, Xavier Doyle (MRN [id number]) as of 05/25/2019 12:19  Ref. Range 05/25/2019 06:39 05/25/2019 08:39 05/25/2019 12:00  Glucose-Capillary Latest Ref Range: 70 - 99 mg/dL 151 (H) 156 (H) 134 (H)   Diabetes history: None Outpatient Diabetes medications: None Current orders for Inpatient glycemic control:  Levemir 10 units bid  Inpatient Diabetes Program Recommendations:    Patient transitioned off insulin drip overnight.  Currently ordered Levemir 10 units bid. Consider d/c of Levemir and add Novolog 1-3 q 4 hours using the ICU glycemic control order set.   Thanks  Adah Perl, RN, BC-ADM Inpatient Diabetes Coordinator Pager [phone] (8a-5p)

## 2019-05-25 NOTE — Progress Notes (Signed)
FPTS Social Note  Family Medicine will continue to follow Xavier Doyle socially while under the care of CCM. We look forward to resuming the role of the primary team when he is appropriate for the floor.   Stark Klein, MD  PGY1, [facility] Pager [phone]

## 2019-05-25 NOTE — Progress Notes (Signed)
[facility] Progress Note Patient Name: Xavier Doyle DOB: 01/03/1961 MRN: [id number]   Date of Service  05/25/2019  HPI/Events of Note  Low urine output, CVP 14  eICU Interventions  Lasix 20 mg iv x 1        Varnell Donate U Vung Kush 05/25/2019, 3:02 AM

## 2019-05-25 NOTE — Procedures (Addendum)
Patient Name: Xavier Doyle  MRN: [id number]  Epilepsy Attending: Lora Havens  Referring Physician/Provider: Merlene Laughter, NP Duration: 05/30/2019 2048 to 05/25/2019 2048  Patient history: 59yo s/p cardiac arrest now on TTM. EEG to evaluate for seizure  Level of alertness: comatose  AEDs during EEG study: Propofol, Keppra  Technical aspects: This EEG study was done with scalp electrodes positioned according to the 10-20 International system of electrode placement. Electrical activity was acquired at a sampling rate of 500Hz  and reviewed with a high frequency filter of 70Hz  and a low frequency filter of 1Hz . EEG data were recorded continuously and digitally stored.   DESCRIPTION: EEG showed continuous generalized background suppression. Intermittent generalized high amplitude sharply contoured 4-6zh theta slowing was also noted. Hyperventilation and photic stimulation were not performed.  Event button was pressed multiple times (at 2340 and 2349 on 05/26/2019, at 1358, 1402, 1403, 1405, 1406, 1416, 1426, 1445, 1451, 1518, 1530, 1536, 1552, 1609, 1948 on 05/25/2019) for unclear reasons.  Concomitant EEG at times showed high amplitude sharply contoured 4 to 6 Hz theta slowing without clear evolution.  ABNORMALITY - Burst suppression, generalized   IMPRESSION: This study is showed evidence of profound diffuse encephalopathy, non specific to etiology. No seizures or definite epileptiform discharges were seen throughout the recording.     Lavora Brisbon [name]

## 2019-05-25 NOTE — Progress Notes (Signed)
Initial Nutrition Assessment  DOCUMENTATION CODES:   Obesity unspecified  INTERVENTION:   Tube feeding:  -Vital High Protein @ 25 ml/hr via NGT (600 ml) -60 ml Prostat TID  Provides: 1200 kcals (1876 kcal with propofol), 143 grams protein, 502 ml free water.   NUTRITION DIAGNOSIS:   Increased nutrient needs related to acute illness as evidenced by estimated needs.  GOAL:   Patient will meet greater than or equal to 90% of their needs   MONITOR:   Weight trends, Diet advancement, Vent status, Skin, TF tolerance, I & O's  REASON FOR ASSESSMENT:   Ventilator    ASSESSMENT:   Patient with PMH significant for HLD, HTN, OSA, ETOH abuse, and asthma. Presents this admission with new CHF.   1/19- L/R heart cath, s/p PEA arrest, intubated   On TTM 33, set to start rewarming tonight. Requiring paralytics/pressors. Per RN, had a hard time with NGT placement. Stomach felt slightly distended upon assessment. Had BM today. Will attempt discuss trickle feeding with CCM.    Admission weight: 124.7 kg Current weight: 127.1 kg   Patient is currently intubated on ventilator support MV: 9.1 L/min Temp (24hrs), Avg:93 F (33.9 C), Min:90 F (32.2 C), Max:97.9 F (36.6 C)  Propofol: 25.6 ml/hr- provides 676 kcal from lipids daily   I/O: +3,695 ml since admit UOP: 1,275 ml x 245 hrs    Drips: nimbex, dopamine, fentanyl, levophed, propofol Medications: levemir Labs: Cr 1.66- trending down Mg 1.6 (L) CBG 134-156   NUTRITION - FOCUSED PHYSICAL EXAM:    Most Recent Value  Orbital Region  No depletion  Upper Arm Region  No depletion  Thoracic and Lumbar Region  Unable to assess  Buccal Region  No depletion  Temple Region  No depletion  Clavicle Bone Region  No depletion  Clavicle and Acromion Bone Region  No depletion  Scapular Bone Region  Unable to assess  Dorsal Hand  No depletion  Patellar Region  No depletion  Anterior Thigh Region  No depletion  Posterior Calf  Region  No depletion  Edema (RD Assessment)  Mild  Hair  Reviewed  Eyes  Unable to assess  Mouth  Unable to assess  Skin  Reviewed  Nails  Reviewed     Diet Order:   Diet Order    None      EDUCATION NEEDS:   Not appropriate for education at this time  Skin:  Skin Assessment: Reviewed RN Assessment  Last BM:  1/19  Height:   Ht Readings from Last 1 Encounters:  06/05/2019 5\' 8"  (1.727 m)    Weight:   Wt Readings from Last 1 Encounters:  05/25/19 127.1 kg    Ideal Body Weight:  70 kg  BMI:  Body mass index is 42.61 kg/m.  Estimated Nutritional Needs:   Kcal:  1343- 1709 kcal  Protein:  140-175 grams  Fluid:  >/= 1.7 L/day   Mariana Single RD, LDN Clinical Nutrition Pager # - [phone]

## 2019-05-25 NOTE — Consult Note (Addendum)
Went to see patient for evaluation and the patient is intubated and sedated. It was reported that the patient coded yesterday and it may be several days for an evaluation can be done. Please reconsult once patient is extubated and able to communicate, thank you.

## 2019-05-25 NOTE — Progress Notes (Signed)
PULMONARY / CRITICAL CARE MEDICINE   NAME:  Xavier Doyle, MRN:  [id number], DOB:  [birth year], LOS: 1 ADMISSION DATE:  05/08/2019, CONSULTATION DATE:  1/19 REFERRING MD:  Stark Klein, MD CHIEF COMPLAINT:  Cardiac arrest  BRIEF HISTORY:    58-yo-male who presented for chest pain and found to have newly diagnosed systolic heart failure. Underwent cardiac cath which did not demonstrate significant CAD. On day of pending discharge, patient had episode torsades followed by PEA. Code blue was called. ROSC occurred after epi x 3, amiodarone and 2 amps bicarb and defibrillation. PCCM consulted. TTM initiated.   HISTORY OF PRESENT ILLNESS   Patient admitted for chest pain initially after attempting to prevent his mother from falling which he had attributed to muscle spasm. Due to his chest pain he was given ASA and nitro without resolution but with some improvement in symptoms. He was admitted for ACS rule out but his trops remained low and minimally remarkable. However, EKG did demonstrate T-wave inversions in the inferior leads and leads V5-6. Cards was consulted who completed and echo which demonstrated a LVEF of 25-30% with global hypokinesis. L/RHC was unremarkable. It was thought to be hypertensive cardiovascular disease with end-organ damage.   Patient was experiencing chest pain on admit but this was 3/10 the morning of his arrest. He had also completed a R/L heart cath on 05/21/2019 that did not denote marked stenosis.  Was admitted on losartan/HCTZ combo but was not being treated with a statin. He was also prescribed lasix PRN for hypervolemia.  Per nursing staff patient became unresponsive and was noted to be in V-vib at some point. Upon entering the room the patient was noted to be unresponsive. A code blue was called. The patient received epinephrine x3, amiodarone, 2 amps bicarb and defibrillation. There is concern that he was down for ~10 min prior to initiation of CPR. After ~45min of CPR ROSC was  obtained. Patient was started on dopamine at 64mcg/kg at that time.   SIGNIFICANT PAST MEDICAL HISTORY   HTN, HLD, prediabetes, suspected but onconfirmed OSA, tobacco use disorder  SIGNIFICANT EVENTS:  V-vib arrest (torrsades)  TTM initiated on 06/04/2019  STUDIES:   L/R Heart Cath - Minimal stenosis, medical management recommended   CULTURES:  COVD negative  ANTIBIOTICS:  N/A  LINES/TUBES:  ETT 1/19> L CVC 1/19>  CONSULTANTS:  PCCM Cardiology Family Medicine Teaching service following  SUBJECTIVE:  As above  CONSTITUTIONAL: BP 95/61   Pulse (!) 54   Temp (!) 92.5 F (33.6 C) (Bladder)   Resp 18   Ht 5\' 8"  (1.727 m)   Wt 127.1 kg   SpO2 97%   BMI 42.61 kg/m   I/O last 3 completed shifts: In: 3931.2 [P.O.:222; I.V.:3109.2; IV Piggyback:600] Out: 1065 [Urine:1065]  CVP:  [11 mmHg-15 mmHg] 15 mmHg  Vent Mode: PRVC FiO2 (%):  [70 %-100 %] 70 % Set Rate:  [16 bmp-18 bmp] 18 bmp Vt Set:  [480 mL-550 mL] 540 mL PEEP:  [5 cmH20] 5 cmH20 Plateau Pressure:  [24 cmH20-30 cmH20] 30 cmH20  Physical Exam: General: Intubated, sedated, paralyzed due to TTM. HEENT: PETT in place, OG in mouth, CVC base clear on left Cardio: Bradycardic, distant heart sounds  Pulmonary: Bilateral rhonchi, ventilator breathing sounds  Abdomen: Bowel sounds normal, soft MSK: BLE nonedematous Neuro: No response Psych: Unable to assess GU: Foley in place  RESOLVED PROBLEM LIST   ASSESSMENT AND PLAN   V-tach arrest: Cardiogenic Shock: Etiology uncertain. Repeat post code cardiac  echo with initial read indicating likely unchanged EF. Amiodarone gtt held overnight due to bradycardia.  Plan --Continue TTM --Holding amiodarone gtt per cards due to bradycardia --Wean dopamine gtt --Trend troponin 116>13k --Cardiology following. Appreciate recommendations   HFrEF: New diagnosis thought to be 2/2 to uncontrolled HTN. L/RHC unremarkable. Hold anti-hypertensive agents in the setting of  TTM. Plan --Continue 33 CC TTM per protocol --Continue close monitoring of vitals  Acute hypoxemic respiratory failure  2/2 to cardiac arrest. TTM so will need full vent support. ABG today 7.345/40/118/20.  Plan --Daily ABG --Continue current vent setting  Acute encephalopathy in setting of cardiac arrest Plan --Supportive management as above --PAD protocol for goal RASS -4 during TTM  Hypokalemia Replete PRN  Acute renal injury Etiology-shock 2/2 to low perfusion during cardiac arrest.  Plan: --Continue to monitor renal function daily --Currently no indication for CRRT  Best Practice / Goals of Care / Disposition.   DVT PROPHYLAXIS: Heparin NUTRITION: NPO MOBILITY: BR GOALS OF CARE: Full FAMILY DISCUSSIONS: Will update DISPOSITION: Admit to ICU  LABS  Glucose Recent Labs  Lab 05/25/19 0131 05/25/19 0234 05/25/19 0344 05/25/19 0431 05/25/19 0533 05/25/19 0639  GLUCAP 203* 166* 155* 148* 139* 151*    BMET Recent Labs  Lab 05/12/2019 0708 05/27/2019 1123 05/17/2019 1733 05/10/2019 2028 05/25/19 0019 05/25/19 0019 05/25/19 0236 05/25/19 0413 05/25/19 0455  NA 140   < > 138   < > 140   < > 139 140 137  K 4.0   < > 3.3*   < > 3.6   < > 3.7 3.7 3.9  CL 104   < > 100   < > 104  --  105  --  104  CO2 26  --  22  --   --   --   --   --  20*  BUN 13   < > 14   < > 23*  --  23*  --  26*  CREATININE 0.90   < > 1.35*   < > 1.50*  --  1.60*  --  1.82*  GLUCOSE 107*   < > 232*   < > 251*  --  191*  --  155*   < > = values in this interval not displayed.    Liver Enzymes Recent Labs  Lab 05/25/19 0455  AST 328*  ALT 163*  ALKPHOS 95  BILITOT 0.6  ALBUMIN 3.2*    Electrolytes Recent Labs  Lab 05/16/2019 0708 05/31/2019 1707 05/14/2019 1733 05/15/2019 1934 06/05/2019 2339 05/25/19 0455  CALCIUM 9.1  --  8.1*  --   --  8.1*  MG  --  2.2  --   --  1.7 1.8  PHOS  --   --   --  5.5*  --  3.7    CBC Recent Labs  Lab 05/22/2019 0708 05/10/2019 1123 05/31/2019 1733  05/13/2019 2028 05/25/19 0236 05/25/19 0413 05/25/19 0455  WBC 6.7  --  10.2  --   --   --  14.7*  HGB 12.4*   < > 11.7*   < > 15.0 14.3 13.0  HCT 41.9   < > 40.7   < > 44.0 42.0 44.8  PLT 275  --  265  --   --   --  306   < > = values in this interval not displayed.    ABG Recent Labs  Lab 05/16/2019 1130 05/10/2019 2028 05/25/19 0413  PHART 7.384 7.253* 7.345*  PCO2ART 43.9 64.7* 40.0  PO2ART 81.0* 66.0* 118.0*    Coag's Recent Labs  Lab 06/03/2019 1733 05/25/19 0455  APTT 27 28  INR 1.1 1.0    Sepsis Markers No results for input(s): LATICACIDVEN, PROCALCITON, O2SATVEN in the last 168 hours.  Cardiac Enzymes No results for input(s): TROPONINI, PROBNP in the last 168 hours.  PAST MEDICAL HISTORY :   He  has a past medical history of Allergy, Anxiety, Arthritis, Asthma, Depression, Heart murmur, Hyperlipidemia, Hypertension, and Osteoporosis.  PAST SURGICAL HISTORY:  He  has a past surgical history that includes No past surgeries and RIGHT/LEFT HEART CATH AND CORONARY ANGIOGRAPHY (N/A, 05/21/2019).  No Known Allergies  No current facility-administered medications on file prior to encounter.   Current Outpatient Medications on File Prior to Encounter  Medication Sig  . furosemide (LASIX) 40 MG tablet Take 40 mg by mouth daily as needed for fluid.  Marland Kitchen losartan-hydrochlorothiazide (HYZAAR) 100-25 MG tablet Take 1 tablet by mouth daily.   Marland Kitchen OVER THE COUNTER MEDICATION Neutragenix daily  . OVER THE COUNTER MEDICATION Take 1 tablet by mouth daily. Keto diet pill   . PROAIR HFA 108 (90 Base) MCG/ACT inhaler Inhale 2 puffs into the lungs every 6 (six) hours as needed for wheezing.   . tamsulosin (FLOMAX) 0.4 MG CAPS capsule Take 0.4 mg by mouth.    FAMILY HISTORY:   His family history is negative for Colon cancer, Colon polyps, Esophageal cancer, Rectal cancer, and Stomach cancer.  SOCIAL HISTORY:  He  reports that he has quit smoking. He has never used smokeless tobacco.  He reports current alcohol use. He reports current drug use. Drug: "Crack" cocaine.  REVIEW OF SYSTEMS:    Unable to obtain due to critical illness

## 2019-05-25 NOTE — Progress Notes (Signed)
  Echocardiogram 2D Echocardiogram has been performed.  Xavier Doyle 05/25/2019, 8:26 AM

## 2019-05-26 ENCOUNTER — Inpatient Hospital Stay (HOSPITAL_COMMUNITY): Payer: Medicare Other

## 2019-05-26 LAB — COMPREHENSIVE METABOLIC PANEL
ALT: 130 U/L — ABNORMAL HIGH (ref 0–44)
AST: 235 U/L — ABNORMAL HIGH (ref 15–41)
Albumin: 2.8 g/dL — ABNORMAL LOW (ref 3.5–5.0)
Alkaline Phosphatase: 86 U/L (ref 38–126)
Anion gap: 12 (ref 5–15)
BUN: 24 mg/dL — ABNORMAL HIGH (ref 6–20)
CO2: 19 mmol/L — ABNORMAL LOW (ref 22–32)
Calcium: 7.9 mg/dL — ABNORMAL LOW (ref 8.9–10.3)
Chloride: 108 mmol/L (ref 98–111)
Creatinine, Ser: 1.51 mg/dL — ABNORMAL HIGH (ref 0.61–1.24)
GFR calc Af Amer: 58 mL/min — ABNORMAL LOW (ref >60)
GFR calc non Af Amer: 50 mL/min — ABNORMAL LOW (ref >60)
Glucose, Bld: 132 mg/dL — ABNORMAL HIGH (ref 70–99)
Potassium: 3.6 mmol/L (ref 3.5–5.1)
Sodium: 139 mmol/L (ref 135–145)
Total Bilirubin: 0.3 mg/dL (ref 0.3–1.2)
Total Protein: 6.3 g/dL — ABNORMAL LOW (ref 6.5–8.1)

## 2019-05-26 LAB — POCT I-STAT, CHEM 8
BUN: 23 mg/dL — ABNORMAL HIGH (ref 6–20)
BUN: 22 mg/dL — ABNORMAL HIGH (ref 6–20)
BUN: 22 mg/dL — ABNORMAL HIGH (ref 6–20)
BUN: 23 mg/dL — ABNORMAL HIGH (ref 6–20)
Calcium, Ion: 1.11 mmol/L — ABNORMAL LOW (ref 1.15–1.40)
Calcium, Ion: 1.13 mmol/L — ABNORMAL LOW (ref 1.15–1.40)
Calcium, Ion: 1.12 mmol/L — ABNORMAL LOW (ref 1.15–1.40)
Calcium, Ion: 1.12 mmol/L — ABNORMAL LOW (ref 1.15–1.40)
Chloride: 108 mmol/L (ref 98–111)
Chloride: 107 mmol/L (ref 98–111)
Chloride: 108 mmol/L (ref 98–111)
Chloride: 106 mmol/L (ref 98–111)
Creatinine, Ser: 1.5 mg/dL — ABNORMAL HIGH (ref 0.61–1.24)
Creatinine, Ser: 1.4 mg/dL — ABNORMAL HIGH (ref 0.61–1.24)
Creatinine, Ser: 1.5 mg/dL — ABNORMAL HIGH (ref 0.61–1.24)
Creatinine, Ser: 1.5 mg/dL — ABNORMAL HIGH (ref 0.61–1.24)
Glucose, Bld: 133 mg/dL — ABNORMAL HIGH (ref 70–99)
Glucose, Bld: 135 mg/dL — ABNORMAL HIGH (ref 70–99)
Glucose, Bld: 131 mg/dL — ABNORMAL HIGH (ref 70–99)
Glucose, Bld: 124 mg/dL — ABNORMAL HIGH (ref 70–99)
HCT: 41 % (ref 39.0–52.0)
HCT: 41 % (ref 39.0–52.0)
HCT: 40 % (ref 39.0–52.0)
HCT: 44 % (ref 39.0–52.0)
Hemoglobin: 13.9 g/dL (ref 13.0–17.0)
Hemoglobin: 13.9 g/dL (ref 13.0–17.0)
Hemoglobin: 13.6 g/dL (ref 13.0–17.0)
Hemoglobin: 15 g/dL (ref 13.0–17.0)
Potassium: 3.6 mmol/L (ref 3.5–5.1)
Potassium: 3.9 mmol/L (ref 3.5–5.1)
Potassium: 3.9 mmol/L (ref 3.5–5.1)
Potassium: 4.1 mmol/L (ref 3.5–5.1)
Sodium: 138 mmol/L (ref 135–145)
Sodium: 139 mmol/L (ref 135–145)
Sodium: 139 mmol/L (ref 135–145)
Sodium: 140 mmol/L (ref 135–145)
TCO2: 20 mmol/L — ABNORMAL LOW (ref 22–32)
TCO2: 22 mmol/L (ref 22–32)
TCO2: 22 mmol/L (ref 22–32)
TCO2: 24 mmol/L (ref 22–32)

## 2019-05-26 LAB — POCT I-STAT 7, (LYTES, BLD GAS, ICA,H+H)
Acid-base deficit: 5 mmol/L — ABNORMAL HIGH (ref 0.0–2.0)
Acid-base deficit: 4 mmol/L — ABNORMAL HIGH (ref 0.0–2.0)
Acid-base deficit: 6 mmol/L — ABNORMAL HIGH (ref 0.0–2.0)
Bicarbonate: 20.6 mmol/L (ref 20.0–28.0)
Bicarbonate: 25.1 mmol/L (ref 20.0–28.0)
Bicarbonate: 22.1 mmol/L (ref 20.0–28.0)
Calcium, Ion: 1.11 mmol/L — ABNORMAL LOW (ref 1.15–1.40)
Calcium, Ion: 1.1 mmol/L — ABNORMAL LOW (ref 1.15–1.40)
Calcium, Ion: 1.06 mmol/L — ABNORMAL LOW (ref 1.15–1.40)
HCT: 40 % (ref 39.0–52.0)
HCT: 43 % (ref 39.0–52.0)
HCT: 39 % (ref 39.0–52.0)
Hemoglobin: 13.6 g/dL (ref 13.0–17.0)
Hemoglobin: 14.6 g/dL (ref 13.0–17.0)
Hemoglobin: 13.3 g/dL (ref 13.0–17.0)
O2 Saturation: 94 %
O2 Saturation: 98 %
O2 Saturation: 89 %
Patient temperature: 34.3
Patient temperature: 37.6
Patient temperature: 37.1
Potassium: 3.7 mmol/L (ref 3.5–5.1)
Potassium: 4.4 mmol/L (ref 3.5–5.1)
Potassium: 4 mmol/L (ref 3.5–5.1)
Sodium: 139 mmol/L (ref 135–145)
Sodium: 140 mmol/L (ref 135–145)
Sodium: 140 mmol/L (ref 135–145)
TCO2: 22 mmol/L (ref 22–32)
TCO2: 27 mmol/L (ref 22–32)
TCO2: 24 mmol/L (ref 22–32)
pCO2 arterial: 36.3 mmHg (ref 32.0–48.0)
pCO2 arterial: 63.3 mmHg — ABNORMAL HIGH (ref 32.0–48.0)
pCO2 arterial: 52.7 mmHg — ABNORMAL HIGH (ref 32.0–48.0)
pH, Arterial: 7.348 — ABNORMAL LOW (ref 7.350–7.450)
pH, Arterial: 7.21 — ABNORMAL LOW (ref 7.350–7.450)
pH, Arterial: 7.231 — ABNORMAL LOW (ref 7.350–7.450)
pO2, Arterial: 66 mmHg — ABNORMAL LOW (ref 83.0–108.0)
pO2, Arterial: 141 mmHg — ABNORMAL HIGH (ref 83.0–108.0)
pO2, Arterial: 69 mmHg — ABNORMAL LOW (ref 83.0–108.0)

## 2019-05-26 LAB — BASIC METABOLIC PANEL
Anion gap: 11 (ref 5–15)
BUN: 23 mg/dL — ABNORMAL HIGH (ref 6–20)
CO2: 22 mmol/L (ref 22–32)
Calcium: 8 mg/dL — ABNORMAL LOW (ref 8.9–10.3)
Chloride: 103 mmol/L (ref 98–111)
Creatinine, Ser: 2.21 mg/dL — ABNORMAL HIGH (ref 0.61–1.24)
GFR calc Af Amer: 37 mL/min — ABNORMAL LOW (ref >60)
GFR calc non Af Amer: 32 mL/min — ABNORMAL LOW (ref >60)
Glucose, Bld: 116 mg/dL — ABNORMAL HIGH (ref 70–99)
Potassium: 4.4 mmol/L (ref 3.5–5.1)
Sodium: 136 mmol/L (ref 135–145)

## 2019-05-26 LAB — CBC
HCT: 42.2 % (ref 39.0–52.0)
HCT: 43.8 % (ref 39.0–52.0)
Hemoglobin: 12.5 g/dL — ABNORMAL LOW (ref 13.0–17.0)
Hemoglobin: 12.9 g/dL — ABNORMAL LOW (ref 13.0–17.0)
MCH: 23.2 pg — ABNORMAL LOW (ref 26.0–34.0)
MCH: 23.2 pg — ABNORMAL LOW (ref 26.0–34.0)
MCHC: 29.6 g/dL — ABNORMAL LOW (ref 30.0–36.0)
MCHC: 29.5 g/dL — ABNORMAL LOW (ref 30.0–36.0)
MCV: 78.4 fL — ABNORMAL LOW (ref 80.0–100.0)
MCV: 78.8 fL — ABNORMAL LOW (ref 80.0–100.0)
Platelets: 255 10*3/uL (ref 150–400)
Platelets: 258 10*3/uL (ref 150–400)
RBC: 5.38 MIL/uL (ref 4.22–5.81)
RBC: 5.56 MIL/uL (ref 4.22–5.81)
RDW: 16.4 % — ABNORMAL HIGH (ref 11.5–15.5)
RDW: 16.8 % — ABNORMAL HIGH (ref 11.5–15.5)
WBC: 10.4 10*3/uL (ref 4.0–10.5)
WBC: 14.8 10*3/uL — ABNORMAL HIGH (ref 4.0–10.5)
nRBC: 0 % (ref 0.0–0.2)
nRBC: 0 % (ref 0.0–0.2)

## 2019-05-26 LAB — GLUCOSE, CAPILLARY
Glucose-Capillary: 103 mg/dL — ABNORMAL HIGH (ref 70–99)
Glucose-Capillary: 104 mg/dL — ABNORMAL HIGH (ref 70–99)
Glucose-Capillary: 108 mg/dL — ABNORMAL HIGH (ref 70–99)
Glucose-Capillary: 117 mg/dL — ABNORMAL HIGH (ref 70–99)
Glucose-Capillary: 119 mg/dL — ABNORMAL HIGH (ref 70–99)
Glucose-Capillary: 125 mg/dL — ABNORMAL HIGH (ref 70–99)
Glucose-Capillary: 130 mg/dL — ABNORMAL HIGH (ref 70–99)

## 2019-05-26 LAB — MAGNESIUM: Magnesium: 2 mg/dL (ref 1.7–2.4)

## 2019-05-26 LAB — TRIGLYCERIDES: Triglycerides: 417 mg/dL — ABNORMAL HIGH (ref <150)

## 2019-05-26 LAB — PHOSPHORUS: Phosphorus: 3.9 mg/dL (ref 2.5–4.6)

## 2019-05-26 MED ORDER — FUROSEMIDE 10 MG/ML IJ SOLN
80.0000 mg | Freq: Two times a day (BID) | INTRAMUSCULAR | Status: DC
  Administered 2019-05-26: 80 mg via INTRAVENOUS
  Filled 2019-05-26: qty 8

## 2019-05-26 MED ORDER — VANCOMYCIN HCL 1500 MG/300ML IV SOLN: 1500.0000 mg | INTRAVENOUS | Status: DC

## 2019-05-26 MED ORDER — VITAL HIGH PROTEIN PO LIQD: 1000.0000 mL | ORAL | Status: DC

## 2019-05-26 MED ORDER — LEVALBUTEROL HCL 0.63 MG/3ML IN NEBU
INHALATION_SOLUTION | RESPIRATORY_TRACT | Status: AC
  Administered 2019-05-26: 0.63 mg
  Filled 2019-05-26: qty 3

## 2019-05-26 MED ORDER — ASPIRIN 81 MG PO CHEW
81.0000 mg | CHEWABLE_TABLET | Freq: Every day | ORAL | Status: DC
  Administered 2019-05-26 – 2019-06-12 (×18): 81 mg
  Filled 2019-05-26 (×18): qty 1

## 2019-05-26 MED ORDER — SODIUM CHLORIDE 0.9 % IV SOLN
2.0000 g | Freq: Two times a day (BID) | INTRAVENOUS | Status: DC
  Administered 2019-05-27 – 2019-05-28 (×3): 2 g via INTRAVENOUS
  Filled 2019-05-26 (×4): qty 2

## 2019-05-26 MED ORDER — VITAL HIGH PROTEIN PO LIQD
1000.0000 mL | ORAL | Status: DC
  Administered 2019-05-26: 1000 mL

## 2019-05-26 MED ORDER — CALCIUM GLUCONATE-NACL 2-0.675 GM/100ML-% IV SOLN
2.0000 g | Freq: Once | INTRAVENOUS | Status: AC
  Administered 2019-05-27: 2000 mg via INTRAVENOUS
  Filled 2019-05-26: qty 100

## 2019-05-26 MED ORDER — SODIUM CHLORIDE 0.9 % IV SOLN
2.0000 g | Freq: Three times a day (TID) | INTRAVENOUS | Status: DC
  Administered 2019-05-26: 2 g via INTRAVENOUS
  Filled 2019-05-26 (×3): qty 2

## 2019-05-26 MED ORDER — VANCOMYCIN HCL IN DEXTROSE 1-5 GM/200ML-% IV SOLN
1000.0000 mg | INTRAVENOUS | Status: DC
  Administered 2019-05-27: 1000 mg via INTRAVENOUS
  Filled 2019-05-26: qty 200

## 2019-05-26 MED ORDER — METOLAZONE 5 MG PO TABS
5.0000 mg | ORAL_TABLET | Freq: Once | ORAL | Status: AC
  Administered 2019-05-26: 5 mg via ORAL
  Filled 2019-05-26: qty 1

## 2019-05-26 MED ORDER — ATORVASTATIN CALCIUM 80 MG PO TABS
80.0000 mg | ORAL_TABLET | Freq: Every day | ORAL | Status: DC
  Administered 2019-05-26 – 2019-06-11 (×17): 80 mg
  Filled 2019-05-26 (×17): qty 1

## 2019-05-26 MED ORDER — FUROSEMIDE 10 MG/ML IJ SOLN
5.0000 mg/h | INTRAVENOUS | Status: DC
  Administered 2019-05-26: 4 mg/h via INTRAVENOUS
  Administered 2019-05-28 – 2019-05-30 (×3): 10 mg/h via INTRAVENOUS
  Administered 2019-05-31: 20:00:00 5 mg/h via INTRAVENOUS
  Filled 2019-05-26: qty 21
  Filled 2019-05-26 (×2): qty 25
  Filled 2019-05-26: qty 21
  Filled 2019-05-26 (×3): qty 25

## 2019-05-26 MED ORDER — PRO-STAT SUGAR FREE PO LIQD
30.0000 mL | Freq: Two times a day (BID) | ORAL | Status: DC
  Administered 2019-05-26 – 2019-05-27 (×3): 30 mL
  Filled 2019-05-26 (×2): qty 30

## 2019-05-26 MED ORDER — ACETAMINOPHEN 325 MG PO TABS
650.0000 mg | ORAL_TABLET | ORAL | Status: DC | PRN
  Administered 2019-05-26 – 2019-06-10 (×9): 650 mg
  Filled 2019-05-26 (×9): qty 2

## 2019-05-26 MED ORDER — MIDAZOLAM 50MG/50ML (1MG/ML) PREMIX INFUSION
0.0000 mg/h | INTRAVENOUS | Status: DC
  Administered 2019-05-26: 1 mg/h via INTRAVENOUS
  Administered 2019-05-26 – 2019-05-27 (×4): 10 mg/h via INTRAVENOUS
  Administered 2019-05-27: 16:00:00 5 mg/h via INTRAVENOUS
  Administered 2019-05-27 – 2019-05-28 (×5): 10 mg/h via INTRAVENOUS
  Administered 2019-05-29: 16:00:00 15 mg/h via INTRAVENOUS
  Administered 2019-05-29: 10 mg/h via INTRAVENOUS
  Administered 2019-05-29: 20:00:00 17 mg/h via INTRAVENOUS
  Administered 2019-05-29: 15 mg/h via INTRAVENOUS
  Administered 2019-05-29: 17 mg/h via INTRAVENOUS
  Administered 2019-05-29: 09:00:00 10 mg/h via INTRAVENOUS
  Administered 2019-05-30: 15 mg/h via INTRAVENOUS
  Administered 2019-05-30: 02:00:00 16 mg/h via INTRAVENOUS
  Administered 2019-05-30 – 2019-05-31 (×4): 15 mg/h via INTRAVENOUS
  Administered 2019-05-31: 20 mg/h via INTRAVENOUS
  Administered 2019-05-31 – 2019-06-01 (×4): 15 mg/h via INTRAVENOUS
  Administered 2019-06-01: 18 mg/h via INTRAVENOUS
  Administered 2019-06-01: 15 mg/h via INTRAVENOUS
  Administered 2019-06-01: 25 mg/h via INTRAVENOUS
  Administered 2019-06-01: 18 mg/h via INTRAVENOUS
  Administered 2019-06-01: 25 mg/h via INTRAVENOUS
  Administered 2019-06-01: 15 mg/h via INTRAVENOUS
  Administered 2019-06-02: 22 mg/h via INTRAVENOUS
  Administered 2019-06-02: 21:00:00 25 mg/h via INTRAVENOUS
  Administered 2019-06-02: 06:00:00 20 mg/h via INTRAVENOUS
  Administered 2019-06-02: 01:00:00 25 mg/h via INTRAVENOUS
  Administered 2019-06-02: 22 mg/h via INTRAVENOUS
  Administered 2019-06-02: 04:00:00 30 mg/h via INTRAVENOUS
  Administered 2019-06-02 (×3): 21 mg/h via INTRAVENOUS
  Administered 2019-06-02: 25 mg/h via INTRAVENOUS
  Administered 2019-06-03: 02:00:00 20 mg/h via INTRAVENOUS
  Administered 2019-06-03 – 2019-06-06 (×8): 10 mg/h via INTRAVENOUS
  Administered 2019-06-07: 16:00:00 4 mg/h via INTRAVENOUS
  Administered 2019-06-08 – 2019-06-10 (×3): 2 mg/h via INTRAVENOUS
  Administered 2019-06-10: 5 mg/h via INTRAVENOUS
  Administered 2019-06-10: 19:00:00 4 mg/h via INTRAVENOUS
  Administered 2019-06-11 (×2): 5 mg/h via INTRAVENOUS
  Administered 2019-06-12 (×2): 6 mg/h via INTRAVENOUS
  Administered 2019-06-12: 17:00:00 30 mg/h via INTRAVENOUS
  Filled 2019-05-26 (×9): qty 50
  Filled 2019-05-26: qty 100
  Filled 2019-05-26 (×3): qty 50
  Filled 2019-05-26: qty 100
  Filled 2019-05-26: qty 50
  Filled 2019-05-26: qty 100
  Filled 2019-05-26 (×7): qty 50
  Filled 2019-05-26: qty 100
  Filled 2019-05-26 (×10): qty 50
  Filled 2019-05-26: qty 100
  Filled 2019-05-26 (×6): qty 50
  Filled 2019-05-26: qty 100
  Filled 2019-05-26 (×13): qty 50
  Filled 2019-05-26: qty 100
  Filled 2019-05-26 (×16): qty 50

## 2019-05-26 MED ORDER — FUROSEMIDE 10 MG/ML IJ SOLN
40.0000 mg | Freq: Once | INTRAMUSCULAR | Status: AC
  Administered 2019-05-26: 40 mg via INTRAVENOUS
  Filled 2019-05-26: qty 4

## 2019-05-26 MED ORDER — METOPROLOL TARTRATE 5 MG/5ML IV SOLN
5.0000 mg | Freq: Once | INTRAVENOUS | Status: AC
  Administered 2019-05-26: 5 mg via INTRAVENOUS
  Filled 2019-05-26: qty 5

## 2019-05-26 MED ORDER — VANCOMYCIN HCL 10 G IV SOLR
2500.0000 mg | Freq: Once | INTRAVENOUS | Status: AC
  Administered 2019-05-26: 2500 mg via INTRAVENOUS
  Filled 2019-05-26: qty 2500

## 2019-05-26 NOTE — Progress Notes (Addendum)
PULMONARY / CRITICAL CARE MEDICINE   NAME:  Xavier Doyle, MRN:  [id number], DOB:  May 14, 1960, LOS: 2 ADMISSION DATE:  05/28/2019, CONSULTATION DATE: 05/16/2019 REFERRING MD: Family medicine teaching service, CHIEF COMPLAINT: Cardiac arrest  BRIEF HISTORY:    Xavier Doyle is a 59 year old male with hypertension, hyperlipidemia, asthma, depression who was admitted on 05/23/2019 for chest pain.  Patient was found to have newly diagnosed systolic heart failure with EF of 25-30% with global hypokinesis.  Left and right heart catheterization did not show any ischemic cardiac disease.  Patient's chest pain was thought to be due to hypertensive endorgan damage.  During hospitalization patient had a cardiac arrest.  He was found to be in torsades which converted into PEA.  Patient received epi x3, amiodarone, bicarb x2, defibrillation.  ROSC was obtained and patient was started on TTM along with dopamine.   SIGNIFICANT PAST MEDICAL HISTORY    Allergy, Anxiety, Arthritis, Asthma, Depression, Heart murmur, Hyperlipidemia, Hypertension, and Osteoporosis.  SIGNIFICANT EVENTS:   1/17 admitted to [facility] for chest pain 1/19 V. fib arrest subsequent to torsades, TTM initiated 1/20 Repeat TTE without new changes   STUDIES:    TTE 05/23/2019 Left ventricular ejection fraction, by visual estimation, is 25 to 30%. The left ventricle has severely decreased function. There is moderately increased left ventricular hypertrophy. 2. Left ventricular diastolic parameters are consistent with Grade I diastolic dysfunction (impaired relaxation). 3. The left ventricle demonstrates global hypokinesis. 4. Global right ventricle has mildly reduced systolic function.The right ventricular size is normal. No increase in right ventricular wall thickness. 5. Left atrial size was mildly dilated. 6. Right atrial size was mildly dilated. 7. The mitral valve is normal in structure. Mild mitral valve regurgitation. No evidence of mitral  stenosis. 8. The tricuspid valve is normal in structure. Tricuspid valve regurgitation is not demonstrated. 9. The aortic valve is tricuspid. Aortic valve regurgitation is mild. Mild aortic valve sclerosis without stenosis. 10. The inferior vena cava is normal in size with greater than 50% respiratory variability, suggesting right atrial pressure of 3 mmHg. 11. TR signal is inadequate for assessing pulmonary artery systolic pressure.  TTE 05/24/2018  Left ventricular ejection fraction, by visual estimation, is 25 to 30%. The left ventricle has  severely decreased function. There is mildly increased left ventricular hypertrophy. 2. Left ventricular diastolic parameters are consistent with Grade I diastolic dysfunction (impaired relaxation). 3. The left ventricle demonstrates global hypokinesis. 4. Global right ventricle has moderately reduced systolic function.The right ventricular size is normal. No increase in right ventricular wall thickness. 5. Left atrial size was normal. 6. Right atrial size was normal. 7. The mitral valve is normal in structure. Trivial mitral valve regurgitation. No evidence of mitral stenosis. 8. The tricuspid valve is normal in structure. 9. The tricuspid valve is normal in structure. Tricuspid valve regurgitation is trivial. 10. The aortic valve is tricuspid. Aortic valve regurgitation is trivial. No evidence of aortic valve sclerosis or stenosis. 11. The pulmonic valve was normal in structure. Pulmonic valve regurgitation is not visualized. 12. Moderately elevated pulmonary artery systolic pressure. 13. The tricuspid regurgitant velocity is 2.73 m/s, and with an assumed right atrial pressure of 15 mmHg, the estimated right ventricular systolic pressure is moderately elevated at 44.8 mmHg. 14. The inferior vena cava is dilated in size with <50% respiratory variability, suggesting right atrial pressure of 15 mmHg. 15. No significant change from prior  study.  Right/left heart cath 05/24/2019  Mid RCA lesion is 10% stenosed. No significant  CAD.  LV end diastolic pressure is moderately elevated. LVEDP 26 mm Hg.  There is no aortic valve stenosis.  Hemodynamic findings consistent with mild pulmonary hypertension.  Ao sat 96%, PA sat 67%, mean PA pressure 29 mm Hg; mean PCWP 20; CO 6.3 L/min, CI 2.72   Medical therapy for nonischemic cardiomyopathy.  CULTURES:   Covid negative  ANTIBIOTICS:    LINES/TUBES:  ETT 1/19 L CVC 1/19   CONSULTANTS:  PCCM Cardiology  SUBJECTIVE:  Patient is sedated and intubated. According to nurse she has been able to titrate down levo, occasional pacs with hemodynamic stability.  CONSTITUTIONAL: BP 125/78   Pulse (!) 42   Temp (!) 94.5 F (34.7 C) (Bladder)   Resp 18   Ht 5\' 8"  (1.727 m)   Wt 125.2 kg   SpO2 100%   BMI 41.97 kg/m   I/O last 3 completed shifts: In: 5310.6 [I.V.:4260.7; IV Piggyback:1050] Out: 3010 [Urine:2710; Emesis/NG output:300]  CVP:  [6 mmHg-11 mmHg] 11 mmHg  Vent Mode: PRVC FiO2 (%):  [50 %-60 %] 50 % Set Rate:  [18 bmp] 18 bmp Vt Set:  [540 mL] 540 mL PEEP:  [10 cmH20] 10 cmH20 Plateau Pressure:  [28 cmH20-30 cmH20] 28 cmH20  PHYSIAM: General:  Patient sedated and intubated resting comfortably Neuro:  Patient is sedated, perrla, corneal reflex absent HEENT:  Et tube present Cardiovascular:  RRR, s1 and s2 audible, dp pulses 2+ bilaterally Lungs: diffuse rhonchi Abdomen: protuberant abdomen, soft Musculoskeletal:  No pitting edema Skin:  Cool extremities   ASSESSMENT AND PLAN    Cardiogenic shock secondary to V. fib arrest on 05/18/2019 Because of patient's refusal.  Was placed on wound.  TTE was repeated after the cardiac arrest did not show any new findings with continued EF 25-30%.  -Continue TTM protocol - nimbex, eeg, paralytic -triglycerides 417, spoke to pharmacist regarding titrating propofol dose due to concern for propofol infusion  syndrome. Will stop propofol and switch to versed. No P[place]lia urine. -amiodarone discontinued due to low blood pressures, can restart if heartrates improve -titrate levophed  -titrate dopapime  -Maintain MAP>65 -Cardiology following appreciate recommendations -continue atorvastatin 80mg  qd   HFrEF TTE showing EF of 25-30% with g1dd.  -will need to start on IV lasix when pressures improve  -Will likely need ICD for optimal managment  Acute hypoxemic respiratory failure secondary to cardiac arrest Ph 7.348, po2 66, pco2 36, bicarb 20.6. On PRVC Fio2 50%, rr 18, peep 10, TV 540  -Elevate head of bed -VAP protocol -Daily ABG  Acute encephalopathy secondary to hypoxia as result of cardiac arrest  -continuous EEG while on TTM, according to epileptologist read 1/20 the patient has profound diffuse encephalopaty without seizures or epileptiform discharges  -Continue ventilatory support -keppra for seizure precaution -Continue to do frequent neurological checks  Transaminitis  AST 235, ALT 130. Downtrending. Likely in setting of cardiogenic shock.  -can get hepatitis serologies if liver enzymes do not improve  Diabetes mellitus  A1C 6.4   -continue levemir 10u bid  AKI Likely prerenal, improving   Cr 1.5>1.4  Best Practice / Goals of Care / Disposition.   DVT PROPHYLAXIS: heparin sq SUP: protonix NUTRITION: npo MOBILITY: bedrest GOALS OF CARE: full FAMILY DISCUSSIONS: will speak to  DISPOSITION ICU  LABS  Glucose Recent Labs  Lab 05/25/19 1200 05/25/19 1614 05/25/19 2013 05/25/19 2220 05/26/19 0021 05/26/19 0427  GLUCAP 134* 116* 119* 113* 117* 125*    BMET Recent Labs  Lab 05/25/19 1423 05/25/19  1423 05/25/19 2019 05/25/19 2019 05/26/19 0230 05/26/19 0430 05/26/19 0532  NA 136   < > 138   < > 139 138 139  K 4.5   < > 3.6   < > 3.6 3.6 3.7  CL 105   < > 108  --  108 108  --   CO2 20*  --  19*  --  19*  --   --   BUN 25*   < > 27*  --  24* 23*  --    CREATININE 1.67*   < > 1.57*  --  1.51* 1.50*  --   GLUCOSE 130*   < > 127*  --  132* 133*  --    < > = values in this interval not displayed.    Liver Enzymes Recent Labs  Lab 05/25/19 1423 05/25/19 2019 05/26/19 0230  AST 296* 267* 235*  ALT 146* 142* 130*  ALKPHOS 90 87 86  BILITOT 0.6 0.4 0.3  ALBUMIN 2.8* 2.8* 2.8*    Electrolytes Recent Labs  Lab 05/25/2019 1934 06/01/2019 2339 05/25/19 0455 05/25/19 0455 05/25/19 0843 05/25/19 0843 05/25/19 1423 05/25/19 2019 05/26/19 0230 05/26/19 0438  CALCIUM  --   --  8.1*   < > 8.0*   < > 7.8* 7.8* 7.9*  --   MG  --    < > 1.8   < > 1.6*  --   --  2.1  --  2.0  PHOS 5.5*  --  3.7  --   --   --   --   --   --  3.9   < > = values in this interval not displayed.    CBC Recent Labs  Lab 05/09/2019 1733 05/20/2019 2028 05/25/19 0455 05/25/19 0841 05/26/19 0430 05/26/19 0438 05/26/19 0532  WBC 10.2  --  14.7*  --   --  10.4  --   HGB 11.7*   < > 13.0   < > 13.9 12.5* 13.6  HCT 40.7   < > 44.8   < > 41.0 42.2 40.0  PLT 265  --  306  --   --  255  --    < > = values in this interval not displayed.    ABG Recent Labs  Lab 05/30/2019 2028 05/25/19 0413 05/26/19 0532  PHART 7.253* 7.345* 7.348*  PCO2ART 64.7* 40.0 36.3  PO2ART 66.0* 118.0* 66.0*    Coag's Recent Labs  Lab 05/14/2019 1733 05/25/19 0455  APTT 27 28  INR 1.1 1.0    Sepsis Markers No results for input(s): LATICACIDVEN, PROCALCITON, O2SATVEN in the last 168 hours.  Cardiac Enzymes No results for input(s): TROPONINI, PROBNP in the last 168 hours.  REVIEW OF SYSTEMS:    Patient is intuabed and sedated

## 2019-05-26 NOTE — Procedures (Addendum)
Patient Name:Xavier Doyle [id number] Epilepsy Attending:Tritia Endo [name] Referring Physician/Provider:Whitney Rosana Hoes, NP Duration: 05/25/2019 2048 to 05/26/2019 2048  Patient history:58yo s/p cardiac arrest now on TTM. EEG to evaluate for seizure  Level of alertness:comatose  AEDs during EEG study:Propofol, Keppra  Technical aspects: This EEG study was done with scalp electrodes positioned according to the 10-20 International system of electrode placement. Electrical activity was acquired at a sampling rate of 500Hz  and reviewed with a high frequency filter of 70Hz  and a low frequency filter of 1Hz . EEG data were recorded continuously and digitally stored.  DESCRIPTION: EEG showed continuous generalized background suppression. Intermittent generalized high amplitude sharply contoured 4-6zh theta slowing was also noted.After around 9 AM on 05/26/2019 predominantly showed generalized background suppression. Hyperventilation and photic stimulation were not performed.  Of note, significant EKG infarct was seen throughout the study.   ABNORMALITY - Burst suppression, generalized    IMPRESSION: This study isshowed evidence of profound diffuse encephalopathy, non specific to etiology. No seizures ordefiniteepileptiform discharges were seen throughout the recording.  Xavier Doyle [name]

## 2019-05-26 NOTE — Progress Notes (Signed)
FPTS Social Note  Family Medicine will continue to follow Mr. Deberardinis socially while under the care of CCM and remain intubated. We look to resume the role of the primary team when he is appropriate for the floor.   Stark Klein, MD  PGY1, [place] Intern Pager [phone]

## 2019-05-26 NOTE — Progress Notes (Signed)
Progress Note  Patient Name: Xavier Doyle Date of Encounter: 05/26/2019  Primary Cardiologist: Sinclair Grooms, MD   Subjective   Still intubated. Rewarming now occurring. No significant arrhythmias and not currently on amiodarone.  Inpatient Medications    Scheduled Meds: . artificial tears  1 application Both Eyes O3Z  . aspirin  81 mg Per Tube Daily  . atorvastatin  80 mg Per Tube q1800  . chlorhexidine gluconate (MEDLINE KIT)  15 mL Mouth Rinse BID  . Chlorhexidine Gluconate Cloth  6 each Topical Daily  . heparin  5,000 Units Subcutaneous Q8H  . insulin detemir  10 Units Subcutaneous Q12H  . mouth rinse  15 mL Mouth Rinse 10 times per day  . pantoprazole (PROTONIX) IV  40 mg Intravenous Q24H   Continuous Infusions: . amiodarone Stopped (05/25/19 0706)  . cisatracurium (NIMBEX) infusion 1.5 mcg/kg/min (05/26/19 1100)  . DOPamine 5 mcg/kg/min (05/26/19 1100)  . fentaNYL infusion INTRAVENOUS 250 mcg/hr (05/26/19 1100)  . levETIRAcetam Stopped (05/26/19 0342)  . midazolam 8 mg/hr (05/26/19 1100)  . norepinephrine (LEVOPHED) Adult infusion Stopped (05/26/19 1059)  . sodium chloride     PRN Meds: acetaminophen, albuterol, [DISCONTINUED] cisatracurium **AND** cisatracurium (NIMBEX) infusion **AND** cisatracurium, fentaNYL, nitroGLYCERIN, ondansetron (ZOFRAN) IV   Vital Signs    Vitals:   05/26/19 0900 05/26/19 0915 05/26/19 1000 05/26/19 1100  BP: 105/86     Pulse: 61 64    Resp: 18 18    Temp: (!) 95.2 F (35.1 C)  (!) 95.7 F (35.4 C) (!) 96.3 F (35.7 C)  TempSrc: Bladder  Bladder Bladder  SpO2: 95% 95%    Weight:      Height:        Intake/Output Summary (Last 24 hours) at 05/26/2019 1107 Last data filed at 05/26/2019 1100 Gross per 24 hour  Intake 2633.03 ml  Output 2260 ml  Net 373.03 ml   Last 3 Weights 05/26/2019 05/25/2019 05/26/2019  Weight (lbs) 276 lb 0.6 oz 280 lb 3.6 oz 269 lb 3.2 oz  Weight (kg) 125.21 kg 127.11 kg 122.108 kg       Telemetry    Sinus bradycardia without ventricular ectopy - personally Reviewed  ECG    Sinus bradycardia 05/25/2019 at 729.  Normal PR interval-- Personally Reviewed  Physical Exam  Obese African-American male GEN:  Intubated Neck:  Not assessed Cardiac:  Regular rhythm without pericardial rub Respiratory:  Clear lung fields anterior bilateral GI:  No bowel sounds heard Neuro:   Sedated and paralyzed   Labs    High Sensitivity Troponin:   Recent Labs  Lab 05/20/2019 1110 05/08/2019 1251 05/23/19 0728 06/03/2019 1733 05/18/2019 1934  TROPONINIHS 52* 54* 58* 116* 13,071*      Chemistry Recent Labs  Lab 05/25/19 1423 05/25/19 1423 05/25/19 2019 05/25/19 2019 05/26/19 0230 05/26/19 0230 05/26/19 0430 05/26/19 0532 05/26/19 0813  NA 136   < > 138   < > 139   < > 138 139 139  K 4.5   < > 3.6   < > 3.6   < > 3.6 3.7 3.9  CL 105   < > 108   < > 108  --  108  --  107  CO2 20*  --  19*  --  19*  --   --   --   --   GLUCOSE 130*   < > 127*   < > 132*  --  133*  --  135*  BUN  25*   < > 27*   < > 24*  --  23*  --  22*  CREATININE 1.67*   < > 1.57*   < > 1.51*  --  1.50*  --  1.40*  CALCIUM 7.8*  --  7.8*  --  7.9*  --   --   --   --   PROT 6.1*  --  6.3*  --  6.3*  --   --   --   --   ALBUMIN 2.8*  --  2.8*  --  2.8*  --   --   --   --   AST 296*  --  267*  --  235*  --   --   --   --   ALT 146*  --  142*  --  130*  --   --   --   --   ALKPHOS 90  --  87  --  86  --   --   --   --   BILITOT 0.6  --  0.4  --  0.3  --   --   --   --   GFRNONAA 44*  --  48*  --  50*  --   --   --   --   GFRAA 51*  --  55*  --  58*  --   --   --   --   ANIONGAP 11  --  11  --  12  --   --   --   --    < > = values in this interval not displayed.     Hematology Recent Labs  Lab 05/31/2019 1733 05/18/2019 2028 05/25/19 0455 05/25/19 0841 05/26/19 0438 05/26/19 0532 05/26/19 0813  WBC 10.2  --  14.7*  --  10.4  --   --   RBC 5.01  --  5.67  --  5.38  --   --   HGB 11.7*   < > 13.0   < >  12.5* 13.6 13.9  HCT 40.7   < > 44.8   < > 42.2 40.0 41.0  MCV 81.2  --  79.0*  --  78.4*  --   --   MCH 23.4*  --  22.9*  --  23.2*  --   --   MCHC 28.7*  --  29.0*  --  29.6*  --   --   RDW 16.3*  --  16.3*  --  16.4*  --   --   PLT 265  --  306  --  255  --   --    < > = values in this interval not displayed.    BNPNo results for input(s): BNP, PROBNP in the last 168 hours.   DDimer  Recent Labs  Lab 05/25/2019 1850  DDIMER 0.47     Radiology    DG Chest 1 View  Result Date: 05/28/2019 CLINICAL DATA:  Central line placement OG tube placement EXAM: CHEST  1 VIEW COMPARISON:  05/10/2019 FINDINGS: Endotracheal tube tip is about 2.7 cm superior to the carina. Esophageal tube tip below the diaphragm but incompletely visualized. Left-sided central venous catheter tip partially obscured by support device, appears to be over the upper SVC. No pneumothorax. Low lung volumes. Enlarged cardiomediastinal silhouette. Increasing airspace disease at the left base. Right lung grossly clear. IMPRESSION: 1. Endotracheal tube tip about 2.7 cm superior to carina. Esophageal tube tip below the diaphragm but  incompletely visualized 2. Left IJ central venous catheter tip partially obscured, appears to overlie the SVC origin. No left pneumothorax 3. Increasing airspace disease at the left base.  Cardiomegaly. Electronically Signed   By: Donavan Foil M.D.   On: 05/26/2019 19:16   DG Abd 1 View  Result Date: 05/09/2019 CLINICAL DATA:  OG tube placement EXAM: ABDOMEN - 1 VIEW COMPARISON:  None. FINDINGS: Airspace disease at the left base. Esophageal tube tip overlies the distal stomach. IMPRESSION: Esophageal tube tip overlies the distal stomach. Electronically Signed   By: Donavan Foil M.D.   On: 06/05/2019 19:17   CARDIAC CATHETERIZATION  Result Date: 05/06/2019  Mid RCA lesion is 10% stenosed. No significant CAD.  LV end diastolic pressure is moderately elevated. LVEDP 26 mm Hg.  There is no aortic  valve stenosis.  Hemodynamic findings consistent with mild pulmonary hypertension.  Ao sat 96%, PA sat 67%, mean PA pressure 29 mm Hg; mean PCWP 20; CO 6.3 L/min, CI 2.72  Medical therapy for nonischemic cardiomyopathy.   DG Chest Port 1 View  Result Date: 05/12/2019 CLINICAL DATA:  Status post intubation. EXAM: PORTABLE CHEST 1 VIEW COMPARISON:  May 22, 2019 FINDINGS: An endotracheal tube is seen with its distal tip approximately 4.7 cm from the carina. This represents a new finding when compared to the prior study. Mildly radiopaque tags and labels are seen overlying the left lung with subsequently limited evaluation of the pulmonary parenchyma within these regions. There is no evidence of acute infiltrate, pleural effusion or pneumothorax. There is mild to moderate severity enlargement of the cardiac silhouette. The visualized skeletal structures are unremarkable. IMPRESSION: 1. Interval endotracheal tube placement and positioning, as described above, when compared to the prior chest plain film dated May 22, 2019. Electronically Signed   By: Virgina Norfolk M.D.   On: 06/03/2019 17:43   EEG adult  Result Date: 05/31/2019 Lora Havens, MD     05/26/2019  9:05 PM Patient Name: Xavier Doyle MRN: [id number] Epilepsy Attending: Lora Havens Referring Physician/Provider: Merlene Laughter, NP Date: 05/14/2019 Duration: 21.03 mins Patient history: 59yo s/p cardiac arrest now on TTM. EEG to evaluate for seizure Level of alertness: comatose AEDs during EEG study: Propofol Technical aspects: This EEG study was done with scalp electrodes positioned according to the 10-20 International system of electrode placement. Electrical activity was acquired at a sampling rate of '500Hz'$  and reviewed with a high frequency filter of '70Hz'$  and a low frequency filter of '1Hz'$ . EEG data were recorded continuously and digitally stored. DESCRIPTION: EEG showed continuous generalized background suppression. Intermittent  generalized high amplitude sharply contoured 4-6zh theta slowing was also noted. Hyperventilation and photic stimulation were not performed. ABNORMALITY - Background suppression, generalized - Intermittent slow, generalized IMPRESSION: This study is showed evidence of profound diffuse encephalopathy, non specific to etiology. No seizures or definite epileptiform discharges were seen throughout the recording. Priyanka [name]   Overnight EEG with video  Result Date: 05/25/2019 Lora Havens, MD     05/26/2019  9:09 AM Patient Name: Xavier Doyle MRN: [id number] Epilepsy Attending: Lora Havens Referring Physician/Provider: Merlene Laughter, NP Duration: 05/14/2019 2048 to 05/25/2019 2048  Patient history: 59yo s/p cardiac arrest now on TTM. EEG to evaluate for seizure  Level of alertness: comatose  AEDs during EEG study: Propofol, Keppra  Technical aspects: This EEG study was done with scalp electrodes positioned according to the 10-20 International system of electrode placement. Electrical activity was acquired at a sampling rate  of '500Hz'$  and reviewed with a high frequency filter of '70Hz'$  and a low frequency filter of '1Hz'$ . EEG data were recorded continuously and digitally stored.  DESCRIPTION: EEG showed continuous generalized background suppression. Intermittent generalized high amplitude sharply contoured 4-6zh theta slowing was also noted. Hyperventilation and photic stimulation were not performed. Event button was pressed multiple times (at 2340 and 2349 on 05/14/2019, at 1358, 1402, 1403, 1405, 1406, 1416, 1426, 1445, 1451, 1518, 1530, 1536, 1552, 1609, 1948 on 05/25/2019) for unclear reasons.  Concomitant EEG at times showed high amplitude sharply contoured 4 to 6 Hz theta slowing without clear evolution.  ABNORMALITY - Burst suppression, generalized  IMPRESSION: This study is showed evidence of profound diffuse encephalopathy, non specific to etiology. No seizures or definite epileptiform discharges  were seen throughout the recording.  Lora Havens   ECHOCARDIOGRAM COMPLETE  Result Date: 05/25/2019   ECHOCARDIOGRAM REPORT   Patient Name:   Xavier Doyle Date of Exam: 05/25/2019 Medical Rec #:  [id number]   Height:       68.0 in Accession #:    [id number]  Weight:       280.2 lb Date of Birth:  [birth year]   BSA:          2.36 m Patient Age:    21 years    BP:           129/77 mmHg Patient Gender: M           HR:           43 bpm. Exam Location:  Inpatient Procedure: 2D Echo, Cardiac Doppler and Color Doppler STAT ECHO Indications:    Cardiac arrest I46.9  History:        Patient has prior history of Echocardiogram examinations, most                 recent 05/23/2019. Arrythmias:Cardiac Arrest,                 Signs/Symptoms:Chest Pain; Risk Factors:Hypertension,                 Dyslipidemia and Former Smoker.  Sonographer:    Paulita Fujita RDCS Referring Phys: [id number] CHI JANE ELLISON IMPRESSIONS  1. Left ventricular ejection fraction, by visual estimation, is 25 to 30%. The left ventricle has severely decreased function. There is mildly increased left ventricular hypertrophy.  2. Left ventricular diastolic parameters are consistent with Grade I diastolic dysfunction (impaired relaxation).  3. The left ventricle demonstrates global hypokinesis.  4. Global right ventricle has moderately reduced systolic function.The right ventricular size is normal. No increase in right ventricular wall thickness.  5. Left atrial size was normal.  6. Right atrial size was normal.  7. The mitral valve is normal in structure. Trivial mitral valve regurgitation. No evidence of mitral stenosis.  8. The tricuspid valve is normal in structure.  9. The tricuspid valve is normal in structure. Tricuspid valve regurgitation is trivial. 10. The aortic valve is tricuspid. Aortic valve regurgitation is trivial. No evidence of aortic valve sclerosis or stenosis. 11. The pulmonic valve was normal in structure. Pulmonic valve regurgitation  is not visualized. 12. Moderately elevated pulmonary artery systolic pressure. 13. The tricuspid regurgitant velocity is 2.73 m/s, and with an assumed right atrial pressure of 15 mmHg, the estimated right ventricular systolic pressure is moderately elevated at 44.8 mmHg. 14. The inferior vena cava is dilated in size with <50% respiratory variability, suggesting right atrial pressure of 15 mmHg. 15. No significant  change from prior study. FINDINGS  Left Ventricle: Left ventricular ejection fraction, by visual estimation, is 25 to 30%. The left ventricle has severely decreased function. The left ventricle demonstrates global hypokinesis. There is mildly increased left ventricular hypertrophy. Left ventricular diastolic parameters are consistent with Grade I diastolic dysfunction (impaired relaxation). Normal left atrial pressure. Right Ventricle: The right ventricular size is normal. No increase in right ventricular wall thickness. Global RV systolic function is has moderately reduced systolic function. The tricuspid regurgitant velocity is 2.73 m/s, and with an assumed right atrial pressure of 15 mmHg, the estimated right ventricular systolic pressure is moderately elevated at 44.8 mmHg. Left Atrium: Left atrial size was normal in size. Right Atrium: Right atrial size was normal in size Pericardium: There is no evidence of pericardial effusion. Mitral Valve: The mitral valve is normal in structure. There is mild thickening of the mitral valve leaflet(s). Trivial mitral valve regurgitation. No evidence of mitral valve stenosis by observation. Tricuspid Valve: The tricuspid valve is normal in structure. Tricuspid valve regurgitation is trivial. Aortic Valve: The aortic valve is tricuspid. . There is mild thickening and mild calcification of the aortic valve. Aortic valve regurgitation is trivial. The aortic valve is structurally normal, with no evidence of sclerosis or stenosis. There is mild thickening of the aortic  valve. There is mild calcification of the aortic valve. Pulmonic Valve: The pulmonic valve was normal in structure. Pulmonic valve regurgitation is not visualized. Pulmonic regurgitation is not visualized. Aorta: The aortic root, ascending aorta and aortic arch are all structurally normal, with no evidence of dilitation or obstruction. Venous: The inferior vena cava is dilated in size with less than 50% respiratory variability, suggesting right atrial pressure of 15 mmHg. IAS/Shunts: No atrial level shunt detected by color flow Doppler. There is no evidence of a patent foramen ovale. No ventricular septal defect is seen or detected. There is no evidence of an atrial septal defect.  LEFT VENTRICLE PLAX 2D LVIDd:         5.20 cm LVIDs:         4.60 cm LV PW:         1.20 cm LV IVS:        1.20 cm LVOT diam:     2.10 cm LV SV:         32 ml LV SV Index:   12.71 LVOT Area:     3.46 cm  LV Volumes (MOD) LV area d, A2C:    47.00 cm LV area d, A4C:    59.90 cm LV area s, A2C:    36.30 cm LV area s, A4C:    49.10 cm LV major d, A2C:   9.44 cm LV major d, A4C:   10.20 cm LV major s, A2C:   8.16 cm LV major s, A4C:   9.55 cm LV vol d, MOD A2C: 195.0 ml LV vol d, MOD A4C: 290.0 ml LV vol s, MOD A2C: 136.0 ml LV vol s, MOD A4C: 212.0 ml LV SV MOD A2C:     59.0 ml LV SV MOD A4C:     290.0 ml LV SV MOD BP:      63.5 ml RIGHT VENTRICLE TAPSE (M-mode): 1.3 cm LEFT ATRIUM             Index       RIGHT ATRIUM           Index LA diam:        3.60 cm 1.53 cm/m  RA Area:     21.00 cm LA Vol (A2C):   56.7 ml 24.04 ml/m RA Volume:   59.50 ml  25.23 ml/m LA Vol (A4C):   47.6 ml 20.18 ml/m LA Biplane Vol: 54.2 ml 22.98 ml/m  AORTIC VALVE LVOT Vmax:   117.00 cm/s LVOT Vmean:  69.800 cm/s LVOT VTI:    0.167 m  AORTA Ao Root diam: 3.20 cm TRICUSPID VALVE TR Peak grad:   29.8 mmHg TR Vmax:        273.00 cm/s  SHUNTS Systemic VTI:  0.17 m Systemic Diam: 2.10 cm  Candee Furbish MD Electronically signed by Candee Furbish MD Signature  Date/Time: 05/25/2019/8:58:28 AM    Final     Cardiac Studies    Cardiac cath did not reveal significant coronary obstruction  Decreased LV systolic function with elevated filling pressures consistent with acute on chronic combined systolic and diastolic heart failure.  Patient Profile     59 y.o. male with a historyof hypertension, hyperlipidemia, asthma, arthritis, anxiety/depression,alcohol abuse, prior cocaine abuse, hypertension, debility due to osteoarthritis, and recently diagnosed nonischemic cardiomyopathy with LVEF less than 35%.  In hospital V. fib cardiac arrest precipitated by R on T.  Prolonged CPR after relatively prolonged downtime (greater than 10 minutes). . Assessment & Plan    1. Ventricular fibrillation cardiac arrest (R on T phenomenon): Currently off amiodarone because of bradycardia.  Once rewarmed, likely resumption of amiodarone.   2. Chronic combined systolic and diastolic heart failure: Guideline directed therapy will be reinstituted when/if he recovers from a neurological standpoint.  Will need diuresis when hemodynamics allow. 3. Respiratory failure, hypoxic: Being managed by critical care 4. Acute kidney injury: Improved kidney function based on this a.m. results.  Creatinine 1.4.   5. Labile hemodynamics: Low-dose dopamine for chronotropic support and inotrope E.  The patient has already undergone cardiac cath.  He does not have an ischemic component.  VF was primary and related to his underlying left ventricular disease process.  He will require defibrillator if he recovers.   For questions or updates, please contact [place] Please consult [email or web address] for contact info under        Signed, Sinclair Grooms, MD  05/26/2019, 11:07 AM

## 2019-05-26 NOTE — Progress Notes (Signed)
RT called to patient room due to patient having a decrease in sats.  Upon arrival patient was noted to have sats of 86%, with good waveform, that did not improve by increasing FIO2 to 100%.  Performed recruitment maneuver and sats improved to 100%.  ABG ordered and obtained.  Results given to MD and increased RR from 18 to 22.  Tolerating well at this time.  Will continue to monitor.      Ref. Range 05/26/2019 15:03  Sample type Unknown ARTERIAL  pH, Arterial Latest Ref Range: 7.350 - 7.450  7.210 (L)  pCO2 arterial Latest Ref Range: 32.0 - 48.0 mmHg 63.3 (H)  pO2, Arterial Latest Ref Range: 83.0 - 108.0 mmHg 141.0 (H)  TCO2 Latest Ref Range: 22 - 32 mmol/L 27  Acid-base deficit Latest Ref Range: 0.0 - 2.0 mmol/L 4.0 (H)  Bicarbonate Latest Ref Range: 20.0 - 28.0 mmol/L 25.1  O2 Saturation Latest Units: % 98.0  Patient temperature Unknown 37.6 C  Collection site Unknown ARTERIAL LINE

## 2019-05-26 NOTE — Progress Notes (Addendum)
Notified by nurse that the patient is becoming hypoxic needing to increase his fio2 to 100% and he is hypotensive with MAPs in 59-60 range. Patient was having elevated temperatures earlier and was given tylenol and bair hugger.   CVP 19, diffuse rhonchi on exam with peripheral edema.  Concern for aspiration vs worsening pulmonary edema. given patient's increased oxygen needs, worsening hemodynamic status.    Patient was already given one dose of iv lasix 40mg  and iv lasix 80mg  today 1/21. Will place on lasix gtt along with metolazone.  -started on IV lasix 4mg /hr drip  -start metolazone 5mg   -monitor I/O  -daily weight  -stat chest xray  -abg -cbc -started on empiric vancomycin and cefepime  Lars Mage, MD Internal Medicine PGY3 05/26/2019, 2:53 PM

## 2019-05-26 NOTE — Progress Notes (Signed)
Nutrition Follow up  DOCUMENTATION CODES:   Obesity unspecified  INTERVENTION:   Start tube feeding:  -Vital High Protein @ 55 ml/hr via NGT (1320 ml) -30 ml Prostat BID  Provides: 1520 kcals, 146 grams protein, 1104 ml free water.   NUTRITION DIAGNOSIS:   Increased nutrient needs related to acute illness as evidenced by estimated needs.  GOAL:   Patient will meet greater than or equal to 90% of their needs   MONITOR:   Weight trends, Diet advancement, Vent status, Skin, TF tolerance, I & O's  REASON FOR ASSESSMENT:   Ventilator    ASSESSMENT:   Patient with PMH significant for HLD, HTN, OSA, ETOH abuse, and asthma. Presents this admission with new CHF.   1/19- L/R heart cath, s/p PEA arrest, intubated   Pt discussed during ICU rounds and with RN.   Pt now rewarming. Will likely need ICD. EEG shows diffuse encephalopathy without seizures. Off Nimbex, levophed, and propofol (elevated TGs). Spoke with CCM, Okay to start feeding.   Admission weight: 124.7 kg   Current weight: 125.2 kg   Patient remains intubated on ventilator support MV: 8.8 L/min Temp (24hrs), Avg:92.9 F (33.8 C), Min:90.1 F (32.3 C), Max:97.2 F (36.2 C)   I/O: +3,485 ml since admit  UOP: 1,935 ml x 24 hrs   Drips: dopamine, fentanyl, versed Medications: levemir Labs: CBG 113-245 TGs 417 (H)   Diet Order:   Diet Order    None      EDUCATION NEEDS:   Not appropriate for education at this time  Skin:  Skin Assessment: Reviewed RN Assessment  Last BM:  1/20  Height:   Ht Readings from Last 1 Encounters:  05/14/2019 5\' 8"  (1.727 m)    Weight:   Wt Readings from Last 1 Encounters:  05/26/19 125.2 kg    Ideal Body Weight:  70 kg  BMI:  Body mass index is 41.97 kg/m.  Estimated Nutritional Needs:   Kcal:  1343- 1709 kcal  Protein:  140-175 grams  Fluid:  >/= 1.7 L/day   Mariana Single RD, LDN Clinical Nutrition Pager # - [phone]

## 2019-05-26 NOTE — Progress Notes (Addendum)
Pharmacy Antibiotic Note  Xavier Doyle is a 59 y.o. male admitted on 05/23/2019 with pneumonia.  Pharmacy has been consulted for vancomycin/cefepime dosing.  Patient just warmed to normal temp and with worsening hypoxia, hypotension, tachycardia and elevated temperature. Suspect HAP/aspiration PNA. Scr stable at 1.5.  ADDENDUM 17:30:  Scr from 14:53 lab resulted and trended up to 2.21. Vancomycin loading dose already in progress. Asked RN to stop dose after 90 min so only 2g of the 2.5g will be given. Unable to change order to reflect this given already charted on. Will adjust vancomycin maintenance and cefepime dose accordingly.   1/21 Vancomycin 1000mg  Q 24 hr Scr used: 2.21 mg/dL Weight: 125 kg Vd coeff: 0.5 L/kg Est AUC: 475  Plan: Vancomycin 2000mg  load (RN to run 90 min of 2500mg  dose) then 1000mg  Q24hr Cefepime 2g Q12 hr F/u renal fx closely, clinical status, cultures, vanco levels as appropriate F/u abx LOT, narrow as able  Height: 5\' 8"  (172.7 cm) Weight: 276 lb 0.6 oz (125.2 kg) IBW/kg (Calculated) : 68.4  Temp (24hrs), Avg:93.9 F (34.4 C), Min:90.1 F (32.3 C), Max:100 F (37.8 C)  Recent Labs  Lab 05/23/19 0728 05/23/19 0728 05/12/2019 0708 05/23/2019 0708 05/08/2019 1733 05/30/2019 2235 05/25/19 0455 05/25/19 0841 05/26/19 0230 05/26/19 0430 05/26/19 0438 05/26/19 0813 05/26/19 1016 05/26/19 1201  WBC 6.3  --  6.7  --  10.2  --  14.7*  --   --   --  10.4  --   --   --   CREATININE 0.94   < > 0.90   < > 1.35*   < > 1.82*   < > 1.51* 1.50*  --  1.40* 1.50* 1.50*   < > = values in this interval not displayed.    Estimated Creatinine Clearance: 69.2 mL/min (A) (by C-G formula based on SCr of 1.5 mg/dL (H)).    No Known Allergies  Antimicrobials this admission: Vanco  1/21 >>  Cefe 1/21 >>   Microbiology results: 1/21 BCx: pen 1/17 covid neg; flu neg   Thank you for allowing pharmacy to be a part of this patient's care.  Benetta Spar, PharmD, BCPS,  [facility] Clinical Pharmacist  Please check AMION for all [facility] phone numbers After 10:00 PM, call [facility] [phone]

## 2019-05-26 NOTE — Progress Notes (Signed)
[facility] Progress Note Patient Name: Xavier Doyle DOB: 05/30/1960 MRN: [id number]   Date of Service  05/26/2019  HPI/Events of Note  Pt with brief episode of wide complex tachycardia without hemodynamic compromise. K+ 4.0, Ca ++  1.06, no recent magnesium level.  eICU Interventions  Calcium gluconate 2 gm iv bolus, check magnesium level.        Kerry Kass Grant [place] 05/26/2019, 11:55 PM

## 2019-05-27 ENCOUNTER — Inpatient Hospital Stay (HOSPITAL_COMMUNITY): Payer: Medicare Other

## 2019-05-27 DIAGNOSIS — J9601 Acute respiratory failure with hypoxia: Secondary | ICD-10-CM

## 2019-05-27 DIAGNOSIS — J9691 Respiratory failure, unspecified with hypoxia: Secondary | ICD-10-CM

## 2019-05-27 LAB — POCT I-STAT, CHEM 8
BUN: 27 mg/dL — ABNORMAL HIGH (ref 6–20)
Calcium, Ion: 1.09 mmol/L — ABNORMAL LOW (ref 1.15–1.40)
Chloride: 107 mmol/L (ref 98–111)
Creatinine, Ser: 2.5 mg/dL — ABNORMAL HIGH (ref 0.61–1.24)
Glucose, Bld: 142 mg/dL — ABNORMAL HIGH (ref 70–99)
HCT: 41 % (ref 39.0–52.0)
Hemoglobin: 13.9 g/dL (ref 13.0–17.0)
Potassium: 4.4 mmol/L (ref 3.5–5.1)
Sodium: 138 mmol/L (ref 135–145)
TCO2: 24 mmol/L (ref 22–32)

## 2019-05-27 LAB — CBC WITH DIFFERENTIAL/PLATELET
Abs Immature Granulocytes: 0.07 10*3/uL (ref 0.00–0.07)
Basophils Absolute: 0 10*3/uL (ref 0.0–0.1)
Basophils Relative: 0 %
Eosinophils Absolute: 0 10*3/uL (ref 0.0–0.5)
Eosinophils Relative: 0 %
HCT: 42.8 % (ref 39.0–52.0)
Hemoglobin: 12.3 g/dL — ABNORMAL LOW (ref 13.0–17.0)
Immature Granulocytes: 1 %
Lymphocytes Relative: 10 %
Lymphs Abs: 1.2 10*3/uL (ref 0.7–4.0)
MCH: 23.1 pg — ABNORMAL LOW (ref 26.0–34.0)
MCHC: 28.7 g/dL — ABNORMAL LOW (ref 30.0–36.0)
MCV: 80.3 fL (ref 80.0–100.0)
Monocytes Absolute: 0.6 10*3/uL (ref 0.1–1.0)
Monocytes Relative: 5 %
Neutro Abs: 10.5 10*3/uL — ABNORMAL HIGH (ref 1.7–7.7)
Neutrophils Relative %: 84 %
Platelets: 252 10*3/uL (ref 150–400)
RBC: 5.33 MIL/uL (ref 4.22–5.81)
RDW: 17 % — ABNORMAL HIGH (ref 11.5–15.5)
WBC: 12.4 10*3/uL — ABNORMAL HIGH (ref 4.0–10.5)
nRBC: 0 % (ref 0.0–0.2)

## 2019-05-27 LAB — GLUCOSE, CAPILLARY
Glucose-Capillary: 115 mg/dL — ABNORMAL HIGH (ref 70–99)
Glucose-Capillary: 119 mg/dL — ABNORMAL HIGH (ref 70–99)
Glucose-Capillary: 125 mg/dL — ABNORMAL HIGH (ref 70–99)
Glucose-Capillary: 133 mg/dL — ABNORMAL HIGH (ref 70–99)
Glucose-Capillary: 149 mg/dL — ABNORMAL HIGH (ref 70–99)
Glucose-Capillary: 98 mg/dL (ref 70–99)

## 2019-05-27 LAB — URINALYSIS, ROUTINE W REFLEX MICROSCOPIC
Bilirubin Urine: NEGATIVE
Glucose, UA: NEGATIVE mg/dL
Ketones, ur: 5 mg/dL — AB
Leukocytes,Ua: NEGATIVE
Nitrite: NEGATIVE
Protein, ur: 100 mg/dL — AB
Specific Gravity, Urine: 1.028 (ref 1.005–1.030)
pH: 5 (ref 5.0–8.0)

## 2019-05-27 LAB — BASIC METABOLIC PANEL
Anion gap: 10 (ref 5–15)
Anion gap: 10 (ref 5–15)
BUN: 30 mg/dL — ABNORMAL HIGH (ref 6–20)
BUN: 40 mg/dL — ABNORMAL HIGH (ref 6–20)
CO2: 22 mmol/L (ref 22–32)
CO2: 20 mmol/L — ABNORMAL LOW (ref 22–32)
Calcium: 8 mg/dL — ABNORMAL LOW (ref 8.9–10.3)
Calcium: 7.5 mg/dL — ABNORMAL LOW (ref 8.9–10.3)
Chloride: 106 mmol/L (ref 98–111)
Chloride: 106 mmol/L (ref 98–111)
Creatinine, Ser: 2.71 mg/dL — ABNORMAL HIGH (ref 0.61–1.24)
Creatinine, Ser: 3.39 mg/dL — ABNORMAL HIGH (ref 0.61–1.24)
GFR calc Af Amer: 29 mL/min — ABNORMAL LOW (ref >60)
GFR calc Af Amer: 22 mL/min — ABNORMAL LOW (ref >60)
GFR calc non Af Amer: 25 mL/min — ABNORMAL LOW (ref >60)
GFR calc non Af Amer: 19 mL/min — ABNORMAL LOW (ref >60)
Glucose, Bld: 119 mg/dL — ABNORMAL HIGH (ref 70–99)
Glucose, Bld: 145 mg/dL — ABNORMAL HIGH (ref 70–99)
Potassium: 4.4 mmol/L (ref 3.5–5.1)
Potassium: 4.7 mmol/L (ref 3.5–5.1)
Sodium: 138 mmol/L (ref 135–145)
Sodium: 136 mmol/L (ref 135–145)

## 2019-05-27 LAB — POCT I-STAT 7, (LYTES, BLD GAS, ICA,H+H)
Acid-base deficit: 4 mmol/L — ABNORMAL HIGH (ref 0.0–2.0)
Bicarbonate: 25.4 mmol/L (ref 20.0–28.0)
Calcium, Ion: 1.14 mmol/L — ABNORMAL LOW (ref 1.15–1.40)
HCT: 41 % (ref 39.0–52.0)
Hemoglobin: 13.9 g/dL (ref 13.0–17.0)
O2 Saturation: 92 %
Patient temperature: 37.4
Potassium: 4.5 mmol/L (ref 3.5–5.1)
Sodium: 141 mmol/L (ref 135–145)
TCO2: 27 mmol/L (ref 22–32)
pCO2 arterial: 65.4 mmHg (ref 32.0–48.0)
pH, Arterial: 7.2 — ABNORMAL LOW (ref 7.350–7.450)
pO2, Arterial: 82 mmHg — ABNORMAL LOW (ref 83.0–108.0)

## 2019-05-27 LAB — PHOSPHORUS: Phosphorus: 7.2 mg/dL — ABNORMAL HIGH (ref 2.5–4.6)

## 2019-05-27 LAB — MAGNESIUM
Magnesium: 1.9 mg/dL (ref 1.7–2.4)
Magnesium: 1.7 mg/dL (ref 1.7–2.4)

## 2019-05-27 LAB — EXPECTORATED SPUTUM ASSESSMENT W GRAM STAIN, RFLX TO RESP C

## 2019-05-27 LAB — BRAIN NATRIURETIC PEPTIDE: B Natriuretic Peptide: 584.4 pg/mL — ABNORMAL HIGH (ref 0.0–100.0)

## 2019-05-27 MED ORDER — VITAL HIGH PROTEIN PO LIQD: 1000.0000 mL | ORAL | Status: DC

## 2019-05-27 MED ORDER — AMIODARONE HCL IN DEXTROSE 360-4.14 MG/200ML-% IV SOLN
60.0000 mg/h | INTRAVENOUS | Status: AC
  Administered 2019-05-27: 60 mg/h via INTRAVENOUS
  Filled 2019-05-27: qty 200

## 2019-05-27 MED ORDER — IPRATROPIUM-ALBUTEROL 0.5-2.5 (3) MG/3ML IN SOLN
3.0000 mL | RESPIRATORY_TRACT | Status: DC | PRN
  Administered 2019-05-28: 3 mL via RESPIRATORY_TRACT
  Filled 2019-05-27 (×2): qty 3

## 2019-05-27 MED ORDER — DEXTROSE IN LACTATED RINGERS 5 % IV SOLN: INTRAVENOUS | Status: DC

## 2019-05-27 MED ORDER — CLONAZEPAM 0.5 MG PO TABS
0.2500 mg | ORAL_TABLET | Freq: Two times a day (BID) | ORAL | Status: DC
  Administered 2019-05-27: 0.25 mg
  Filled 2019-05-27 (×2): qty 1

## 2019-05-27 MED ORDER — AMIODARONE LOAD VIA INFUSION
150.0000 mg | Freq: Once | INTRAVENOUS | Status: AC
  Administered 2019-05-27: 150 mg via INTRAVENOUS
  Filled 2019-05-27: qty 83.34

## 2019-05-27 MED ORDER — VANCOMYCIN VARIABLE DOSE PER UNSTABLE RENAL FUNCTION (PHARMACIST DOSING): Status: DC

## 2019-05-27 MED ORDER — CISATRACURIUM BOLUS VIA INFUSION: 0.1000 mg/kg | Freq: Once | INTRAVENOUS | Status: DC

## 2019-05-27 MED ORDER — CISATRACURIUM BOLUS VIA INFUSION
0.1000 mg/kg | Freq: Once | INTRAVENOUS | Status: DC
  Filled 2019-05-27: qty 13

## 2019-05-27 MED ORDER — OXYCODONE HCL 5 MG/5ML PO SOLN
5.0000 mg | Freq: Four times a day (QID) | ORAL | Status: DC
  Administered 2019-05-27 – 2019-06-12 (×62): 5 mg
  Filled 2019-05-27 (×62): qty 5

## 2019-05-27 MED ORDER — PRO-STAT SUGAR FREE PO LIQD
60.0000 mL | Freq: Two times a day (BID) | ORAL | Status: DC
  Administered 2019-05-27 – 2019-06-12 (×32): 60 mL
  Filled 2019-05-27 (×30): qty 60

## 2019-05-27 MED ORDER — CISATRACURIUM BESYLATE 20 MG/10ML IV SOLN
0.1000 mg/kg | Freq: Once | INTRAVENOUS | Status: AC
  Administered 2019-05-27: 12.6 mg via INTRAVENOUS
  Filled 2019-05-27: qty 10

## 2019-05-27 MED ORDER — AMIODARONE HCL IN DEXTROSE 360-4.14 MG/200ML-% IV SOLN
30.0000 mg/h | INTRAVENOUS | Status: DC
  Administered 2019-05-27 – 2019-06-03 (×14): 30 mg/h via INTRAVENOUS
  Filled 2019-05-27 (×14): qty 200

## 2019-05-27 MED ORDER — VITAL AF 1.2 CAL PO LIQD
1000.0000 mL | ORAL | Status: DC
  Administered 2019-05-27 – 2019-05-31 (×4): 1000 mL

## 2019-05-27 MED ORDER — QUETIAPINE FUMARATE 25 MG PO TABS
25.0000 mg | ORAL_TABLET | Freq: Every day | ORAL | Status: DC
  Administered 2019-05-27 – 2019-06-12 (×17): 25 mg
  Filled 2019-05-27 (×17): qty 1

## 2019-05-27 MED ORDER — CLONAZEPAM 0.25 MG PO TBDP
0.2500 mg | ORAL_TABLET | Freq: Two times a day (BID) | ORAL | Status: DC
  Administered 2019-05-27 – 2019-06-03 (×15): 0.25 mg
  Filled 2019-05-27 (×15): qty 1

## 2019-05-27 MED ORDER — METOLAZONE 5 MG PO TABS
5.0000 mg | ORAL_TABLET | Freq: Once | ORAL | Status: AC
  Administered 2019-05-27: 5 mg via ORAL
  Filled 2019-05-27: qty 1

## 2019-05-27 MED ORDER — METOLAZONE 5 MG PO TABS
10.0000 mg | ORAL_TABLET | Freq: Two times a day (BID) | ORAL | Status: DC
  Administered 2019-05-27 – 2019-05-31 (×8): 10 mg via ORAL
  Filled 2019-05-27 (×8): qty 2

## 2019-05-27 MED ORDER — VECURONIUM BROMIDE 10 MG IV SOLR
0.1000 mg/kg | INTRAVENOUS | Status: DC | PRN
  Administered 2019-05-28 – 2019-05-29 (×2): 12.6 mg via INTRAVENOUS
  Filled 2019-05-27 (×2): qty 20

## 2019-05-27 MED ORDER — PRO-STAT SUGAR FREE PO LIQD: 30.0000 mL | Freq: Two times a day (BID) | ORAL | Status: DC

## 2019-05-27 NOTE — Progress Notes (Signed)
FPTS Social Note  We have been monitoring Mr. Crisman socially. FPTS will resume his care in the event that his condition improves and he becomes appropriate for the floor.   Stark Klein, MD  PGY-1, [facility] Intern Pager [phone]

## 2019-05-27 NOTE — Progress Notes (Signed)
[facility] Progress Note Patient Name: Xavier Doyle DOB: 01-16-1961 MRN: [id number]   Date of Service  05/27/2019  HPI/Events of Note  PH 7.2, PCO2 65, RR 22  eICU Interventions  RR increased from 22 to 28, ABG at 7:00 a.m.        [name] 05/27/2019, 6:07 AM

## 2019-05-27 NOTE — Progress Notes (Signed)
eLink Physician-Brief Progress Note Patient Name: Xavier Doyle DOB: [birth year] MRN: [id number]   Date of Service  05/27/2019  HPI/Events of Note  Ventricular dyssynchrony  eICU Interventions  Nimbex 0.1 mg/kg iv x 1        [name] [name] 05/27/2019, 12:20 AM

## 2019-05-27 NOTE — Progress Notes (Signed)
Called Ms. Bostick (patient's aunt) and updated her regading Xavier Doyle's care. All her questions were answered. She requested to pick up the patient's cellphone so that she can call the patient's estranged son. Patient's RN to coordinate with family regarding this.   Lars Mage, MD Internal Medicine PGY3 05/27/2019, 11:46 AM

## 2019-05-27 NOTE — Progress Notes (Addendum)
LTM EEG discontinued -  skin breakdown at unhook fp1.

## 2019-05-27 NOTE — Progress Notes (Signed)
Patient continuing to not put out significant amount of urine. Per Dr. Lynetta Mare increased metolazone to 10mg  bid and iv lasix from 4 to 10mg /hr. BMP order placed for 1800.   Lars Mage, MD Internal Medicine PGY3 05/27/2019, 4:06 PM

## 2019-05-27 NOTE — Progress Notes (Addendum)
LTM maint complete - no skin breakdown under:,FP2,F8. Possible Skin breakdown at [facility]

## 2019-05-27 NOTE — Progress Notes (Addendum)
Pharmacy Antibiotic Note  Xavier Doyle is a 59 y.o. male admitted on 05/31/2019 with pneumonia.  Pharmacy has been consulted for vancomycin/cefepime dosing.  Patient is S/P v fib arrest; he was warmed to normal temp and with worsening hypoxia, hypotension, tachycardia and elevated temperature; suspect HAP/aspiration pneumonia.  Pt is currently receiving vancomycin 1 gram IV Q 24 hrs after receiving a vancomycin 2500 mg IV loading dose. Pt rec'd his vancomycin 1 gram IV dose at ~1800 this evening.  Pt's renal function is continuing to worsen; most recent Scr is up to 3.39 (CrCl 30.7 ml/min); urine output also decreasing (on metolazone and Lasix infusion)  WBC 12.4, afebrile  Plan: Discontinue vancomycin standing order, due to worsening and unstable renal function; change to dosing vancomycin by levels until renal function stabilizes Check random vancomycin level, Scr at 1800 on 1/23 to help determine subsequent dosing strategy Continue cefepime 2 gm IV Q 12 hr; if renal function worsens, will need to change dosing schedule Monitor WBC, temp, clinical improvement, cultures, renal function, vancomycin levels  Height: 5\' 8"  (172.7 cm) Weight: 276 lb 14.7 oz (125.6 kg) IBW/kg (Calculated) : 68.4  Temp (24hrs), Avg:97.9 F (36.6 C), Min:96.8 F (36 C), Max:99.7 F (37.6 C)  Recent Labs  Lab 05/16/2019 1733 05/07/2019 2235 05/25/19 0455 05/25/19 0841 05/26/19 0438 05/26/19 0813 05/26/19 1201 05/26/19 1453 05/26/19 2256 05/27/19 0521 05/27/19 1856  WBC 10.2  --  14.7*  --  10.4  --   --  14.8*  --  12.4*  --   CREATININE 1.35*   < > 1.82*   < >  --    < > 1.50* 2.21* 2.50* 2.71* 3.39*   < > = values in this interval not displayed.    Estimated Creatinine Clearance: 30.7 mL/min (A) (by C-G formula based on SCr of 3.39 mg/dL (H)).    No Known Allergies  Antimicrobials this admission: Vanco  1/21 >>  Cefe 1/21 >>   Microbiology results: 1/21 BCx X 2: NGTD 1/17 COVID, influenza A,  influenza B, HIV: all negative 1/22 Sputum cx: Gram stain: mod WBC (PMNs), rare GPC; cx is pending  Thank you for allowing pharmacy to be a part of this patient's care.  Gillermina Hu, PharmD, BCPS, [facility] Clinical Pharmacist 1/22.21, 22:06 PM

## 2019-05-27 NOTE — Procedures (Signed)
Cortrak  Person Inserting Tube:  Rosezetta Schlatter, RD Tube Type:  Cortrak - 43 inches Tube Location:  Right nare Initial Placement:  Postpyloric Secured by: Bridle Technique Used to Measure Tube Placement:  Documented cm marking at nare/ corner of mouth Cortrak Secured At:  94 cm Procedure Comments:  Cortrak Tube Team Note:  Consult received to place a Cortrak feeding tube.   X-ray is required, abdominal x-ray has been ordered by the Cortrak team. Please confirm tube placement before using the Cortrak tube.   If the tube becomes dislodged please keep the tube and contact the Cortrak team at [email or web address] ([email or web address]) for replacement.  If after hours and replacement cannot be delayed, place a NG tube and confirm placement with an abdominal x-ray.     Jarome Matin, MS, RD, LDN, [facility] Inpatient Clinical Dietitian Pager # [phone] After hours/weekend pager # [phone]

## 2019-05-27 NOTE — Procedures (Addendum)
Patient Name:Kawhi Nyoka Cowden [id number] Epilepsy Attending:Rudolf Blizard [name] Referring [place], NP Duration:05/26/2019 2048 to 05/27/2019 0930  Patient history:58yo s/p cardiac arrest now on TTM. EEG to evaluate for seizure  Level of alertness:comatose  AEDs during EEG study:Propofol,Keppra  Technical aspects: This EEG study was done with scalp electrodes positioned according to the 10-20 International system of electrode placement. Electrical activity was acquired at a sampling rate of 500Hz  and reviewed with a high frequency filter of 70Hz  and a low frequency filter of 1Hz . EEG data were recorded continuously and digitally stored.  DESCRIPTION: EEG showed continuous generalized background suppression. EEG was not reactive to tactile stimulation. Hyperventilation and photic stimulation were not performed.  ABNORMALITY -Burstsuppression, generalized    IMPRESSION: This study isshowed evidence of profound diffuse encephalopathy, non specific to etiology. No seizures ordefiniteepileptiform discharges were seen throughout the recording.  Gearl Kimbrough [name]

## 2019-05-27 NOTE — Progress Notes (Signed)
[place] Progress Note Patient Name: Tabitha Ballengee DOB: Sep 22, 1960 MRN: [id number]   Date of Service  05/27/2019  HPI/Events of Note  Recurrence of ventricular dyssynchrony, material resembling gastric contents (enteral feed) being suctioned from patient's mouth.  eICU Interventions  Nimbex 12.5 mg iv bolus x 1, enteral nutrition held and orogastric tube to be hooked up to low intermittent suction, stat KUB to verify OG tube position, Cortrak ordered for the a.m. D 5 LR infusion @ 75 ml/ hour while Pt is NPO.        Frederik Pear 05/27/2019, 4:33 AM

## 2019-05-27 NOTE — Progress Notes (Signed)
ABG shown to RN  Results for JEWLIAN, LECONTE (MRN [id number]) as of 05/27/2019 00:04  Ref. Range 05/26/2019 23:27  Sample type Unknown ARTERIAL  pH, Arterial Latest Ref Range: 7.350 - 7.450  7.231 (L)  pCO2 arterial Latest Ref Range: 32.0 - 48.0 mmHg 52.7 (H)  pO2, Arterial Latest Ref Range: 83.0 - 108.0 mmHg 69.0 (L)  TCO2 Latest Ref Range: 22 - 32 mmol/L 24  Acid-base deficit Latest Ref Range: 0.0 - 2.0 mmol/L 6.0 (H)  Bicarbonate Latest Ref Range: 20.0 - 28.0 mmol/L 22.1  O2 Saturation Latest Units: % 89.0  Patient temperature Unknown 37.1 C  Collection site Unknown ARTERIAL LINE

## 2019-05-27 NOTE — Progress Notes (Signed)
[place] Progress Note Patient Name: Xavier Doyle DOB: 05-Oct-1960 MRN: [id number]   Date of Service  05/27/2019  HPI/Events of Note  Multiple issues: 1. Ventilator asynchrony - Patient is already well sedated on Fentanyl and Versed IV infusions and 2. CVP = 24. K+ = 4.7.  eICU Interventions  Will order: 1. Vecuronium 12.6 mg IV Q 2 hours PRN ventilator asynchrony. 2. D/C LR IV fluid.     Intervention Category Major Interventions: Respiratory failure - evaluation and management  Tsering Leaman Cornelia Copa 05/27/2019, 10:19 PM

## 2019-05-27 NOTE — Progress Notes (Signed)
Nutrition Follow up  DOCUMENTATION CODES:   Obesity unspecified  INTERVENTION:   Start tube feeding:  -Vital AF @ 20 ml/hr via Cortrak -Increase by 10 ml Q6 hours to goal rate 45 ml/hr (1080 ml) -60 ml Prostat BID  Provides: 1696 kcals, 141 grams protein, 876 ml free water.   NUTRITION DIAGNOSIS:   Increased nutrient needs related to acute illness as evidenced by estimated needs.  Ongoing  GOAL:   Patient will meet greater than or equal to 90% of their needs   Addressed via TF  MONITOR:   Weight trends, Diet advancement, Vent status, Skin, TF tolerance, I & O's  REASON FOR ASSESSMENT:   Ventilator    ASSESSMENT:   Patient with PMH significant for HLD, HTN, OSA, ETOH abuse, and asthma. Presents this admission with new CHF.   1/19- L/R heart cath, s/p PEA arrest, intubated   Pt discussed during ICU rounds and with RN.   Rewarmed. Continues on pressors. Having increased difficulty with oxygenation/ventilation. Per RN, pt had dark bilious output from nose and mouth last night. NGT hooked to suction (350 ml drained). Given color of output it's hard to determine tube feeding intolerance. Post-pyloric tube order by CCM. Once xray confirms placement will resume feedings and titrate to goal.   Admission weight: 124.7 kg   Current weight: 125.6 kg   Patient remains intubated on ventilator support MV: 11.9 L/min Temp (24hrs), Avg:98.7 F (37.1 C), Min:97.3 F (36.3 C), Max:100 F (37.8 C)   I/O: +3,839 ml since admit  UOP: 3,625 ml x 24 hrs  NGT: 350 ml x 24 hrs   Drips: D5 @ 75 ml/hr, fentanyl, 250 mg lasix in D5 @ 4 ml/hr, versed, levophed Medications: levemir Labs: Cr 2.71-trending up CBG 98-133  Diet Order:   Diet Order    None      EDUCATION NEEDS:   Not appropriate for education at this time  Skin:  Skin Assessment: Reviewed RN Assessment  Last BM:  1/20  Height:   Ht Readings from Last 1 Encounters:  05/21/2019 5\' 8"  (1.727 m)    Weight:    Wt Readings from Last 1 Encounters:  05/27/19 125.6 kg    Ideal Body Weight:  70 kg  BMI:  Body mass index is 42.11 kg/m.  Estimated Nutritional Needs:   Kcal:  1343- 1709 kcal  Protein:  140-175 grams  Fluid:  >/= 1.7 L/day   Mariana Single RD, LDN Clinical Nutrition Pager # - [phone]

## 2019-05-27 NOTE — Progress Notes (Addendum)
PULMONARY / CRITICAL CARE MEDICINE   NAME:  Xavier Doyle, MRN:  [id number], DOB:  [birth year], LOS: 3 ADMISSION DATE:  05/25/2019, CONSULTATION DATE: 05/09/2019 REFERRING MD: Family medicine teaching service, CHIEF COMPLAINT: Cardiac arrest  BRIEF HISTORY:    Xavier Doyle is a 59 year old male with hypertension, hyperlipidemia, asthma, depression who was admitted on 05/28/2019 for chest pain.  Patient was found to have newly diagnosed systolic heart failure with EF of 25-30% with global hypokinesis.  Left and right heart catheterization did not show any ischemic cardiac disease.  Patient's chest pain was thought to be due to hypertensive endorgan damage.  During hospitalization patient had a cardiac arrest.  He was found to be in torsades which converted into PEA.  Patient received epi x3, amiodarone, bicarb x2, defibrillation.  ROSC was obtained and patient was started on TTM along with dopamine.   SIGNIFICANT PAST MEDICAL HISTORY    Allergy, Anxiety, Arthritis, Asthma, Depression, Heart murmur, Hyperlipidemia, Hypertension, and Osteoporosis.  SIGNIFICANT EVENTS:   1/17 admitted to [facility] for chest pain 1/19 V. fib arrest subsequent to torsades, TTM initiated 1/20 Repeat TTE without new changes   STUDIES:    TTE 05/23/2019 Left ventricular ejection fraction, by visual estimation, is 25 to 30%. The left ventricle has severely decreased function. There is moderately increased left ventricular hypertrophy. 2. Left ventricular diastolic parameters are consistent with Grade I diastolic dysfunction (impaired relaxation). 3. The left ventricle demonstrates global hypokinesis. 4. Global right ventricle has mildly reduced systolic function.The right ventricular size is normal. No increase in right ventricular wall thickness. 5. Left atrial size was mildly dilated. 6. Right atrial size was mildly dilated. 7. The mitral valve is normal in structure. Mild mitral valve regurgitation. No evidence of mitral  stenosis. 8. The tricuspid valve is normal in structure. Tricuspid valve regurgitation is not demonstrated. 9. The aortic valve is tricuspid. Aortic valve regurgitation is mild. Mild aortic valve sclerosis without stenosis. 10. The inferior vena cava is normal in size with greater than 50% respiratory variability, suggesting right atrial pressure of 3 mmHg. 11. TR signal is inadequate for assessing pulmonary artery systolic pressure.  TTE 05/24/2018  Left ventricular ejection fraction, by visual estimation, is 25 to 30%. The left ventricle has  severely decreased function. There is mildly increased left ventricular hypertrophy. 2. Left ventricular diastolic parameters are consistent with Grade I diastolic dysfunction (impaired relaxation). 3. The left ventricle demonstrates global hypokinesis. 4. Global right ventricle has moderately reduced systolic function.The right ventricular size is normal. No increase in right ventricular wall thickness. 5. Left atrial size was normal. 6. Right atrial size was normal. 7. The mitral valve is normal in structure. Trivial mitral valve regurgitation. No evidence of mitral stenosis. 8. The tricuspid valve is normal in structure. 9. The tricuspid valve is normal in structure. Tricuspid valve regurgitation is trivial. 10. The aortic valve is tricuspid. Aortic valve regurgitation is trivial. No evidence of aortic valve sclerosis or stenosis. 11. The pulmonic valve was normal in structure. Pulmonic valve regurgitation is not visualized. 12. Moderately elevated pulmonary artery systolic pressure. 13. The tricuspid regurgitant velocity is 2.73 m/s, and with an assumed right atrial pressure of 15 mmHg, the estimated right ventricular systolic pressure is moderately elevated at 44.8 mmHg. 14. The inferior vena cava is dilated in size with <50% respiratory variability, suggesting right atrial pressure of 15 mmHg. 15. No significant change from prior  study.  Right/left heart cath 05/23/2019  Mid RCA lesion is 10% stenosed. No significant  CAD.  LV end diastolic pressure is moderately elevated. LVEDP 26 mm Hg.  There is no aortic valve stenosis.  Hemodynamic findings consistent with mild pulmonary hypertension.  Ao sat 96%, PA sat 67%, mean PA pressure 29 mm Hg; mean PCWP 20; CO 6.3 L/min, CI 2.72   Medical therapy for nonischemic cardiomyopathy.  CULTURES:   Covid negative Blood culture 1/21 no growth< 24hrs in 2 bottles Respiratory culture 1/22 pending  ANTIBIOTICS:  Vancomycin 1/21> Cefepime 1/21>  LINES/TUBES:  ETT 1/19 L CVC 1/19   CONSULTANTS:  PCCM Cardiology  SUBJECTIVE:  Patient is sedated and intubated.   CONSTITUTIONAL: BP 140/81   Pulse (!) 107   Temp 98.1 F (36.7 C) (Bladder)   Resp (!) 25   Ht 5\' 8"  (1.727 m)   Wt 125.6 kg   SpO2 92%   BMI 42.11 kg/m   I/O last 3 completed shifts: In: 4030.1 [I.V.:3098.2; NG/GT:35; IV Piggyback:896.9] Out: [id number] [Urine:4540; Emesis/NG output:650]  CVP:  [8 mmHg-19 mmHg] 8 mmHg  Vent Mode: PRVC FiO2 (%):  [40 %-100 %] 100 % Set Rate:  [18 bmp-22 bmp] 22 bmp Vt Set:  [540 mL] 540 mL PEEP:  [10 cmH20] 10 cmH20 Plateau Pressure:  [28 [id number] cmH20] 32 cmH20  PHYSIAM: General:  Sedated and intubated Neuro:  Perrla, no corneal reflex HEENT:  Et tube in place Cardiovascular:  Rrr, s1 and s2 audible Lungs: diffuse rhonchi, ttm pads placed on chest  Abdomen: protuberant abdomen  Musculoskeletal:  Peripheral edema Skin: extremities warm   ASSESSMENT AND PLAN    Cardiogenic shock secondary to V. fib arrest on 06/04/2019 Patient continues to be on levophed and dopamine. Overnight the patient was reported to have ventricular dyssynchrony and material resembling gastric content suctioned from patient's mouth. Nimbex bolus was given, enteral nutrition held, orogastric tube hooked to low intermittent suction.  -Continue TTM protocol - nimbex, eeg,  paralytic -continue versed -amiodarone discontinued due to low blood pressures, can restart if heart rates improve -titrate levophed  -discontinued dopapime  -gave oral sedation with oxycodone, seroquel, klonopin -Maintain MAP>65 -Cardiology following appreciate recommendations -continue atorvastatin 80mg  qd  -cortrak ordered and d5LR given which patient is npo  HFrEF TTE showing EF of 25-30% with g1dd. Patient was started on IV lasix drip with metolazone subsequent to bolus dose and he put out 3.6L.   CVP ranging 12-15. Creatinine has bumped up from 2.5 to 2.7. No contraction alkalosis, co2 22. POCUS attempted but unable to visualize IVC.   -continue iv lasix drip  -continue metolazone 4mg  qd -Will likely need ICD for optimal managment  Acute hypoxemic respiratory failure Thought to be secondary to cardiac arrest and pulmonary edema. Overnight ph 7.2, pco2 65, rr 22. Elink put order for rr on vent to be increased from 22 to 28. Had breath statcking last night that would increase hr to 110-120s.  -ABG pending -continue IV lasix 4mg /hr  -Elevate head of bed -VAP protocol  Acute encephalopathy  Thought to be secondary to hypoxia as result of cardiac arrest vs infectious etiology  -continuous EEG while on TTM, according to epileptologist read 1/22 profound diffuse encephalopathy no seizures or epileptiform discharges -Continue ventilatory support -keppra for seizure precaution -Continue to do frequent neurological checks -blood culture pending  -urine culture pending  -ua pending  -continue vancomycin 1/21> -continue cefepime 1/21>  Transaminitis  AST 235, ALT 130. Downtrending. Likely in setting of cardiogenic shock.  -Will follow up with cmp tomorrow 1/23  Diabetes mellitus  A1C  6.4. Glucose ranging 90-140s   -continue levemir 10u bid  AKI Cardiorenal syndrome.  Patient's creatinine continues to increase to 2.71. Likely cardiorenal syndrome.   Best Practice /  Goals of Care / Disposition.   DVT PROPHYLAXIS: heparin sq SUP: protonix NUTRITION: npo MOBILITY: bedrest GOALS OF CARE: full FAMILY DISCUSSIONS: Spoke to patient's aunt DISPOSITION ICU  LABS  Glucose Recent Labs  Lab 05/26/19 0810 05/26/19 1158 05/26/19 1630 05/26/19 1958 05/26/19 2346 05/27/19 0342  GLUCAP 130* 119* 108* 103* 104* 98    BMET Recent Labs  Lab 05/26/19 0230 05/26/19 0430 05/26/19 1453 05/26/19 1503 05/26/19 2256 05/26/19 2256 05/26/19 2327 05/27/19 0521 05/27/19 0526  NA 139   < > 136   < > 138   < > 140 138 141  K 3.6   < > 4.4   < > 4.4   < > 4.0 4.4 4.5  CL 108   < > 103  --  107  --   --  106  --   CO2 19*  --  22  --   --   --   --  22  --   BUN 24*   < > 23*  --  27*  --   --  30*  --   CREATININE 1.51*   < > 2.21*  --  2.50*  --   --  2.71*  --   GLUCOSE 132*   < > 116*  --  142*  --   --  119*  --    < > = values in this interval not displayed.    Liver Enzymes Recent Labs  Lab 05/25/19 1423 05/25/19 2019 05/26/19 0230  AST 296* 267* 235*  ALT 146* 142* 130*  ALKPHOS 90 87 86  BILITOT 0.6 0.4 0.3  ALBUMIN 2.8* 2.8* 2.8*    Electrolytes Recent Labs  Lab 05/30/2019 1934 05/09/2019 2339 05/25/19 0455 05/25/19 0843 05/25/19 2019 05/25/19 2019 05/26/19 0230 05/26/19 0438 05/26/19 1453 05/26/19 2329 05/27/19 0521  CALCIUM  --   --  8.1*   < > 7.8*   < > 7.9*  --  8.0*  --  8.0*  MG  --    < > 1.8   < > 2.1  --   --  2.0  --  1.9  --   PHOS 5.5*  --  3.7  --   --   --   --  3.9  --   --   --    < > = values in this interval not displayed.    CBC Recent Labs  Lab 05/26/19 0438 05/26/19 0532 05/26/19 1453 05/26/19 1503 05/26/19 2327 05/27/19 0521 05/27/19 0526  WBC 10.4  --  14.8*  --   --  12.4*  --   HGB 12.5*   < > 12.9*   < > 13.3 12.3* 13.9  HCT 42.2   < > 43.8   < > 39.0 42.8 41.0  PLT 255  --  258  --   --  252  --    < > = values in this interval not displayed.    ABG Recent Labs  Lab 05/26/19 1503  05/26/19 2327 05/27/19 0526  PHART 7.210* 7.231* 7.200*  PCO2ART 63.3* 52.7* 65.4*  PO2ART 141.0* 69.0* 82.0*    Coag's Recent Labs  Lab 05/15/2019 1733 05/25/19 0455  APTT 27 28  INR 1.1 1.0    Sepsis Markers No results for input(s): LATICACIDVEN, PROCALCITON,  O2SATVEN in the last 168 hours.  Cardiac Enzymes No results for input(s): TROPONINI, PROBNP in the last 168 hours.  REVIEW OF SYSTEMS:    Patient is intuabed and sedated

## 2019-05-27 NOTE — Progress Notes (Signed)
Last night pt had recurring episodes of stacking his breathing with the vent. This would would cause his heart rate to increase to the 110-120 range (resting range being high 80s to high 90s) while also dropping is spo2 to the hgih 80s to low 90s.  Nurse notified CMM about these episodes. Please refer to CCM notes for further detail.

## 2019-05-27 NOTE — Progress Notes (Signed)
Progress Note  Patient Name: Xavier Doyle Date of Encounter: 05/27/2019  Primary Cardiologist: Belva Crome III, MD   Subjective   Intubated, rewarmed with no spontaneous movement, but at times having reflex dyssynchrony with the ventilator.  Inpatient Medications    Scheduled Meds: . artificial tears  1 application Both Eyes V7B  . aspirin  81 mg Per Tube Daily  . atorvastatin  80 mg Per Tube q1800  . chlorhexidine gluconate (MEDLINE KIT)  15 mL Mouth Rinse BID  . Chlorhexidine Gluconate Cloth  6 each Topical Daily  . feeding supplement (PRO-STAT SUGAR FREE 64)  30 mL Per Tube BID  . feeding supplement (VITAL HIGH PROTEIN)  1,000 mL Per Tube Q24H  . heparin  5,000 Units Subcutaneous Q8H  . insulin detemir  10 Units Subcutaneous Q12H  . mouth rinse  15 mL Mouth Rinse 10 times per day  . pantoprazole (PROTONIX) IV  40 mg Intravenous Q24H   Continuous Infusions: . ceFEPime (MAXIPIME) IV Stopped (05/27/19 0601)  . dextrose 5% lactated ringers 75 mL/hr at 05/27/19 0900  . DOPamine 5 mcg/kg/min (05/27/19 0900)  . fentaNYL infusion INTRAVENOUS 300 mcg/hr (05/27/19 0900)  . furosemide (LASIX) infusion 4 mg/hr (05/27/19 0900)  . levETIRAcetam 1,000 mg (05/27/19 0505)  . midazolam 10 mg/hr (05/27/19 0900)  . norepinephrine (LEVOPHED) Adult infusion 4 mcg/min (05/27/19 0900)  . sodium chloride    . vancomycin     PRN Meds: acetaminophen, albuterol, fentaNYL, nitroGLYCERIN, ondansetron (ZOFRAN) IV   Vital Signs    Vitals:   05/27/19 0700 05/27/19 0740 05/27/19 0800 05/27/19 0900  BP: 115/84 (!) 96/58 114/78 112/86  Pulse:  (!) 105 (!) 105   Resp: (!) 22  (!) 22 (!) 22  Temp: 98.1 F (36.7 C)  99.3 F (37.4 C)   TempSrc: Bladder  Bladder   SpO2:  97% 93%   Weight:      Height:        Intake/Output Summary (Last 24 hours) at 05/27/2019 0918 Last data filed at 05/27/2019 0900 Gross per 24 hour  Intake 2935.34 ml  Output 3875 ml  Net -939.66 ml   Last 3 Weights  05/27/2019 05/26/2019 05/25/2019  Weight (lbs) 276 lb 14.7 oz 276 lb 0.6 oz 280 lb 3.6 oz  Weight (kg) 125.61 kg 125.21 kg 127.11 kg      Telemetry    Sinus bradycardia without ventricular ectopy - personally Reviewed  ECG    Sinus bradycardia 05/25/2019 at 729.  Normal PR interval-- Personally Reviewed  Physical Exam  Obese African-American male GEN:  Intubated Neck:  Not assessed Cardiac:  Regular rhythm without pericardial rub Respiratory:  Clear lung fields anterior bilateral GI:  No bowel sounds heard Neuro:   Sedated and paralyzed   Labs    High Sensitivity Troponin:   Recent Labs  Lab 05/18/2019 1110 05/17/2019 1251 05/23/19 0728 05/19/2019 1733 05/17/2019 1934  TROPONINIHS 52* 54* 58* 116* 13,071*      Chemistry Recent Labs  Lab 05/25/19 1423 05/25/19 1423 05/25/19 2019 05/25/19 2019 05/26/19 0230 05/26/19 0430 05/26/19 1453 05/26/19 1503 05/26/19 2256 05/26/19 2256 05/26/19 2327 05/27/19 0521 05/27/19 0526  NA 136   < > 138   < > 139   < > 136   < > 138   < > 140 138 141  K 4.5   < > 3.6   < > 3.6   < > 4.4   < > 4.4   < > 4.0  4.4 4.5  CL 105   < > 108   < > 108   < > 103  --  107  --   --  106  --   CO2 20*   < > 19*   < > 19*  --  22  --   --   --   --  22  --   GLUCOSE 130*   < > 127*   < > 132*   < > 116*  --  142*  --   --  119*  --   BUN 25*   < > 27*   < > 24*   < > 23*  --  27*  --   --  30*  --   CREATININE 1.67*   < > 1.57*   < > 1.51*   < > 2.21*  --  2.50*  --   --  2.71*  --   CALCIUM 7.8*   < > 7.8*   < > 7.9*  --  8.0*  --   --   --   --  8.0*  --   PROT 6.1*  --  6.3*  --  6.3*  --   --   --   --   --   --   --   --   ALBUMIN 2.8*  --  2.8*  --  2.8*  --   --   --   --   --   --   --   --   AST 296*  --  267*  --  235*  --   --   --   --   --   --   --   --   ALT 146*  --  142*  --  130*  --   --   --   --   --   --   --   --   ALKPHOS 90  --  87  --  86  --   --   --   --   --   --   --   --   BILITOT 0.6  --  0.4  --  0.3  --   --   --    --   --   --   --   --   GFRNONAA 44*   < > 48*   < > 50*  --  32*  --   --   --   --  25*  --   GFRAA 51*   < > 55*   < > 58*  --  37*  --   --   --   --  29*  --   ANIONGAP 11   < > 11   < > 12  --  11  --   --   --   --  10  --    < > = values in this interval not displayed.     Hematology Recent Labs  Lab 05/26/19 0438 05/26/19 0532 05/26/19 1453 05/26/19 1503 05/26/19 2327 05/27/19 0521 05/27/19 0526  WBC 10.4  --  14.8*  --   --  12.4*  --   RBC 5.38  --  5.56  --   --  5.33  --   HGB 12.5*   < > 12.9*   < > 13.3 12.3* 13.9  HCT 42.2   < > 43.8   < >  39.0 42.8 41.0  MCV 78.4*  --  78.8*  --   --  80.3  --   MCH 23.2*  --  23.2*  --   --  23.1*  --   MCHC 29.6*  --  29.5*  --   --  28.7*  --   RDW 16.4*  --  16.8*  --   --  17.0*  --   PLT 255  --  258  --   --  252  --    < > = values in this interval not displayed.    BNPNo results for input(s): BNP, PROBNP in the last 168 hours.   DDimer  Recent Labs  Lab 05/26/2019 1850  DDIMER 0.47     Radiology    DG CHEST PORT 1 VIEW  Result Date: 05/27/2019 CLINICAL DATA:  Ventricular fibrillation, CHF.  Endotracheal tube. EXAM: PORTABLE CHEST 1 VIEW COMPARISON:  05/26/2019. FINDINGS: Endotracheal tube, NG tube, left IJ line in stable position. Cardiomegaly with progressive bilateral pulmonary infiltrates/edema. Small bilateral pleural effusions. Findings consistent with progressive CHF. Bilateral pneumonia cannot be excluded. No pneumothorax. IMPRESSION: 1.  Lines and tubes in stable position. 2. Cardiomegaly with progressive bilateral pulmonary infiltrates/edema. Small bilateral pleural effusions. Findings consistent progressive CHF. Bilateral pneumonia cannot be excluded. Electronically Signed   By: Marcello Moores  Register   On: 05/27/2019 05:50   DG CHEST PORT 1 VIEW  Result Date: 05/26/2019 CLINICAL DATA:  Dyspnea. Evaluate for possible aspiration. Intubated patient. EXAM: PORTABLE CHEST 1 VIEW COMPARISON:  05/09/2019 FINDINGS:  There is opacity at both lung bases, likely a combination of atelectasis and pleural fluid. This has increased from the prior study. There is also bilateral perihilar opacity increased from the prior exam. These findings are accentuated by low lung volumes. Endotracheal tube tip projects 2 point 8 cm above the carina. Left internal jugular central venous catheter has its tip at the confluence of the brachiocephalic vein and superior vena cava. Nasal/orogastric tube passes below the diaphragm well into the stomach. IMPRESSION: 1. Worsened lung aeration compared to the prior study. There are perihilar bilateral lung base opacities. Suspect combination of pulmonary edema with lung base atelectasis and pleural effusions. Pneumonia or aspiration pneumonitis is not excluded. 2. Stable support apparatus. Electronically Signed   By: Lajean Manes M.D.   On: 05/26/2019 15:49   DG Abd Portable 1V  Result Date: 05/27/2019 CLINICAL DATA:  Impaired nasogastric feeding tube. EXAM: PORTABLE ABDOMEN - 1 VIEW COMPARISON:  One-view abdomen 05/29/2019. One-view chest x-ray 05/27/2019. FINDINGS: Side port of the NG tube is in the stomach. The stomach is decompressed. Left greater than right basilar airspace disease is noted. The heart is enlarged. Bowel gas pattern is unremarkable. IMPRESSION: 1. Side port of the NG tube is in the stomach. 2. Left greater than right basilar airspace disease. Electronically Signed   By: San Morelle M.D.   On: 05/27/2019 05:14    Cardiac Studies    Cardiac cath did not reveal significant coronary obstruction  Decreased LV systolic function with elevated filling pressures consistent with acute on chronic combined systolic and diastolic heart failure.  Patient Profile     59 y.o. male with a historyof hypertension, hyperlipidemia, asthma, arthritis, anxiety/depression,alcohol abuse, prior cocaine abuse, hypertension, debility due to osteoarthritis, and recently diagnosed  nonischemic cardiomyopathy with LVEF less than 35%.  In hospital V. fib cardiac arrest precipitated by R on T.  Prolonged CPR after relatively prolonged downtime (greater than 10 minutes). . Assessment &  Plan    1. Ventricular fibrillation cardiac arrest (R on T phenomenon): Will resume amiodarone to help prevent acute stress related A. fib and or potential recurrence of VT/VF 2. Chronic combined systolic and diastolic heart failure: Chest x-ray this morning does not demonstrate pulmonary edema.  Heart failure therapy when hemodynamically stable and weaned from drips. 3. Respiratory failure, hypoxic: Being managed by critical care.  Having increasing difficulty with oxygenation and ventilation. 4. Acute kidney injury: Worsening kidney function.  May be relatively dry.  Volume status based upon hemodynamic data. 5. Labile hemodynamics: This continues.  Still on both dopamine and Levophed.  Generally worse than yesterday.  There are some issues with oxygenation and acid-base status.  May need volume although already having difficulty with oxygenation and thus care with administration would be a must.  Prognosis is looking poor   For questions or updates, please contact [place] Please consult [email or web address] for contact info under        Signed, Sinclair Grooms, MD  05/27/2019, 9:18 AM

## 2019-05-28 ENCOUNTER — Inpatient Hospital Stay (HOSPITAL_COMMUNITY): Payer: Medicare Other

## 2019-05-28 DIAGNOSIS — L8990 Pressure ulcer of unspecified site, unspecified stage: Secondary | ICD-10-CM | POA: Insufficient documentation

## 2019-05-28 LAB — CBC WITH DIFFERENTIAL/PLATELET
Abs Immature Granulocytes: 0.04 10*3/uL (ref 0.00–0.07)
Basophils Absolute: 0 10*3/uL (ref 0.0–0.1)
Basophils Relative: 0 %
Eosinophils Absolute: 0 10*3/uL (ref 0.0–0.5)
Eosinophils Relative: 0 %
HCT: 40.8 % (ref 39.0–52.0)
Hemoglobin: 11.5 g/dL — ABNORMAL LOW (ref 13.0–17.0)
Immature Granulocytes: 1 %
Lymphocytes Relative: 15 %
Lymphs Abs: 1.3 10*3/uL (ref 0.7–4.0)
MCH: 23.2 pg — ABNORMAL LOW (ref 26.0–34.0)
MCHC: 28.2 g/dL — ABNORMAL LOW (ref 30.0–36.0)
MCV: 82.3 fL (ref 80.0–100.0)
Monocytes Absolute: 0.5 10*3/uL (ref 0.1–1.0)
Monocytes Relative: 5 %
Neutro Abs: 6.9 10*3/uL (ref 1.7–7.7)
Neutrophils Relative %: 79 %
Platelets: 241 10*3/uL (ref 150–400)
RBC: 4.96 MIL/uL (ref 4.22–5.81)
RDW: 17.5 % — ABNORMAL HIGH (ref 11.5–15.5)
WBC: 8.8 10*3/uL (ref 4.0–10.5)
nRBC: 0 % (ref 0.0–0.2)

## 2019-05-28 LAB — BASIC METABOLIC PANEL
Anion gap: 12 (ref 5–15)
BUN: 48 mg/dL — ABNORMAL HIGH (ref 6–20)
CO2: 19 mmol/L — ABNORMAL LOW (ref 22–32)
Calcium: 7.7 mg/dL — ABNORMAL LOW (ref 8.9–10.3)
Chloride: 105 mmol/L (ref 98–111)
Creatinine, Ser: 4.52 mg/dL — ABNORMAL HIGH (ref 0.61–1.24)
GFR calc Af Amer: 15 mL/min — ABNORMAL LOW (ref >60)
GFR calc non Af Amer: 13 mL/min — ABNORMAL LOW (ref >60)
Glucose, Bld: 128 mg/dL — ABNORMAL HIGH (ref 70–99)
Potassium: 4.8 mmol/L (ref 3.5–5.1)
Sodium: 136 mmol/L (ref 135–145)

## 2019-05-28 LAB — POCT I-STAT 7, (LYTES, BLD GAS, ICA,H+H)
Acid-base deficit: 5 mmol/L — ABNORMAL HIGH (ref 0.0–2.0)
Bicarbonate: 24.3 mmol/L (ref 20.0–28.0)
Calcium, Ion: 1.13 mmol/L — ABNORMAL LOW (ref 1.15–1.40)
HCT: 40 % (ref 39.0–52.0)
Hemoglobin: 13.6 g/dL (ref 13.0–17.0)
O2 Saturation: 93 %
Patient temperature: 37
Potassium: 4.5 mmol/L (ref 3.5–5.1)
Sodium: 139 mmol/L (ref 135–145)
TCO2: 26 mmol/L (ref 22–32)
pCO2 arterial: 60.2 mmHg — ABNORMAL HIGH (ref 32.0–48.0)
pH, Arterial: 7.214 — ABNORMAL LOW (ref 7.350–7.450)
pO2, Arterial: 82 mmHg — ABNORMAL LOW (ref 83.0–108.0)

## 2019-05-28 LAB — BLOOD GAS, ARTERIAL
Acid-base deficit: 8.7 mmol/L — ABNORMAL HIGH (ref 0.0–2.0)
Bicarbonate: 18.8 mmol/L — ABNORMAL LOW (ref 20.0–28.0)
Drawn by: 53325
FIO2: 60
O2 Saturation: 98.5 %
Patient temperature: 36.2
pCO2 arterial: 55.6 mmHg — ABNORMAL HIGH (ref 32.0–48.0)
pH, Arterial: 7.15 — CL (ref 7.350–7.450)
pO2, Arterial: 169 mmHg — ABNORMAL HIGH (ref 83.0–108.0)

## 2019-05-28 LAB — GLUCOSE, CAPILLARY
Glucose-Capillary: 124 mg/dL — ABNORMAL HIGH (ref 70–99)
Glucose-Capillary: 124 mg/dL — ABNORMAL HIGH (ref 70–99)
Glucose-Capillary: 116 mg/dL — ABNORMAL HIGH (ref 70–99)
Glucose-Capillary: 92 mg/dL (ref 70–99)
Glucose-Capillary: 101 mg/dL — ABNORMAL HIGH (ref 70–99)
Glucose-Capillary: 87 mg/dL (ref 70–99)

## 2019-05-28 LAB — MAGNESIUM
Magnesium: 1.9 mg/dL (ref 1.7–2.4)
Magnesium: 1.7 mg/dL (ref 1.7–2.4)

## 2019-05-28 LAB — CREATININE, SERUM
Creatinine, Ser: 4.51 mg/dL — ABNORMAL HIGH (ref 0.61–1.24)
GFR calc Af Amer: 15 mL/min — ABNORMAL LOW (ref >60)
GFR calc non Af Amer: 13 mL/min — ABNORMAL LOW (ref >60)

## 2019-05-28 LAB — PHOSPHORUS
Phosphorus: 7.5 mg/dL — ABNORMAL HIGH (ref 2.5–4.6)
Phosphorus: 5.8 mg/dL — ABNORMAL HIGH (ref 2.5–4.6)

## 2019-05-28 MED ORDER — SODIUM CHLORIDE 0.9 % IV SOLN
2.0000 g | INTRAVENOUS | Status: DC
  Administered 2019-05-29: 06:00:00 2 g via INTRAVENOUS
  Filled 2019-05-28: qty 2

## 2019-05-28 MED ORDER — NOREPINEPHRINE 16 MG/250ML-% IV SOLN
0.0000 ug/min | INTRAVENOUS | Status: DC
  Administered 2019-05-28: 15 ug/min via INTRAVENOUS
  Administered 2019-05-28: 12 ug/min via INTRAVENOUS
  Administered 2019-05-30: 16:00:00 6 ug/min via INTRAVENOUS
  Filled 2019-05-28 (×3): qty 250

## 2019-05-28 MED ORDER — DOBUTAMINE IN D5W 4-5 MG/ML-% IV SOLN
2.5000 ug/kg/min | INTRAVENOUS | Status: DC
  Administered 2019-05-28 – 2019-05-30 (×2): 2.5 ug/kg/min via INTRAVENOUS
  Filled 2019-05-28 (×2): qty 250

## 2019-05-28 NOTE — Progress Notes (Signed)
Spoke to sister Helene Kelp on the phone. She had concerns regarding  who was being notified of his condition. There is a Cousin Architect), who said she was the Aunt who has been the point of contact.   She states the Aunt has dementia and cannot make decisions Tamela Oddi is a cousin. We have not been able to locate the son, who would be next of kin.  Helene Kelp states she does not know his number and does not keep in touch with him.  Legally the sister would be next of kin to make decisions, as patient has no advance directive.

## 2019-05-28 NOTE — Progress Notes (Signed)
[employer] number for any verifications or information regarding patient son is [phone]

## 2019-05-28 NOTE — Progress Notes (Signed)
[facility] Progress Note Patient Name: Xavier Doyle DOB: January 20, 1961 MRN: [id number]   Date of Service  05/28/2019  HPI/Events of Note  ABG on 60%/PRVC 22/TV 540/P 10 = 7.15/55.6/169.  eICU Interventions  Will order: 1. Increase PRVC rate to 33. 2. Repeat ABG at 7:45 AM.     Intervention Category Major Interventions: Acid-Base disturbance - evaluation and management;Respiratory failure - evaluation and management  Lysle Dingwall 05/28/2019, 6:43 AM

## 2019-05-28 NOTE — Progress Notes (Signed)
Intermittent seizure-like activity noted. Mostly aggravated by stimuli. D/w EMD, Dr Oletta Darter. Pt already loaded and maintained with Keppra and high dose versed gtt. Vecuronium ordered PRN and will use for vent synchrony. Discussed plan with bedside RN.

## 2019-05-28 NOTE — Progress Notes (Signed)
RN noticed eye deviation and rhythmic like movement of extremities at 1857. elink CCM paged for seizure like activity. R and L eye deviated to right then to left with non-reactive pupils. Eyes returned to normal position. R eye then deviated to left. Seizure like activity subsided at 1905. Awaiting orders from [facility] provider.

## 2019-05-28 NOTE — Progress Notes (Signed)
CCM called and notified of critical pH of 7.15 this am.

## 2019-05-28 NOTE — Progress Notes (Signed)
RT note-Called to assess ALine. Undressed and assessed catheter and site. Catheter appears to have been kinked or bent at some point leaving the line hard to draw and read accurately. RN and MD aware. Line was redressed at this time. Recommend a new line.

## 2019-05-28 NOTE — Progress Notes (Addendum)
PULMONARY / CRITICAL CARE MEDICINE   NAME:  Xavier Doyle, MRN:  [id number], DOB:  1960-07-02, LOS: 4 ADMISSION DATE:  05/26/2019, CONSULTATION DATE: 05/13/2019 REFERRING MD: Family medicine teaching service, CHIEF COMPLAINT: Cardiac arrest  BRIEF HISTORY:    Xavier Doyle is a 59 year old male with hypertension, hyperlipidemia, asthma, depression who was admitted on 05/31/2019 for chest pain.  Patient was found to have newly diagnosed systolic heart failure with EF of 25-30% with global hypokinesis.  Left and right heart catheterization did not show any ischemic cardiac disease.  Patient's chest pain was thought to be due to hypertensive endorgan damage.  During hospitalization patient had a cardiac arrest.  He was found to be in torsades which converted into PEA.  Patient received epi x3, amiodarone, bicarb x2, defibrillation.  ROSC was obtained and patient was started on TTM along with dopamine.   SIGNIFICANT PAST MEDICAL HISTORY    Allergy, Anxiety, Arthritis, Asthma, Depression, Heart murmur, Hyperlipidemia, Hypertension, and Osteoporosis.  SIGNIFICANT EVENTS:   1/17 admitted to [facility] for chest pain 1/19 V. fib arrest subsequent to torsades, TTM initiated 1/20 Repeat TTE without new changes  1/21 EEG stopped, Aggressive diuresis with lasix drip and metolazone   STUDIES:    TTE 05/23/2019 Left ventricular ejection fraction, by visual estimation, is 25 to 30%. The left ventricle has severely decreased function. There is moderately increased left ventricular hypertrophy. 2. Left ventricular diastolic parameters are consistent with Grade I diastolic dysfunction (impaired relaxation). 3. The left ventricle demonstrates global hypokinesis. 4. Global right ventricle has mildly reduced systolic function.The right ventricular size is normal. No increase in right ventricular wall thickness. 5. Left atrial size was mildly dilated. 6. Right atrial size was mildly dilated. 7. The mitral valve is  normal in structure. Mild mitral valve regurgitation. No evidence of mitral stenosis. 8. The tricuspid valve is normal in structure. Tricuspid valve regurgitation is not demonstrated. 9. The aortic valve is tricuspid. Aortic valve regurgitation is mild. Mild aortic valve sclerosis without stenosis. 10. The inferior vena cava is normal in size with greater than 50% respiratory variability, suggesting right atrial pressure of 3 mmHg. 11. TR signal is inadequate for assessing pulmonary artery systolic pressure.  TTE 05/24/2018  Left ventricular ejection fraction, by visual estimation, is 25 to 30%. The left ventricle has  severely decreased function. There is mildly increased left ventricular hypertrophy. 2. Left ventricular diastolic parameters are consistent with Grade I diastolic dysfunction (impaired relaxation). 3. The left ventricle demonstrates global hypokinesis. 4. Global right ventricle has moderately reduced systolic function.The right ventricular size is normal. No increase in right ventricular wall thickness. 5. Left atrial size was normal. 6. Right atrial size was normal. 7. The mitral valve is normal in structure. Trivial mitral valve regurgitation. No evidence of mitral stenosis. 8. The tricuspid valve is normal in structure. 9. The tricuspid valve is normal in structure. Tricuspid valve regurgitation is trivial. 10. The aortic valve is tricuspid. Aortic valve regurgitation is trivial. No evidence of aortic valve sclerosis or stenosis. 11. The pulmonic valve was normal in structure. Pulmonic valve regurgitation is not visualized. 12. Moderately elevated pulmonary artery systolic pressure. 13. The tricuspid regurgitant velocity is 2.73 m/s, and with an assumed right atrial pressure of 15 mmHg, the estimated right ventricular systolic pressure is moderately elevated at 44.8 mmHg. 14. The inferior vena cava is dilated in size with <50% respiratory variability, suggesting right atrial  pressure of 15 mmHg. 15. No significant change from prior study.  Right/left heart  cath 05/23/2019  Mid RCA lesion is 10% stenosed. No significant CAD.  LV end diastolic pressure is moderately elevated. LVEDP 26 mm Hg.  There is no aortic valve stenosis.  Hemodynamic findings consistent with mild pulmonary hypertension.  Ao sat 96%, PA sat 67%, mean PA pressure 29 mm Hg; mean PCWP 20; CO 6.3 L/min, CI 2.72   Medical therapy for nonischemic cardiomyopathy.  CULTURES:   Covid negative Blood culture 1/21 no growth< 24hrs in 2 bottles Respiratory culture 1/22 pending  ANTIBIOTICS:  Vancomycin 1/21> Cefepime 1/21>  LINES/TUBES:  ETT 1/19 L CVC 1/19  CONSULTANTS:  PCCM Cardiology  SUBJECTIVE:  Patient is sedated and intubated.   CONSTITUTIONAL: BP 116/73   Pulse 88   Temp (!) 97.2 F (36.2 C)   Resp (!) 22   Ht 5\' 8"  (1.727 m)   Wt 125.6 kg   SpO2 100%   BMI 42.11 kg/m   I/O last 3 completed shifts: In: 5506.4 [I.V.:4555; NG/GT:305; IV Piggyback:646.4] Out: 2200 [Urine:1950; Emesis/NG output:250]  CVP:  [12 mmHg-42 mmHg] 25 mmHg  Vent Mode: PRVC FiO2 (%):  [60 %-80 %] 60 % Set Rate:  [22 bmp] 22 bmp Vt Set:  [540 mL] 540 mL PEEP:  [10 cmH20] 10 cmH20 Plateau Pressure:  [23 [id number] cmH20] 42 cmH20  PHYSIAM: General:  Diaphoretic, sedated Neuro: sluggish pupillary response, absent corneal response HEENT: et tube in place Cardiovascular:  s1 and s2 audible Lungs: distant breath sounds Abdomen: distended abdomen Musculoskeletal: no pitting edema, grossly edematous Skin: warm extremities  ASSESSMENT AND PLAN    Cardiogenic shock secondary to V. fib arrest on 05/29/2019 Patient continues to be on levophed and dopamine. Patient is to complete TTM protocol 1/23.  -Continue TTM protocol - nimbex, eeg, paralytic -continue versed -amiodarone discontinued due to low blood pressures, can restart if heart rates improve -titrate levophed  -discontinued  dopapime  -gave oral sedation with oxycodone, seroquel, klonopin -Maintain MAP>65 -Cardiology following appreciate recommendations -continue atorvastatin 80mg  qd  -cortrak ordered and d5LR given which patient is npo  Acute oliguric renal failure Cardiorenal syndrome.  Patient creatinine continues to increase 2.71>3.3>4.5. Due to poor diuresis and electrolyte abnormalities of hyperphosphatemia, hypocalcemia will need to consult nephrology.   -consulted palliative care for assistance with goals of care conversation as there is a complicated family dynamic  -Consult nephrology if not pursuing comfort care   Acute hypoxemic respiratory failure Thought to be secondary to cardiac arrest and pulmonary edema. On PRVC fio2 60%, rr 33, peep 10, tv 540. Patient still not having much urine output. Ph this morning showing ph 7.15, pco2 55, po2 169, bicarb 18 indicative of primary respiratory acidosis with metabolic acidosis.   -Repeat ABG pending after rr on ventilatory increased -Elevate head of bed -VAP protocol  HFrEF TTE showing EF of 25-30% with g1dd. Patient getting lasix drip and 10mg  metolazone bid and still without good urine output.   -chest xray pending -Will likely need ICD for optimal managment  Acute encephalopathy  Thought to be secondary to hypoxia as result of cardiac arrest vs infectious etiology. Treating for aspiration pneumonia.   EEG showing profound diffuse encephalopathy no seizures or epileptiform discharges 1/22.  -Continue ventilatory support -keppra for seizure precaution -Continue to do frequent neurological checks -blood culture no growth 2days  -urine culture cancelled for some reason -Respiratory culture growing gram positive cocci  -ua neg nitrite, leukocytes, wbc 11-20, rare bacteria  -stop vancomycin  -stop vancomycin  -switch to unasyn   Hyperphosphatemia  Phos=7.5 with calcium of 7.7.   -will contact nephrology   Transaminitis  Likely in  setting of shock   -will get cmp 1/24  Diabetes mellitus  A1C 6.4. Glucose ranging 100-140s.   -continue levemir 10u bid  Best Practice / Goals of Care / Disposition.   DVT PROPHYLAXIS: heparin sq SUP: protonix NUTRITION: npo MOBILITY: bedrest GOALS OF CARE: full FAMILY DISCUSSIONS: Spoke to patient's aunt DISPOSITION ICU  LABS  Glucose Recent Labs  Lab 05/27/19 0342 05/27/19 0845 05/27/19 1133 05/27/19 1518 05/27/19 2102 05/27/19 2333  GLUCAP 98 115* 133* 119* 149* 125*    BMET Recent Labs  Lab 05/27/19 0521 05/27/19 0526 05/27/19 0751 05/27/19 1856 05/28/19 0454  NA 138   < > 139 136 136  K 4.4   < > 4.5 4.7 4.8  CL 106  --   --  106 105  CO2 22  --   --  20* 19*  BUN 30*  --   --  40* 48*  CREATININE 2.71*  --   --  3.39* 4.52*  GLUCOSE 119*  --   --  145* 128*   < > = values in this interval not displayed.    Liver Enzymes Recent Labs  Lab 05/25/19 1423 05/25/19 2019 05/26/19 0230  AST 296* 267* 235*  ALT 146* 142* 130*  ALKPHOS 90 87 86  BILITOT 0.6 0.4 0.3  ALBUMIN 2.8* 2.8* 2.8*    Electrolytes Recent Labs  Lab 05/26/19 0438 05/26/19 1453 05/26/19 2329 05/27/19 0521 05/27/19 1856 05/28/19 0454  CALCIUM  --    < >  --  8.0* 7.5* 7.7*  MG 2.0  --  1.9  --  1.7 1.9  PHOS 3.9  --   --   --  7.2* 7.5*   < > = values in this interval not displayed.    CBC Recent Labs  Lab 05/26/19 1453 05/26/19 1503 05/27/19 0521 05/27/19 0521 05/27/19 0526 05/27/19 0751 05/28/19 0454  WBC 14.8*  --  12.4*  --   --   --  8.8  HGB 12.9*   < > 12.3*   < > 13.9 13.6 11.5*  HCT 43.8   < > 42.8   < > 41.0 40.0 40.8  PLT 258  --  252  --   --   --  241   < > = values in this interval not displayed.    ABG Recent Labs  Lab 05/27/19 0751 05/28/19 0454 05/28/19 0600  PHART 7.214* 7.150* 7.150*  PCO2ART 60.2* 58.5* 55.6*  PO2ART 82.0* 159* 169*    Coag's Recent Labs  Lab 05/22/2019 1733 05/25/19 0455  APTT 27 28  INR 1.1 1.0     Sepsis Markers No results for input(s): LATICACIDVEN, PROCALCITON, O2SATVEN in the last 168 hours.  Cardiac Enzymes No results for input(s): TROPONINI, PROBNP in the last 168 hours.  REVIEW OF SYSTEMS:    Patient is intuabed and sedated

## 2019-05-28 NOTE — Significant Event (Signed)
PCCM Overnight  At bedside. Discussed with RN myoclonic activity noted when pt stimulated otherwise none seen Elink aware and placed orders for NMB  Left radial A- line has poor waveform and no drawback Cuff pressures have been more accurate  Will discontinue radial A- line at this time. . Signed Dr Seward Carol Pulmonary Critical Care Locums

## 2019-05-28 NOTE — Progress Notes (Signed)
FPTS Social Progress Note  Patient continues to require ICU level care. We have been following along socially. FPTS will be happy to resume care in the event hat patient's condition improves and he becomes appropriate for the floor.   Caroline More, DO 05/28/2019, 5:16 AM PGY-3, [place] Medicine Service pager [phone]

## 2019-05-28 NOTE — Progress Notes (Signed)
Patient has a son, sister states he is in the [place]. She thinks that he was in [place] over Christmas, that is the last time they connected.   [employer] was contacted to let him know that his fathers condition is grave.   Case number [id number]

## 2019-05-28 NOTE — Progress Notes (Signed)
Progress Note  Patient Name: Xavier Doyle Date of Encounter: 05/28/2019  Primary Cardiologist: Sinclair Grooms, MD   Subjective   Intubated, rewarmed. Pectoral muscle fasciculations noted, likely contributing to ECG baseline artifact.   Inpatient Medications    Scheduled Meds: . artificial tears  1 application Both Eyes Z0C  . aspirin  81 mg Per Tube Daily  . atorvastatin  80 mg Per Tube q1800  . chlorhexidine gluconate (MEDLINE KIT)  15 mL Mouth Rinse BID  . Chlorhexidine Gluconate Cloth  6 each Topical Daily  . clonazepam  0.25 mg Per Tube BID  . feeding supplement (PRO-STAT SUGAR FREE 64)  60 mL Per Tube BID  . heparin  5,000 Units Subcutaneous Q8H  . insulin detemir  10 Units Subcutaneous Q12H  . mouth rinse  15 mL Mouth Rinse 10 times per day  . metolazone  10 mg Oral BID  . oxyCODONE  5 mg Per Tube Q6H  . pantoprazole (PROTONIX) IV  40 mg Intravenous Q24H  . QUEtiapine  25 mg Per Tube Daily  . vancomycin variable dose per unstable renal function (pharmacist dosing)   Does not apply See admin instructions   Continuous Infusions: . amiodarone 30 mg/hr (05/28/19 0700)  . ceFEPime (MAXIPIME) IV Stopped (05/28/19 0515)  . feeding supplement (VITAL AF 1.2 CAL) 1,000 mL (05/27/19 1715)  . fentaNYL infusion INTRAVENOUS 300 mcg/hr (05/28/19 5852)  . furosemide (LASIX) infusion 10 mg/hr (05/28/19 0912)  . levETIRAcetam Stopped (05/28/19 0559)  . midazolam 10 mg/hr (05/28/19 0700)  . norepinephrine (LEVOPHED) Adult infusion Stopped (05/28/19 7782)  . sodium chloride     PRN Meds: acetaminophen, albuterol, fentaNYL, ipratropium-albuterol, nitroGLYCERIN, ondansetron (ZOFRAN) IV, vecuronium   Vital Signs    Vitals:   05/28/19 0700 05/28/19 0715 05/28/19 0757 05/28/19 0930  BP: 92/72 104/60 (!) 70/38 94/71  Pulse:   76 73  Resp: (!) 33 (!) 33  (!) 33  Temp: (!) 97.2 F (36.2 C) (!) 97.3 F (36.3 C)  98.4 F (36.9 C)  TempSrc:      SpO2:   95% 100%  Weight:        Height:        Intake/Output Summary (Last 24 hours) at 05/28/2019 1024 Last data filed at 05/28/2019 1000 Gross per 24 hour  Intake 4081.29 ml  Output 218 ml  Net 3863.29 ml   Last 3 Weights 05/28/2019 05/27/2019 05/26/2019  Weight (lbs) 294 lb 5 oz 276 lb 14.7 oz 276 lb 0.6 oz  Weight (kg) 133.5 kg 125.61 kg 125.21 kg      Telemetry    Sinus rhythm- personally Reviewed  ECG    Sinus bradycardia, PAC 05/25/2019 at 7:29am.  Normal PR interval-- Personally Reviewed  Physical Exam   GEN:  Intubated Neck: unable to assess Cardiac:  Regular rhythm, normal rate. No murmur Respiratory:  Clear lung fields anterior bilateral, ventilator breath sounds GI:  No bowel sounds heard, obese abdomen. Neuro:   Sedated, intubated, unable to assess.   Labs    High Sensitivity Troponin:   Recent Labs  Lab 05/21/2019 1110 05/10/2019 1251 05/23/19 0728 05/28/2019 1733 05/27/2019 1934  TROPONINIHS 52* 54* 58* 116* 13,071*      Chemistry Recent Labs  Lab 05/25/19 1423 05/25/19 1423 05/25/19 2019 05/25/19 2019 05/26/19 0230 05/26/19 0430 05/27/19 0521 05/27/19 0526 05/27/19 0751 05/27/19 1856 05/28/19 0454  NA 136   < > 138   < > 139   < > 138   < >  139 136 136  K 4.5   < > 3.6   < > 3.6   < > 4.4   < > 4.5 4.7 4.8  CL 105   < > 108   < > 108   < > 106  --   --  106 105  CO2 20*   < > 19*   < > 19*   < > 22  --   --  20* 19*  GLUCOSE 130*   < > 127*   < > 132*   < > 119*  --   --  145* 128*  BUN 25*   < > 27*   < > 24*   < > 30*  --   --  40* 48*  CREATININE 1.67*   < > 1.57*   < > 1.51*   < > 2.71*  --   --  3.39* 4.52*  CALCIUM 7.8*   < > 7.8*   < > 7.9*   < > 8.0*  --   --  7.5* 7.7*  PROT 6.1*  --  6.3*  --  6.3*  --   --   --   --   --   --   ALBUMIN 2.8*  --  2.8*  --  2.8*  --   --   --   --   --   --   AST 296*  --  267*  --  235*  --   --   --   --   --   --   ALT 146*  --  142*  --  130*  --   --   --   --   --   --   ALKPHOS 90  --  87  --  86  --   --   --   --   --    --   BILITOT 0.6  --  0.4  --  0.3  --   --   --   --   --   --   GFRNONAA 44*   < > 48*   < > 50*   < > 25*  --   --  19* 13*  GFRAA 51*   < > 55*   < > 58*   < > 29*  --   --  22* 15*  ANIONGAP 11   < > 11   < > 12   < > 10  --   --  10 12   < > = values in this interval not displayed.     Hematology Recent Labs  Lab 05/26/19 1453 05/26/19 1503 05/27/19 0521 05/27/19 0521 05/27/19 0526 05/27/19 0751 05/28/19 0454  WBC 14.8*  --  12.4*  --   --   --  8.8  RBC 5.56  --  5.33  --   --   --  4.96  HGB 12.9*   < > 12.3*   < > 13.9 13.6 11.5*  HCT 43.8   < > 42.8   < > 41.0 40.0 40.8  MCV 78.8*  --  80.3  --   --   --  82.3  MCH 23.2*  --  23.1*  --   --   --  23.2*  MCHC 29.5*  --  28.7*  --   --   --  28.2*  RDW 16.8*  --  17.0*  --   --   --  17.5*  PLT 258  --  252  --   --   --  241   < > = values in this interval not displayed.    BNP Recent Labs  Lab 05/27/19 1601  BNP 584.4*     DDimer  Recent Labs  Lab 05/11/2019 1850  DDIMER 0.47     Radiology    DG CHEST PORT 1 VIEW  Result Date: 05/27/2019 CLINICAL DATA:  Ventricular fibrillation, CHF.  Endotracheal tube. EXAM: PORTABLE CHEST 1 VIEW COMPARISON:  05/26/2019. FINDINGS: Endotracheal tube, NG tube, left IJ line in stable position. Cardiomegaly with progressive bilateral pulmonary infiltrates/edema. Small bilateral pleural effusions. Findings consistent with progressive CHF. Bilateral pneumonia cannot be excluded. No pneumothorax. IMPRESSION: 1.  Lines and tubes in stable position. 2. Cardiomegaly with progressive bilateral pulmonary infiltrates/edema. Small bilateral pleural effusions. Findings consistent progressive CHF. Bilateral pneumonia cannot be excluded. Electronically Signed   By: Marcello Moores  Register   On: 05/27/2019 05:50   DG CHEST PORT 1 VIEW  Result Date: 05/26/2019 CLINICAL DATA:  Dyspnea. Evaluate for possible aspiration. Intubated patient. EXAM: PORTABLE CHEST 1 VIEW COMPARISON:  05/06/2019 FINDINGS:  There is opacity at both lung bases, likely a combination of atelectasis and pleural fluid. This has increased from the prior study. There is also bilateral perihilar opacity increased from the prior exam. These findings are accentuated by low lung volumes. Endotracheal tube tip projects 2 point 8 cm above the carina. Left internal jugular central venous catheter has its tip at the confluence of the brachiocephalic vein and superior vena cava. Nasal/orogastric tube passes below the diaphragm well into the stomach. IMPRESSION: 1. Worsened lung aeration compared to the prior study. There are perihilar bilateral lung base opacities. Suspect combination of pulmonary edema with lung base atelectasis and pleural effusions. Pneumonia or aspiration pneumonitis is not excluded. 2. Stable support apparatus. Electronically Signed   By: Lajean Manes M.D.   On: 05/26/2019 15:49   DG Abd Portable 1V  Result Date: 05/27/2019 CLINICAL DATA:  Feeding tube placement EXAM: PORTABLE ABDOMEN - 1 VIEW COMPARISON:  Abdominal radiograph obtained earlier in the day FINDINGS: Nasogastric tube has been removed. Feeding tube now present with tip in proximal jejunum. There is paucity of bowel gas. No bowel dilatation or free air evident. There is consolidation in the left lower lobe as well as more patchy infiltrate in the right base. IMPRESSION: 1.  Feeding tube tip in proximal jejunum. 2. Paucity of bowel gas. This finding raises concern for a degree of ileus or enteritis. Bowel obstruction less likely. No free air. 3. Consolidation throughout the left lower lobe with more patchy infiltrate left base. Small left pleural effusion cannot be excluded. Electronically Signed   By: Lowella Grip III M.D.   On: 05/27/2019 13:49   DG Abd Portable 1V  Result Date: 05/27/2019 CLINICAL DATA:  Impaired nasogastric feeding tube. EXAM: PORTABLE ABDOMEN - 1 VIEW COMPARISON:  One-view abdomen 05/13/2019. One-view chest x-ray 05/27/2019.  FINDINGS: Side port of the NG tube is in the stomach. The stomach is decompressed. Left greater than right basilar airspace disease is noted. The heart is enlarged. Bowel gas pattern is unremarkable. IMPRESSION: 1. Side port of the NG tube is in the stomach. 2. Left greater than right basilar airspace disease. Electronically Signed   By: San Morelle M.D.   On: 05/27/2019 05:14    Cardiac Studies    Cardiac cath did not reveal significant coronary obstruction  Decreased LV systolic function with elevated  filling pressures consistent with acute on chronic combined systolic and diastolic heart failure.  Patient Profile     59 y.o. male with a historyof hypertension, hyperlipidemia, asthma, arthritis, anxiety/depression,alcohol abuse, prior cocaine abuse, hypertension, debility due to osteoarthritis, and recently diagnosed nonischemic cardiomyopathy with LVEF less than 35%.  In hospital V. fib cardiac arrest precipitated by R on T.  Prolonged CPR after relatively prolonged downtime (greater than 10 minutes). . Assessment & Plan    1. Ventricular fibrillation cardiac arrest (R on T phenomenon): Amiodarone on hold due to low BP. No VT on tele.  - consider resuming amiodarone to help prevent acute stress related A. fib and or potential recurrence of VT/VF when able from standpoint of hypotension. 2. Chronic combined systolic and diastolic heart failure:  -  Heart failure therapy when hemodynamically stable, currently attempting diuresis without good urine output. Involving nephrology today.  3. Respiratory failure, hypoxic: Being managed by critical care.   4. Acute kidney injury: Worsening kidney function.  May be relatively dry.  Volume status based upon hemodynamic data, CCM getting nephrology on board.  5. Labile hemodynamics: off of dopamine, continues on norepi. A-line not reading well today, nursing addressing.   For questions or updates, please contact [place] Please  consult [email or web address] for contact info under        Signed, Elouise Munroe, MD  05/28/2019, 10:24 AM

## 2019-05-28 NOTE — Progress Notes (Signed)
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 I told him that he needed to think about limitation or withdrawal of care at this point in time.   eICU Interventions   I encouraged him to call the [facility] at [phone] tomorrow and speak with the physicians providing daily medical care.      Intervention Category Major Interventions: Other:  Lysle Dingwall 05/28/2019, 10:07 PM

## 2019-05-29 ENCOUNTER — Inpatient Hospital Stay (HOSPITAL_COMMUNITY): Payer: Medicare Other

## 2019-05-29 LAB — POCT I-STAT 7, (LYTES, BLD GAS, ICA,H+H)
Acid-base deficit: 9 mmol/L — ABNORMAL HIGH (ref 0.0–2.0)
Acid-base deficit: 5 mmol/L — ABNORMAL HIGH (ref 0.0–2.0)
Bicarbonate: 17.9 mmol/L — ABNORMAL LOW (ref 20.0–28.0)
Bicarbonate: 19.9 mmol/L — ABNORMAL LOW (ref 20.0–28.0)
Calcium, Ion: 1.07 mmol/L — ABNORMAL LOW (ref 1.15–1.40)
Calcium, Ion: 1.13 mmol/L — ABNORMAL LOW (ref 1.15–1.40)
HCT: 34 % — ABNORMAL LOW (ref 39.0–52.0)
HCT: 33 % — ABNORMAL LOW (ref 39.0–52.0)
Hemoglobin: 11.6 g/dL — ABNORMAL LOW (ref 13.0–17.0)
Hemoglobin: 11.2 g/dL — ABNORMAL LOW (ref 13.0–17.0)
O2 Saturation: 99 %
O2 Saturation: 99 %
Patient temperature: 36.6
Patient temperature: 38.1
Potassium: 4.6 mmol/L (ref 3.5–5.1)
Potassium: 3.4 mmol/L — ABNORMAL LOW (ref 3.5–5.1)
Sodium: 137 mmol/L (ref 135–145)
Sodium: 138 mmol/L (ref 135–145)
TCO2: 19 mmol/L — ABNORMAL LOW (ref 22–32)
TCO2: 21 mmol/L — ABNORMAL LOW (ref 22–32)
pCO2 arterial: 39.2 mmHg (ref 32.0–48.0)
pCO2 arterial: 38.9 mmHg (ref 32.0–48.0)
pH, Arterial: 7.266 — ABNORMAL LOW (ref 7.350–7.450)
pH, Arterial: 7.322 — ABNORMAL LOW (ref 7.350–7.450)
pO2, Arterial: 162 mmHg — ABNORMAL HIGH (ref 83.0–108.0)
pO2, Arterial: 148 mmHg — ABNORMAL HIGH (ref 83.0–108.0)

## 2019-05-29 LAB — BLOOD GAS, ARTERIAL
Acid-base deficit: 7.9 mmol/L — ABNORMAL HIGH (ref 0.0–2.0)
Acid-base deficit: 5.8 mmol/L — ABNORMAL HIGH (ref 0.0–2.0)
Bicarbonate: 19.5 mmol/L — ABNORMAL LOW (ref 20.0–28.0)
Bicarbonate: 17.9 mmol/L — ABNORMAL LOW (ref 20.0–28.0)
FIO2: 50
O2 Saturation: 98.5 %
O2 Saturation: 97.5 %
Patient temperature: 36
pCO2 arterial: 58.5 mmHg — ABNORMAL HIGH (ref 32.0–48.0)
pCO2 arterial: 26.7 mmHg — ABNORMAL LOW (ref 32.0–48.0)
pH, Arterial: 7.15 — CL (ref 7.350–7.450)
pH, Arterial: 7.436 (ref 7.350–7.450)
pO2, Arterial: 159 mmHg — ABNORMAL HIGH (ref 83.0–108.0)
pO2, Arterial: 94.7 mmHg (ref 83.0–108.0)

## 2019-05-29 LAB — CBC WITH DIFFERENTIAL/PLATELET
Abs Immature Granulocytes: 0.15 K/uL — ABNORMAL HIGH (ref 0.00–0.07)
Basophils Absolute: 0 K/uL (ref 0.0–0.1)
Basophils Relative: 0 %
Eosinophils Absolute: 0 K/uL (ref 0.0–0.5)
Eosinophils Relative: 0 %
HCT: 33.5 % — ABNORMAL LOW (ref 39.0–52.0)
Hemoglobin: 10 g/dL — ABNORMAL LOW (ref 13.0–17.0)
Immature Granulocytes: 2 %
Lymphocytes Relative: 17 %
Lymphs Abs: 1.6 K/uL (ref 0.7–4.0)
MCH: 23 pg — ABNORMAL LOW (ref 26.0–34.0)
MCHC: 29.9 g/dL — ABNORMAL LOW (ref 30.0–36.0)
MCV: 77 fL — ABNORMAL LOW (ref 80.0–100.0)
Monocytes Absolute: 0.7 K/uL (ref 0.1–1.0)
Monocytes Relative: 8 %
Neutro Abs: 6.7 K/uL (ref 1.7–7.7)
Neutrophils Relative %: 73 %
Platelets: 204 K/uL (ref 150–400)
RBC: 4.35 MIL/uL (ref 4.22–5.81)
RDW: 17 % — ABNORMAL HIGH (ref 11.5–15.5)
WBC: 9.2 K/uL (ref 4.0–10.5)
nRBC: 0.8 % — ABNORMAL HIGH (ref 0.0–0.2)

## 2019-05-29 LAB — TRIGLYCERIDES: Triglycerides: 201 mg/dL — ABNORMAL HIGH (ref <150)

## 2019-05-29 LAB — BASIC METABOLIC PANEL WITH GFR
Anion gap: 14 (ref 5–15)
BUN: 67 mg/dL — ABNORMAL HIGH (ref 6–20)
CO2: 20 mmol/L — ABNORMAL LOW (ref 22–32)
Calcium: 7.5 mg/dL — ABNORMAL LOW (ref 8.9–10.3)
Chloride: 104 mmol/L (ref 98–111)
Creatinine, Ser: 4.35 mg/dL — ABNORMAL HIGH (ref 0.61–1.24)
GFR calc Af Amer: 16 mL/min — ABNORMAL LOW
GFR calc non Af Amer: 14 mL/min — ABNORMAL LOW
Glucose, Bld: 120 mg/dL — ABNORMAL HIGH (ref 70–99)
Potassium: 3.4 mmol/L — ABNORMAL LOW (ref 3.5–5.1)
Sodium: 138 mmol/L (ref 135–145)

## 2019-05-29 LAB — CULTURE, RESPIRATORY W GRAM STAIN

## 2019-05-29 LAB — PHOSPHORUS: Phosphorus: 4.2 mg/dL (ref 2.5–4.6)

## 2019-05-29 LAB — GLUCOSE, CAPILLARY
Glucose-Capillary: 74 mg/dL (ref 70–99)
Glucose-Capillary: 100 mg/dL — ABNORMAL HIGH (ref 70–99)
Glucose-Capillary: 135 mg/dL — ABNORMAL HIGH (ref 70–99)
Glucose-Capillary: 116 mg/dL — ABNORMAL HIGH (ref 70–99)
Glucose-Capillary: 124 mg/dL — ABNORMAL HIGH (ref 70–99)

## 2019-05-29 LAB — MAGNESIUM: Magnesium: 1.6 mg/dL — ABNORMAL LOW (ref 1.7–2.4)

## 2019-05-29 MED ORDER — MIDAZOLAM BOLUS VIA INFUSION
4.0000 mg | INTRAVENOUS | Status: DC | PRN
  Administered 2019-05-29 – 2019-06-09 (×14): 4 mg via INTRAVENOUS
  Administered 2019-06-10: 2 mg via INTRAVENOUS
  Administered 2019-06-10 – 2019-06-11 (×6): 4 mg via INTRAVENOUS
  Administered 2019-06-11: 13:00:00 2 mg via INTRAVENOUS
  Administered 2019-06-11: 08:00:00 4 mg via INTRAVENOUS
  Administered 2019-06-11: 13:00:00 2 mg via INTRAVENOUS
  Administered 2019-06-11: 11:00:00 4 mg via INTRAVENOUS
  Administered 2019-06-11: 2 mg via INTRAVENOUS
  Administered 2019-06-12: 17:00:00 4 mg via INTRAVENOUS
  Filled 2019-05-29: qty 4

## 2019-05-29 MED ORDER — MAGNESIUM SULFATE 2 GM/50ML IV SOLN
2.0000 g | Freq: Once | INTRAVENOUS | Status: AC
  Administered 2019-05-29: 10:00:00 2 g via INTRAVENOUS
  Filled 2019-05-29: qty 50

## 2019-05-29 MED ORDER — POTASSIUM CHLORIDE 20 MEQ/15ML (10%) PO SOLN: 20.0000 meq | Freq: Once | ORAL | Status: DC

## 2019-05-29 MED ORDER — INSULIN DETEMIR 100 UNIT/ML ~~LOC~~ SOLN
5.0000 [IU] | Freq: Every day | SUBCUTANEOUS | Status: DC
  Administered 2019-05-30 – 2019-06-11 (×13): 5 [IU] via SUBCUTANEOUS
  Filled 2019-05-29 (×14): qty 0.05

## 2019-05-29 MED ORDER — POTASSIUM CHLORIDE 20 MEQ/15ML (10%) PO SOLN: 40.0000 meq | Freq: Once | ORAL | Status: DC

## 2019-05-29 MED ORDER — POTASSIUM CHLORIDE 20 MEQ/15ML (10%) PO SOLN
40.0000 meq | Freq: Once | ORAL | Status: AC
  Administered 2019-05-29: 40 meq via ORAL
  Filled 2019-05-29: qty 30

## 2019-05-29 MED ORDER — VALPROIC ACID 250 MG/5ML PO SOLN
500.0000 mg | Freq: Two times a day (BID) | ORAL | Status: DC
  Administered 2019-05-29 – 2019-06-12 (×28): 500 mg
  Filled 2019-05-29 (×30): qty 10

## 2019-05-29 MED ORDER — SODIUM CHLORIDE 0.9 % IV SOLN
1.0000 g | INTRAVENOUS | Status: AC
  Administered 2019-05-30 – 2019-06-01 (×3): 1 g via INTRAVENOUS
  Filled 2019-05-29 (×3): qty 1

## 2019-05-29 MED ORDER — VALPROATE SODIUM 500 MG/5ML IV SOLN
1000.0000 mg | Freq: Once | INTRAVENOUS | Status: AC
  Administered 2019-05-29: 10:00:00 1000 mg via INTRAVENOUS
  Filled 2019-05-29: qty 10

## 2019-05-29 NOTE — Progress Notes (Signed)
FPTS Social Progress Note  Patient continues to require ICU level care. We have been following along socially. FPTS will be happy to resume care in the event hat patient's condition improves and he becomes appropriate for the floor.   Guadalupe Dawn, MD 05/29/2019, 8:03 AM PGY-3, [place] Medicine Service pager [phone]

## 2019-05-29 NOTE — Progress Notes (Signed)
PULMONARY / CRITICAL CARE MEDICINE   NAME:  Xavier Doyle, MRN:  [id number], DOB:  [birth year], LOS: 5 ADMISSION DATE:  05/29/2019, CONSULTATION DATE: 05/26/2019 REFERRING MD: Family medicine teaching service, CHIEF COMPLAINT: Cardiac arrest  BRIEF HISTORY:    [name] is a 59 year old male with hypertension, hyperlipidemia, asthma, depression who was admitted on 05/29/2019 for chest pain.  Patient was found to have newly diagnosed systolic heart failure with EF of 25-30% with global hypokinesis.  Left and right heart catheterization did not show any ischemic cardiac disease.  Patient's chest pain was thought to be due to hypertensive endorgan damage.  During hospitalization patient had a cardiac arrest.  He was found to be in torsades which converted into PEA.  Patient received epi x3, amiodarone, bicarb x2, defibrillation.  ROSC was obtained and patient was started on TTM along with dopamine.   SIGNIFICANT PAST MEDICAL HISTORY    Allergy, Anxiety, Arthritis, Asthma, Depression, Heart murmur, Hyperlipidemia, Hypertension, and Osteoporosis.  SIGNIFICANT EVENTS:  1/17 admitted to [facility] for chest pain 1/19 V. fib arrest subsequent to torsades, TTM initiated 1/20 Repeat TTE without new changes  1/21 EEG stopped, Aggressive diuresis with lasix drip and metolazone   STUDIES:   TTE 05/23/2019 Left ventricular ejection fraction, by visual estimation, is 25 to 30%. The left ventricle has severely decreased function. There is moderately increased left ventricular hypertrophy. 2. Left ventricular diastolic parameters are consistent with Grade I diastolic dysfunction (impaired relaxation). 3. The left ventricle demonstrates global hypokinesis. 4. Global right ventricle has mildly reduced systolic function.The right ventricular size is normal. No increase in right ventricular wall thickness. 5. Left atrial size was mildly dilated. 6. Right atrial size was mildly dilated. 7. The mitral valve is normal  in structure. Mild mitral valve regurgitation. No evidence of mitral stenosis. 8. The tricuspid valve is normal in structure. Tricuspid valve regurgitation is not demonstrated. 9. The aortic valve is tricuspid. Aortic valve regurgitation is mild. Mild aortic valve sclerosis without stenosis. 10. The inferior vena cava is normal in size with greater than 50% respiratory variability, suggesting right atrial pressure of 3 mmHg. 11. TR signal is inadequate for assessing pulmonary artery systolic pressure.  TTE 05/24/2018  Left ventricular ejection fraction, by visual estimation, is 25 to 30%. The left ventricle has  severely decreased function. There is mildly increased left ventricular hypertrophy. 2. Left ventricular diastolic parameters are consistent with Grade I diastolic dysfunction (impaired relaxation). 3. The left ventricle demonstrates global hypokinesis. 4. Global right ventricle has moderately reduced systolic function.The right ventricular size is normal. No increase in right ventricular wall thickness. 5. Left atrial size was normal. 6. Right atrial size was normal. 7. The mitral valve is normal in structure. Trivial mitral valve regurgitation. No evidence of mitral stenosis. 8. The tricuspid valve is normal in structure. 9. The tricuspid valve is normal in structure. Tricuspid valve regurgitation is trivial. 10. The aortic valve is tricuspid. Aortic valve regurgitation is trivial. No evidence of aortic valve sclerosis or stenosis. 11. The pulmonic valve was normal in structure. Pulmonic valve regurgitation is not visualized. 12. Moderately elevated pulmonary artery systolic pressure. 13. The tricuspid regurgitant velocity is 2.73 m/s, and with an assumed right atrial pressure of 15 mmHg, the estimated right ventricular systolic pressure is moderately elevated at 44.8 mmHg. 14. The inferior vena cava is dilated in size with <50% respiratory variability, suggesting right atrial pressure  of 15 mmHg. 15. No significant change from prior study.  Right/left heart cath 05/06/2019  Mid RCA lesion is 10% stenosed. No significant CAD.  LV end diastolic pressure is moderately elevated. LVEDP 26 mm Hg.  There is no aortic valve stenosis.  Hemodynamic findings consistent with mild pulmonary hypertension.  Ao sat 96%, PA sat 67%, mean PA pressure 29 mm Hg; mean PCWP 20; CO 6.3 L/min, CI 2.72 Medical therapy for nonischemic cardiomyopathy.  CULTURES:  Covid negative Blood culture 1/21 ngtd Respiratory culture 1/22 with rare staph aureus   ANTIBIOTICS:  Vancomycin 1/21>1/23 Cefepime 1/21>1/23 Unasyn 1/23 >  LINES/TUBES:  ETT 1/19 L CVC 1/19  CONSULTANTS:  PCCM Cardiology  SUBJECTIVE:  Patient is sedated and intubated.   CONSTITUTIONAL: BP 114/66   Pulse 82   Temp (!) 97.5 F (36.4 C)   Resp (!) 33   Ht 5\' 8"  (1.727 m)   Wt 135.7 kg   SpO2 100%   BMI 45.49 kg/m   I/O last 3 completed shifts: In: 5656.3 [I.V.:3620.5; NG/GT:1340; IV Piggyback:695.8] Out: [id number] [Urine:3894; Stool:220]  CVP:  [9 mmHg-17 mmHg] 9 mmHg  Vent Mode: PRVC FiO2 (%):  [50 %-60 %] 50 % Set Rate:  [33 bmp] 33 bmp Vt Set:  [540 mL] 540 mL PEEP:  [10 cmH20] 10 cmH20 Plateau Pressure:  [28 cmH20-32 cmH20] 28 cmH20  PHYSIAM: General: Intubated, sedated, afebrile, nondiaphoretic HEENT: PEERL Cardio: RRR, no mrg's  Pulmonary: Ventilator sounds, no wheezing or crackles  Abdomen: Bowel sounds normal, soft, nontender  MSK: BLE nontender, mildly edematous Neuro: Sedated, no response to stimuli Skin: Cool to touch  ASSESSMENT AND PLAN   Cardiogenic shock secondary to V. fib arrest on 05/29/2019 Patient continues to be on levophed and dobutamine. Patient has completed TTM protocol as of 1/23. Now with seizures vs myoclonus. Plan -Amiodarone discontinued due to low blood pressures, cardiology recommending restarting if blood pressure improves -Titrate levophed to maintain MAP  >65 -Discontinued dopapime  -Daily SBT -Cardiology following appreciate recommendations -continue atorvastatin 80mg  qd   Acute oliguric renal failure Cardiorenal syndrome Patients creatinine stable today with a GFR ~15. Due to poor diuresis and electrolyte abnormalities of hyperphosphatemia, hypocalcemia will need to consult nephrology.  Plan -Consulted palliative care for assistance with goals of care conversation as there is a complicated family dynamic  -Will need to consult nephrology if not pursuing comfort care   Acute hypoxemic respiratory failure Thought to be secondary to cardiac arrest and pulmonary edema. On PRVC weaning daily. Patients urine output greatly increased.  Plan -ABG this am as rate 33? -Elevate head of bed -VAP protocol  HFrEF TTE showing EF of 25-30% with g1dd. Patient getting lasix drip and 10mg  metolazone bid and still without good urine output.  Plan -Will likely need ICD for optimal management if full scope of care per family -Appreciate cardiologies recommendations and assistance  Acute encephalopathy  Thought to be secondary to hypoxia as result of cardiac arrest vs infectious etiology. Treating for aspiration pneumonia. EEG showing profound diffuse encephalopathy now with seizures overnight on 1/23. Plan -Continue ventilatory support -Continue keppra 1000mg  Q12 -Consider neurology consult -Versed 4mg  bolus PRN for seizures -infection as above  Hyperphosphatemia  Resolved with diuresis.  PLan -Monitor  Transaminitis  Likely in setting of shock  Plan -will get cmp 1/24  Diabetes mellitus  A1C 6.4. Glucose ranging 100-140s. Plan -continue levemir 10u bid  Best Practice / Goals of Care / Disposition.   DVT PROPHYLAXIS: heparin sq SUP: protonix NUTRITION: npo MOBILITY: bedrest GOALS OF CARE: full FAMILY DISCUSSIONS: Will update family again today.  DISPOSITION ICU  LABS  Glucose Recent Labs  Lab 05/28/19 1231 05/28/19 1640  05/28/19 1938 05/28/19 2259 05/28/19 2317 05/29/19 0403  GLUCAP 124* 116* 92 87 101* 74   BMET Recent Labs  Lab 05/27/19 1856 05/27/19 1856 05/28/19 0454 05/28/19 1634 05/29/19 0344  NA 136  --  136  --  138  K 4.7  --  4.8  --  3.4*  CL 106  --  105  --  104  CO2 20*  --  19*  --  20*  BUN 40*  --  48*  --  67*  CREATININE 3.39*   < > 4.52* 4.51* 4.35*  GLUCOSE 145*  --  128*  --  120*   < > = values in this interval not displayed.   Liver Enzymes Recent Labs  Lab 05/25/19 1423 05/25/19 2019 05/26/19 0230  AST 296* 267* 235*  ALT 146* 142* 130*  ALKPHOS 90 87 86  BILITOT 0.6 0.4 0.3  ALBUMIN 2.8* 2.8* 2.8*   Electrolytes Recent Labs  Lab 05/27/19 1856 05/27/19 1856 05/28/19 0454 05/28/19 1634 05/29/19 0344  CALCIUM 7.5*  --  7.7*  --  7.5*  MG 1.7   < > 1.9 1.7 1.6*  PHOS 7.2*   < > 7.5* 5.8* 4.2   < > = values in this interval not displayed.   CBC Recent Labs  Lab 05/27/19 0521 05/27/19 0526 05/27/19 0751 05/28/19 0454 05/29/19 0344  WBC 12.4*  --   --  8.8 9.2  HGB 12.3*   < > 13.6 11.5* 10.0*  HCT 42.8   < > 40.0 40.8 33.5*  PLT 252  --   --  241 204   < > = values in this interval not displayed.   ABG Recent Labs  Lab 05/27/19 0751 05/28/19 0454 05/28/19 0600  PHART 7.214* 7.150* 7.150*  PCO2ART 60.2* 58.5* 55.6*  PO2ART 82.0* 159* 169*   Coag's Recent Labs  Lab 05/20/2019 1733 05/25/19 0455  APTT 27 28  INR 1.1 1.0   Sepsis Markers No results for input(s): LATICACIDVEN, PROCALCITON, O2SATVEN in the last 168 hours.  Cardiac Enzymes No results for input(s): TROPONINI, PROBNP in the last 168 hours.  REVIEW OF SYSTEMS:    Patient is intuabed and sedated   Kathi Ludwig, MD [place] Resident, PGY-3  Please see Attending A/P and/or Addendum for final recommendations.

## 2019-05-29 NOTE — Progress Notes (Signed)
Progress Note  Patient Name: Xavier Doyle Date of Encounter: 05/29/2019  Primary Cardiologist: Belva Crome III, MD   Subjective   Intubated, cvp 10, making some urine output. Back on amiodarone, frequent PVCs  Inpatient Medications    Scheduled Meds: . artificial tears  1 application Both Eyes E3P  . aspirin  81 mg Per Tube Daily  . atorvastatin  80 mg Per Tube q1800  . chlorhexidine gluconate (MEDLINE KIT)  15 mL Mouth Rinse BID  . Chlorhexidine Gluconate Cloth  6 each Topical Daily  . clonazepam  0.25 mg Per Tube BID  . feeding supplement (PRO-STAT SUGAR FREE 64)  60 mL Per Tube BID  . heparin  5,000 Units Subcutaneous Q8H  . insulin detemir  10 Units Subcutaneous Q12H  . mouth rinse  15 mL Mouth Rinse 10 times per day  . metolazone  10 mg Oral BID  . oxyCODONE  5 mg Per Tube Q6H  . pantoprazole (PROTONIX) IV  40 mg Intravenous Q24H  . QUEtiapine  25 mg Per Tube Daily  . valproic acid  500 mg Per Tube BID   Continuous Infusions: . amiodarone 30 mg/hr (05/29/19 0700)  . ceFEPime (MAXIPIME) IV Stopped (05/29/19 0604)  . DOBUTamine 2.5 mcg/kg/min (05/29/19 0700)  . feeding supplement (VITAL AF 1.2 CAL) 45 mL/hr at 05/28/19 1300  . fentaNYL infusion INTRAVENOUS 300 mcg/hr (05/29/19 0735)  . furosemide (LASIX) infusion 10 mg/hr (05/29/19 0700)  . levETIRAcetam Stopped (05/29/19 0410)  . magnesium sulfate bolus IVPB 2 g (05/29/19 1019)  . midazolam 10 mg/hr (05/29/19 0847)  . norepinephrine (LEVOPHED) Adult infusion 7 mcg/min (05/29/19 0700)  . sodium chloride    . valproate sodium 1,000 mg (05/29/19 1007)   PRN Meds: acetaminophen, albuterol, fentaNYL, ipratropium-albuterol, midazolam, nitroGLYCERIN, ondansetron (ZOFRAN) IV, vecuronium   Vital Signs    Vitals:   05/29/19 0730 05/29/19 0745 05/29/19 0756 05/29/19 0800  BP: (!) 125/93 114/66 114/66   Pulse: (!) 58 79 82   Resp: (!) 26 (!) 33 (!) 33   Temp: (!) 97.5 F (36.4 C) (!) 97.5 F (36.4 C)    TempSrc:       SpO2: 100% 100% 100% 100%  Weight:      Height:        Intake/Output Summary (Last 24 hours) at 05/29/2019 1026 Last data filed at 05/29/2019 0700 Gross per 24 hour  Intake 3128.7 ml  Output 4008 ml  Net -879.3 ml   Last 3 Weights 05/29/2019 05/28/2019 05/27/2019  Weight (lbs) 299 lb 2.6 oz 294 lb 5 oz 276 lb 14.7 oz  Weight (kg) 135.7 kg 133.5 kg 125.61 kg      Telemetry    Sinus rhythm, frequent pvcs- personally Reviewed  ECG    Sinus bradycardia, PAC 05/25/2019 at 7:29am.  Normal PR interval-- Personally Reviewed  Physical Exam   GEN: intubated Neck: unable to assess Cardiac:  regular rhythm normal rate, no murmurs Respiratory:  ventilator breath sounds GI:  no bowel sounds. Abdomen distended but remains somewhat soft Neuro:   sedated, intubated, unable to assess   Labs    High Sensitivity Troponin:   Recent Labs  Lab 05/31/2019 1110 05/12/2019 1251 05/23/19 0728 05/08/2019 1733 05/25/2019 1934  TROPONINIHS 52* 54* 58* 116* 13,071*      Chemistry Recent Labs  Lab 05/25/19 1423 05/25/19 1423 05/25/19 2019 05/25/19 2019 05/26/19 0230 05/26/19 0430 05/27/19 1856 05/27/19 1856 05/28/19 0454 05/28/19 1634 05/29/19 0344  NA 136   < >  138   < > 139   < > 136  --  136  --  138  K 4.5   < > 3.6   < > 3.6   < > 4.7  --  4.8  --  3.4*  CL 105   < > 108   < > 108   < > 106  --  105  --  104  CO2 20*   < > 19*   < > 19*   < > 20*  --  19*  --  20*  GLUCOSE 130*   < > 127*   < > 132*   < > 145*  --  128*  --  120*  BUN 25*   < > 27*   < > 24*   < > 40*  --  48*  --  67*  CREATININE 1.67*   < > 1.57*   < > 1.51*   < > 3.39*   < > 4.52* 4.51* 4.35*  CALCIUM 7.8*   < > 7.8*   < > 7.9*   < > 7.5*  --  7.7*  --  7.5*  PROT 6.1*  --  6.3*  --  6.3*  --   --   --   --   --   --   ALBUMIN 2.8*  --  2.8*  --  2.8*  --   --   --   --   --   --   AST 296*  --  267*  --  235*  --   --   --   --   --   --   ALT 146*  --  142*  --  130*  --   --   --   --   --   --   ALKPHOS 90   --  87  --  86  --   --   --   --   --   --   BILITOT 0.6  --  0.4  --  0.3  --   --   --   --   --   --   GFRNONAA 44*   < > 48*   < > 50*   < > 19*   < > 13* 13* 14*  GFRAA 51*   < > 55*   < > 58*   < > 22*   < > 15* 15* 16*  ANIONGAP 11   < > 11   < > 12   < > 10  --  12  --  14   < > = values in this interval not displayed.     Hematology Recent Labs  Lab 05/27/19 0521 05/27/19 0526 05/27/19 0751 05/28/19 0454 05/29/19 0344  WBC 12.4*  --   --  8.8 9.2  RBC 5.33  --   --  4.96 4.35  HGB 12.3*   < > 13.6 11.5* 10.0*  HCT 42.8   < > 40.0 40.8 33.5*  MCV 80.3  --   --  82.3 77.0*  MCH 23.1*  --   --  23.2* 23.0*  MCHC 28.7*  --   --  28.2* 29.9*  RDW 17.0*  --   --  17.5* 17.0*  PLT 252  --   --  241 204   < > = values in this interval not displayed.    BNP Recent Labs  Lab 05/27/19 1601  BNP 584.4*     DDimer  Recent Labs  Lab 05/25/2019 1850  DDIMER 0.47     Radiology    DG CHEST PORT 1 VIEW  Result Date: 05/28/2019 CLINICAL DATA:  Dyspnea EXAM: PORTABLE CHEST 1 VIEW COMPARISON:  05/27/2019 FINDINGS: Moderate interstitial/airspace opacities in the lungs bilaterally. Small to moderate left pleural effusion. No pneumothorax. Cardiomegaly. Endotracheal tube terminates 3.5 cm above the carina. Left IJ venous catheter terminates in the mid SVC. Enteric tube courses into the stomach. IMPRESSION: Moderate interstitial/airspace opacities in the lungs bilaterally, favoring interstitial edema over multifocal pneumonia, unchanged. Small to moderate left pleural effusion. Endotracheal tube terminates 3.5 cm above the carina. Additional support apparatus as above. Electronically Signed   By: Julian Hy M.D.   On: 05/28/2019 10:52   DG Abd Portable 1V  Result Date: 05/27/2019 CLINICAL DATA:  Feeding tube placement EXAM: PORTABLE ABDOMEN - 1 VIEW COMPARISON:  Abdominal radiograph obtained earlier in the day FINDINGS: Nasogastric tube has been removed. Feeding tube now  present with tip in proximal jejunum. There is paucity of bowel gas. No bowel dilatation or free air evident. There is consolidation in the left lower lobe as well as more patchy infiltrate in the right base. IMPRESSION: 1.  Feeding tube tip in proximal jejunum. 2. Paucity of bowel gas. This finding raises concern for a degree of ileus or enteritis. Bowel obstruction less likely. No free air. 3. Consolidation throughout the left lower lobe with more patchy infiltrate left base. Small left pleural effusion cannot be excluded. Electronically Signed   By: Lowella Grip III M.D.   On: 05/27/2019 13:49    Cardiac Studies    Cardiac cath did not reveal significant coronary obstruction  Decreased LV systolic function with elevated filling pressures consistent with acute on chronic combined systolic and diastolic heart failure.  Patient Profile     59 y.o. male with a historyof hypertension, hyperlipidemia, asthma, arthritis, anxiety/depression,alcohol abuse, prior cocaine abuse, hypertension, debility due to osteoarthritis, and recently diagnosed nonischemic cardiomyopathy with LVEF less than 35%.  In hospital V. fib cardiac arrest precipitated by R on T.  Prolonged CPR after relatively prolonged downtime (greater than 10 minutes). . Assessment & Plan     Ventricular fibrillation cardiac arrest (R on T phenomenon):  -  No VT on tele. Frequent PVCs. Amio back on board. Continue amiodarone.  Chronic combined systolic and diastolic heart failure:  -  Consider heart failure therapy if he becomes hemodynamically stable, currently attempting diuresis, urine output improved. CVP 10 per nursing.  Respiratory failure, hypoxic: Being managed by critical care.  intubated  Acute kidney injury: creatinine plateau and possibly opening up with UOP of 4L  Labile hemodynamics: continues on norepi, now on dobutamine.   Cardiology will continue to follow as needed, PCCM addressing goals of care given overall  poor prognosis.   For questions or updates, please contact [place] Please consult [email or web address] for contact info under        Signed, Elouise Munroe, MD  05/29/2019, 10:26 AM

## 2019-05-29 NOTE — Progress Notes (Signed)
LTM EEG hooked up and running - no initial skin breakdown - push button tested - neuro notified.  

## 2019-05-29 NOTE — Progress Notes (Signed)
PHARMACY NOTE:  ANTIMICROBIAL RENAL DOSAGE ADJUSTMENT  Current antimicrobial regimen includes a mismatch between antimicrobial dosage and estimated renal function.  As per policy approved by the Pharmacy & Therapeutics and Medical Executive Committees, the antimicrobial dosage will be adjusted accordingly.  Current antimicrobial dosage:  Cefepime 2g IV q24h  Indication: PNA  Renal Function:  Estimated Creatinine Clearance: 25 mL/min (A) (by C-G formula based on SCr of 4.35 mg/dL (H)). []      On intermittent HD, scheduled: []      On CRRT    Antimicrobial dosage has been changed to:  Cefepime 1g IV q24h for now with ongoing seizures    Thank you for allowing pharmacy to be a part of this patient's care.  Arrie Senate, PharmD, BCPS Clinical Pharmacist [phone] Please check AMION for all [facility] numbers 05/29/2019

## 2019-05-29 NOTE — Progress Notes (Signed)
Request was made to do camera visit with son, Johandry Longden @ [phone]. Multiple attempts were made to connect video without any response from the number. Attempted to call the number on the telephone and help them through video chat process, Phone went straight to voicemail. If we hear from him, I will be glad to attempt video chat later.

## 2019-05-30 ENCOUNTER — Inpatient Hospital Stay (HOSPITAL_COMMUNITY): Payer: Medicare Other

## 2019-05-30 DIAGNOSIS — Z978 Presence of other specified devices: Secondary | ICD-10-CM

## 2019-05-30 DIAGNOSIS — Z515 Encounter for palliative care: Secondary | ICD-10-CM

## 2019-05-30 DIAGNOSIS — Z7189 Other specified counseling: Secondary | ICD-10-CM

## 2019-05-30 LAB — BASIC METABOLIC PANEL
Anion gap: 14 (ref 5–15)
BUN: 79 mg/dL — ABNORMAL HIGH (ref 6–20)
CO2: 20 mmol/L — ABNORMAL LOW (ref 22–32)
Calcium: 8.3 mg/dL — ABNORMAL LOW (ref 8.9–10.3)
Chloride: 104 mmol/L (ref 98–111)
Creatinine, Ser: 4.14 mg/dL — ABNORMAL HIGH (ref 0.61–1.24)
GFR calc Af Amer: 17 mL/min — ABNORMAL LOW (ref >60)
GFR calc non Af Amer: 15 mL/min — ABNORMAL LOW (ref >60)
Glucose, Bld: 131 mg/dL — ABNORMAL HIGH (ref 70–99)
Potassium: 3 mmol/L — ABNORMAL LOW (ref 3.5–5.1)
Sodium: 138 mmol/L (ref 135–145)

## 2019-05-30 LAB — HEPATIC FUNCTION PANEL
ALT: 57 U/L — ABNORMAL HIGH (ref 0–44)
AST: 101 U/L — ABNORMAL HIGH (ref 15–41)
Albumin: 1.8 g/dL — ABNORMAL LOW (ref 3.5–5.0)
Alkaline Phosphatase: 78 U/L (ref 38–126)
Bilirubin, Direct: <0.1 mg/dL (ref 0.0–0.2)
Total Bilirubin: 0.6 mg/dL (ref 0.3–1.2)
Total Protein: 6.1 g/dL — ABNORMAL LOW (ref 6.5–8.1)

## 2019-05-30 LAB — CBC WITH DIFFERENTIAL/PLATELET
Abs Immature Granulocytes: 0.41 10*3/uL — ABNORMAL HIGH (ref 0.00–0.07)
Basophils Absolute: 0 10*3/uL (ref 0.0–0.1)
Basophils Relative: 0 %
Eosinophils Absolute: 0.1 10*3/uL (ref 0.0–0.5)
Eosinophils Relative: 1 %
HCT: 31.5 % — ABNORMAL LOW (ref 39.0–52.0)
Hemoglobin: 9.9 g/dL — ABNORMAL LOW (ref 13.0–17.0)
Immature Granulocytes: 5 %
Lymphocytes Relative: 16 %
Lymphs Abs: 1.4 10*3/uL (ref 0.7–4.0)
MCH: 22.8 pg — ABNORMAL LOW (ref 26.0–34.0)
MCHC: 31.4 g/dL (ref 30.0–36.0)
MCV: 72.6 fL — ABNORMAL LOW (ref 80.0–100.0)
Monocytes Absolute: 0.8 10*3/uL (ref 0.1–1.0)
Monocytes Relative: 9 %
Neutro Abs: 6.4 10*3/uL (ref 1.7–7.7)
Neutrophils Relative %: 69 %
Platelets: 211 10*3/uL (ref 150–400)
RBC: 4.34 MIL/uL (ref 4.22–5.81)
RDW: 16.4 % — ABNORMAL HIGH (ref 11.5–15.5)
WBC: 9.1 10*3/uL (ref 4.0–10.5)
nRBC: 0.4 % — ABNORMAL HIGH (ref 0.0–0.2)

## 2019-05-30 LAB — GLUCOSE, CAPILLARY
Glucose-Capillary: 107 mg/dL — ABNORMAL HIGH (ref 70–99)
Glucose-Capillary: 119 mg/dL — ABNORMAL HIGH (ref 70–99)
Glucose-Capillary: 121 mg/dL — ABNORMAL HIGH (ref 70–99)
Glucose-Capillary: 122 mg/dL — ABNORMAL HIGH (ref 70–99)
Glucose-Capillary: 128 mg/dL — ABNORMAL HIGH (ref 70–99)
Glucose-Capillary: 133 mg/dL — ABNORMAL HIGH (ref 70–99)
Glucose-Capillary: 149 mg/dL — ABNORMAL HIGH (ref 70–99)

## 2019-05-30 LAB — MAGNESIUM: Magnesium: 2 mg/dL (ref 1.7–2.4)

## 2019-05-30 MED ORDER — POTASSIUM CHLORIDE 20 MEQ/15ML (10%) PO SOLN
40.0000 meq | Freq: Once | ORAL | Status: AC
  Administered 2019-05-30: 40 meq
  Filled 2019-05-30: qty 30

## 2019-05-30 MED ORDER — CHLORHEXIDINE GLUCONATE 0.12 % MT SOLN
OROMUCOSAL | Status: AC
  Filled 2019-05-30: qty 15

## 2019-05-30 NOTE — Progress Notes (Signed)
LTM maint complete - no skin breakdown under: [id number]

## 2019-05-30 NOTE — Progress Notes (Signed)
[facility] Progress Note Patient Name: Xavier Doyle DOB: Sep 25, 1960 MRN: [id number]   Date of Service  05/30/2019  HPI/Events of Note  Hypokalemia - K+ = 3.0 and Creatinine = 4.14. Patient is on a Lasix IV infusion.   eICU Interventions  Will replace K+.       Intervention Category Major Interventions: Electrolyte abnormality - evaluation and management  Vaughan Garfinkle Eugene 05/30/2019, 5:52 AM

## 2019-05-30 NOTE — Consult Note (Signed)
Consultation Note Date: 05/30/2019   Patient Name: Xavier Doyle  DOB: 11-01-1960  MRN: [id number]  Age / Sex: 59 y.o., male  PCP: System, Pcp Not In Referring Physician: Kipp Brood, MD  Reason for Consultation: Establishing goals of care and Psychosocial/spiritual support  HPI/Patient Profile: 59 y.o. male   admitted on 05/20/2019 with PMH significant for  hypertension, hyperlipidemia, asthma, depression who was admitted on 05/10/2019 for chest pain.    Patient was found to have newly diagnosed systolic heart failure with EF of 25-30% with global hypokinesis.  Left and right heart catheterization did not show any ischemic cardiac disease.  Patient's chest pain was thought to be due to hypertensive endorgan damage.  During hospitalization patient had a cardiac arrest.  He was found to be in torsades which converted into PEA.    Patient remains intubated, now with myoclonus and overall poor prognosis.  Family face treatment option decisions, advanced directive decisions and anticipatory care needs.     Clinical Assessment and Goals of Care:  This NP Wadie Lessen reviewed medical records, received report from team, assessed the patient and then participated in a video chat with the patient's son Xavier Doyle to discuss diagnosis, prognosis, GOC, EOL wishes disposition and options.  Dr. Lynetta Mare updated the patient's son in detail regarding current medical situation.   One  hour after that meeting I was able to speak to the patient's son by telephone to introduce the role of palliative medicine into a holistic treatment plan.  A detailed discussion was had today regarding advanced directives.  Concepts specific to code status, artifical feeding and hydration, continued IV antibiotics and rehospitalization was had.  The difference between a aggressive medical intervention path  and a palliative comfort care  path for this patient at this time was had.  Values and goals of care important to patient and family were attempted to be elicited.  I discussed with Xavier Doyle the limitations of medical interventions to prolong quality of life when the body begins to fail to thrive.  I prepared him that anything could happen at any time, that his father is that ill and fragile.  I encouraged him to consider putting limits on any escalation of medical interventions, hoping to focus on comfort and dignity for the patient.  At this time the son wants all offered and available medical interventions to prolong life "until I can get there"  Family's questions and concerns were addressed.  Created space and opportunity for Xavier Doyle to explore his thoughts and feelings regarding his father's current medical situation.  He verbalizes situation is further complicated by family dynamics.  Patient's son lives in [place] and had been some what "out of touch" for the past few months.  He speaks to being estranged from "that side of the family",  he was not listed as a contact and there were several other family members listed who had been receiving medical information over the last several days.  Patient's son Xavier Doyle tells me that they/listed contacts  do not "have his best interest at heart" and he has now blocked any further information to those family members until he can get here in person from [place]  He plans to arrive in [place] hopefully Tuesday or Wednesday.   Son is encouraged to call with questions or concerns.    PMT will continue to support holistically.    No documented healthcare power of attorney at this time.  The patient's son is his next of kin.  There are no other children.  I am told that the patient does have an elderly mother who has end-stage dementia    SUMMARY OF RECOMMENDATIONS   Code Status/Advance Care Planning:  Full code   I encouraged the son to consider DNR/DNI  understanding poor outcomes in similar patients   Additional Recommendations (Limitations, Scope, Preferences):  Full Scope Treatment  Psycho-social/Spiritual:   Desire for further Chaplaincy support:yes  Additional Recommendations: Grief/Bereavement Support   PMT will continue to support holistially  Prognosis:   < 2 weeks  Discharge Planning: Anticipated Hospital Death      Primary Diagnoses: Present on Admission: . Chest pain   I have reviewed the medical record, interviewed the patient and family, and examined the patient. The following aspects are pertinent.  Past Medical History:  Diagnosis Date  . Allergy   . Anxiety   . Arthritis    both knees and ankles, lower back   . Asthma   . Depression   . Heart murmur   . Hyperlipidemia   . Hypertension   . Osteoporosis    Social History   Socioeconomic History  . Marital status: Single    Spouse name: Not on file  . Number of children: Not on file  . Years of education: Not on file  . Highest education level: Not on file  Occupational History  . Not on file  Tobacco Use  . Smoking status: Former Research scientist (life sciences)  . Smokeless tobacco: Never Used  . Tobacco comment: patient reports smoking infrequently   Substance and Sexual Activity  . Alcohol use: Yes    Comment: 1 quart of moonshine per week   . Drug use: Yes    Types: "Crack" cocaine    Comment: denies current use, used "several years ago"  . Sexual activity: Not on file  Other Topics Concern  . Not on file  Social History Narrative  . Not on file   Social Determinants of Health   Financial Resource Strain:   . Difficulty of Paying Living Expenses: Not on file  Food Insecurity:   . Worried About Charity fundraiser in the Last Year: Not on file  . Ran Out of Food in the Last Year: Not on file  Transportation Needs:   . Lack of Transportation (Medical): Not on file  . Lack of Transportation (Non-Medical): Not on file  Physical Activity:   . Days  of Exercise per Week: Not on file  . Minutes of Exercise per Session: Not on file  Stress:   . Feeling of Stress : Not on file  Social Connections:   . Frequency of Communication with Friends and Family: Not on file  . Frequency of Social Gatherings with Friends and Family: Not on file  . Attends Religious Services: Not on file  . Active Member of Clubs or Organizations: Not on file  . Attends Archivist Meetings: Not on file  . Marital Status: Not on file   Family History  Problem Relation Age of  Onset  . Colon cancer Neg Hx   . Colon polyps Neg Hx   . Esophageal cancer Neg Hx   . Rectal cancer Neg Hx   . Stomach cancer Neg Hx    Scheduled Meds: . artificial tears  1 application Both Eyes A6T  . aspirin  81 mg Per Tube Daily  . atorvastatin  80 mg Per Tube q1800  . chlorhexidine gluconate (MEDLINE KIT)  15 mL Mouth Rinse BID  . Chlorhexidine Gluconate Cloth  6 each Topical Daily  . clonazepam  0.25 mg Per Tube BID  . feeding supplement (PRO-STAT SUGAR FREE 64)  60 mL Per Tube BID  . heparin  5,000 Units Subcutaneous Q8H  . insulin detemir  5 Units Subcutaneous Daily  . mouth rinse  15 mL Mouth Rinse 10 times per day  . metolazone  10 mg Oral BID  . oxyCODONE  5 mg Per Tube Q6H  . pantoprazole (PROTONIX) IV  40 mg Intravenous Q24H  . QUEtiapine  25 mg Per Tube Daily  . valproic acid  500 mg Per Tube BID   Continuous Infusions: . amiodarone 30 mg/hr (05/30/19 0700)  . ceFEPime (MAXIPIME) IV Stopped (05/30/19 0617)  . DOBUTamine 2.5 mcg/kg/min (05/30/19 0700)  . feeding supplement (VITAL AF 1.2 CAL) 45 mL/hr at 05/29/19 2000  . fentaNYL infusion INTRAVENOUS 300 mcg/hr (05/30/19 0757)  . furosemide (LASIX) infusion 10 mg/hr (05/30/19 0700)  . levETIRAcetam Stopped (05/30/19 0502)  . midazolam 15 mg/hr (05/30/19 0917)  . norepinephrine (LEVOPHED) Adult infusion 3 mcg/min (05/30/19 0700)  . sodium chloride     PRN Meds:.acetaminophen, albuterol, fentaNYL,  ipratropium-albuterol, midazolam, nitroGLYCERIN, ondansetron (ZOFRAN) IV, vecuronium Medications Prior to Admission:  Prior to Admission medications   Medication Sig Start Date End Date Taking? Authorizing Provider  furosemide (LASIX) 40 MG tablet Take 40 mg by mouth daily as needed for fluid. 04/20/19  Yes [provider]  losartan-hydrochlorothiazide (HYZAAR) 100-25 MG tablet Take 1 tablet by mouth daily.  05/12/18  Yes [provider]  OVER THE COUNTER MEDICATION Neutragenix daily   Yes [provider]  OVER THE COUNTER MEDICATION Take 1 tablet by mouth daily. Keto diet pill    Yes [provider]  PROAIR HFA 108 (90 Base) MCG/ACT inhaler Inhale 2 puffs into the lungs every 6 (six) hours as needed for wheezing.  05/12/18  Yes [provider]  tamsulosin (FLOMAX) 0.4 MG CAPS capsule Take 0.4 mg by mouth.   Yes [provider]   No Known Allergies Review of Systems  Unable to perform ROS: Intubated    Physical Exam Constitutional:      Appearance: He is normal weight.     Interventions: He is intubated.  Cardiovascular:     Rate and Rhythm: Normal rate.  Pulmonary:     Effort: He is intubated.  Skin:    General: Skin is warm and dry.     Vital Signs: BP 99/63   Pulse 98   Temp (!) 97.3 F (36.3 C)   Resp (!) 30   Ht _0  (1.727 m)   Wt 135.5 kg   SpO2 98%   BMI 45.42 kg/m  Pain Scale: CPOT   Pain Score: 0-No pain   SpO2: SpO2: 98 % O2 Device:SpO2: 98 % O2 Flow Rate: .   IO: Intake/output summary:   Intake/Output Summary (Last 24 hours) at 05/30/2019 0938 Last data filed at 05/30/2019 0700 Gross per 24 hour  Intake 3474.92 ml  Output  5600 ml  Net -2125.08 ml    LBM: Last BM Date: 05/29/19 Baseline Weight: Weight: 124.7 kg Most recent weight: Weight: 135.5 kg     Palliative Assessment/Data:   Discussed with Dr Lynetta Mare and beside RN/Tanya  Time In: 0800 Time Out: 0915 Time Total: 75 minutes Greater  than 50%  of this time was spent counseling and coordinating care related to the above assessment and plan.  Signed by: Wadie Lessen, NP   Please contact Palliative Medicine Team phone at [phone] for questions and concerns.  For individual provider: See Shea Evans

## 2019-05-30 NOTE — Progress Notes (Signed)
PULMONARY / CRITICAL CARE MEDICINE   NAME:  Xavier Doyle, MRN:  [id number], DOB:  Feb 08, 1961, LOS: 6 ADMISSION DATE:  06/04/2019, CONSULTATION DATE: 05/24/2019 REFERRING MD: Family medicine teaching service, CHIEF COMPLAINT: Cardiac arrest  BRIEF HISTORY:    [name] is a 59 year old male with hypertension, hyperlipidemia, asthma, depression who was admitted on 05/06/2019 for chest pain.  Patient was found to have newly diagnosed systolic heart failure with EF of 25-30% with global hypokinesis.  Left and right heart catheterization did not show any ischemic cardiac disease.  Patient's chest pain was thought to be due to hypertensive endorgan damage.  During hospitalization patient had a cardiac arrest.  He was found to be in torsades which converted into PEA.  Patient received epi x3, amiodarone, bicarb x2, defibrillation.  ROSC was obtained and patient was started on TTM along with dopamine.   SIGNIFICANT PAST MEDICAL HISTORY    Allergy, Anxiety, Arthritis, Asthma, Depression, Heart murmur, Hyperlipidemia, Hypertension, and Osteoporosis.  SIGNIFICANT EVENTS:   1/17 admitted to [facility] for chest pain 1/19 V. fib arrest subsequent to torsades, TTM initiated 1/20 Repeat TTE without new changes  1/21 EEG stopped, Aggressive diuresis with lasix drip and metolazone  1/23 Completed TTM 1/24 Myoclonic activity noted started on versed and placed on eeg  STUDIES:    TTE 05/23/2019 Left ventricular ejection fraction, by visual estimation, is 25 to 30%. The left ventricle has severely decreased function. There is moderately increased left ventricular hypertrophy. 2. Left ventricular diastolic parameters are consistent with Grade I diastolic dysfunction (impaired relaxation). 3. The left ventricle demonstrates global hypokinesis. 4. Global right ventricle has mildly reduced systolic function.The right ventricular size is normal. No increase in right ventricular wall thickness. 5. Left atrial size was  mildly dilated. 6. Right atrial size was mildly dilated. 7. The mitral valve is normal in structure. Mild mitral valve regurgitation. No evidence of mitral stenosis. 8. The tricuspid valve is normal in structure. Tricuspid valve regurgitation is not demonstrated. 9. The aortic valve is tricuspid. Aortic valve regurgitation is mild. Mild aortic valve sclerosis without stenosis. 10. The inferior vena cava is normal in size with greater than 50% respiratory variability, suggesting right atrial pressure of 3 mmHg. 11. TR signal is inadequate for assessing pulmonary artery systolic pressure.  TTE 05/24/2018  Left ventricular ejection fraction, by visual estimation, is 25 to 30%. The left ventricle has  severely decreased function. There is mildly increased left ventricular hypertrophy. 2. Left ventricular diastolic parameters are consistent with Grade I diastolic dysfunction (impaired relaxation). 3. The left ventricle demonstrates global hypokinesis. 4. Global right ventricle has moderately reduced systolic function.The right ventricular size is normal. No increase in right ventricular wall thickness. 5. Left atrial size was normal. 6. Right atrial size was normal. 7. The mitral valve is normal in structure. Trivial mitral valve regurgitation. No evidence of mitral stenosis. 8. The tricuspid valve is normal in structure. 9. The tricuspid valve is normal in structure. Tricuspid valve regurgitation is trivial. 10. The aortic valve is tricuspid. Aortic valve regurgitation is trivial. No evidence of aortic valve sclerosis or stenosis. 11. The pulmonic valve was normal in structure. Pulmonic valve regurgitation is not visualized. 12. Moderately elevated pulmonary artery systolic pressure. 13. The tricuspid regurgitant velocity is 2.73 m/s, and with an assumed right atrial pressure of 15 mmHg, the estimated right ventricular systolic pressure is moderately elevated at 44.8 mmHg. 14. The inferior vena  cava is dilated in size with <50% respiratory variability, suggesting right atrial  pressure of 15 mmHg. 15. No significant change from prior study.  Right/left heart cath 05/07/2019  Mid RCA lesion is 10% stenosed. No significant CAD.  LV end diastolic pressure is moderately elevated. LVEDP 26 mm Hg.  There is no aortic valve stenosis.  Hemodynamic findings consistent with mild pulmonary hypertension.  Ao sat 96%, PA sat 67%, mean PA pressure 29 mm Hg; mean PCWP 20; CO 6.3 L/min, CI 2.72   Medical therapy for nonischemic cardiomyopathy.  CULTURES:   Covid negative Blood culture 1/21 no growth< 24hrs in 2 bottles Respiratory culture 1/22 pending  ANTIBIOTICS:  Vancomycin 1/21>1/22 Cefepime 1/21>  LINES/TUBES:  ETT 1/19 L CVC 1/19  CONSULTANTS:  PCCM Cardiology Palliative care   SUBJECTIVE:  Patient cannot provide due to encephalopathy.   CONSTITUTIONAL: BP 111/72   Pulse 80   Temp (!) 97.3 F (36.3 C)   Resp (!) 30   Ht 5\' 8"  (1.727 m)   Wt 135.5 kg   SpO2 97%   BMI 45.42 kg/m   I/O last 3 completed shifts: In: 5179.3 [I.V.:2949.2; NG/GT:1620; IV Piggyback:610.2] Out: [id number]; Stool:500]  CVP:  [7 mmHg-12 mmHg] 12 mmHg  Vent Mode: PRVC FiO2 (%):  [50 %] 50 % Set Rate:  [30 bmp-33 bmp] 30 bmp Vt Set:  [540 mL] 540 mL PEEP:  [10 cmH20] 10 cmH20 Plateau Pressure:  [17 cmH20-30 cmH20] 26 cmH20  PHYSIAM: General:  Sedated, appears in no acute distress Neuro: sluggish pupillary response, absent corneal, myoclonic appearing jerks on right foot HEENT:diaphoretic, notably around eyes, eeg leads placed Cardiovascular: rrr, s1 and s2 audible Lungs: no significant rales or rhonchi Abdomen: soft, protuberant abdomen Musculoskeletal: no pitting edema Skin: warm extremities  ASSESSMENT AND PLAN    Cardiogenic shock secondary to V. fib arrest on 05/30/2019 Patient continues to be on levophed and dopamine. Patient is to complete TTM protocol  1/23.  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 There was no concomitant eeg change at time of whole body twitching episode. Per Dr. Hortense Ramal these episodes seem to be triggered by stimulation.   -titrate versed  -titrate valproate  -continue eeg monitoring   Acute oliguric renal failure Cardiorenal syndrome.  Patient creatinine slightly improved 4.35>4.14. Patient thought to not be a good candidate for crrt.   -placed stat palliative care consult again.   Acute hypoxemic respiratory failure Hospital acquired/Aspiration Pneumonia Thought to be secondary to cardiac arrest and pulmonary edema. Patient put out 5L of urine over the past 24 hrs. Weight remains stable at 298lbs.   Respiratory culture 1/22 growing rare staph aureus, group b strep that is resistant to ciprofloxacin and erythromycin.   -Continue cefepime for HAP day 5/7 -Elevate head of bed -VAP protocol  HFrEF TTE showing EF of 25-30% with g1dd. Patient getting lasix drip and 10mg  metolazone bid and still without good urine output.   -Will likely need ICD for optimal managment  Acute encephalopathy  Multifactorial-hypoxic, metabolic, infectious.   -Continue ventilatory support -keppra for seizure precaution -Continue to do frequent neurological checks -blood culture no growth 2days  -urine culture cancelled for some reason -Respiratory culture growing gram  positive cocci  -ua neg nitrite, leukocytes, wbc 11-20, rare bacteria   Diabetes mellitus   A1C 6.4. Glucose ranging 100-130s.   -continue levemir 10u bid  Best Practice / Goals of Care / Disposition.   DVT PROPHYLAXIS: heparin sq SUP: protonix NUTRITION: npo MOBILITY: bedrest GOALS OF CARE: full FAMILY DISCUSSIONS: updating patient's aunt, complicated family dynamic. Unable to reach son Cashen Kurylo  DISPOSITION ICU  LABS  Glucose Recent Labs  Lab 05/29/19 1031 05/29/19 1302 05/29/19 1657 05/29/19 2023 05/30/19 0004 05/30/19 0411  GLUCAP 100* 135* 116* 124* 133* 119*    BMET Recent Labs  Lab 05/28/19 0454 05/28/19 0753 05/28/19 1634 05/29/19 0328 05/29/19 0344 05/30/19 0401  NA 136   < >  --  138 138 138  K 4.8   < >  --  3.4* 3.4* 3.0*  CL 105  --   --   --  104 104  CO2 19*  --   --   --  20* 20*  BUN 48*  --   --   --  67* 79*  CREATININE 4.52*   < > 4.51*  --  4.35* 4.14*  GLUCOSE 128*  --   --   --  120* 131*   < > = values in this interval not displayed.    Liver Enzymes Recent Labs  Lab 05/25/19 2019 05/26/19 0230 05/30/19 0401  AST 267* 235* 101*  ALT 142* 130* 57*  ALKPHOS 87 86 78  BILITOT 0.4 0.3 0.6  ALBUMIN 2.8* 2.8* 1.8*    Electrolytes Recent Labs  Lab 05/28/19 0454 05/28/19 0454 05/28/19 1634 05/29/19 0344 05/30/19 0401  CALCIUM 7.7*  --   --  7.5* 8.3*  MG 1.9   < > 1.7 1.6* 2.0  PHOS 7.5*  --  5.8* 4.2  --    < > = values in this interval not displayed.    CBC Recent Labs  Lab 05/28/19 0454 05/28/19 0753 05/29/19 0328 05/29/19 0344 05/30/19 0401  WBC 8.8  --   --  9.2 9.1  HGB 11.5*   < > 11.2* 10.0* 9.9*  HCT 40.8   < > 33.0* 33.5* 31.5*  PLT 241  --   --  204 211   < > = values in this interval not displayed.    ABG Recent Labs  Lab 05/28/19 0753 05/29/19 0328 05/29/19 1050  PHART 7.266* 7.322* 7.436  PCO2ART 39.2 38.9 26.7*  PO2ART 162.0* 148.0* 94.7    Coag's Recent Labs  Lab 05/10/2019 1733 05/25/19 0455  APTT 27 28  INR 1.1 1.0    Sepsis Markers No results for input(s):  LATICACIDVEN, PROCALCITON, O2SATVEN in the last 168 hours.  Cardiac Enzymes No results for input(s): TROPONINI, PROBNP in the last 168 hours.  REVIEW OF SYSTEMS:    Patient is intubated and sedated

## 2019-05-30 NOTE — Progress Notes (Signed)
Teleconference via [place] w/Dr. Harland German Dr. Maricela Bo and Winfred Burn speaking with son Davinder. Questions and concerns answered and addressed.  Palliative team to follow-up this afternoon per son's request.

## 2019-05-30 NOTE — Procedures (Addendum)
Patient Name:Charls Nyoka Cowden [id number] Epilepsy Attending:Emersynn Deatley [name] Referring Physician/Provider:Dr. Dyann Ruddle  Duration:05/29/2019 [id number] to 05/30/2019 1036  Patient history:58yo s/p cardiac arrest now on TTM. EEG to evaluate for seizure  Level of alertness:comatose  AEDs during EEG study:Keppra, valproate, Versed, clonazepam  Technical aspects: This EEG study was done with scalp electrodes positioned according to the 10-20 International system of electrode placement. Electrical activity was acquired at a sampling rate of 500Hz  and reviewed with a high frequency filter of 70Hz  and a low frequency filter of 1Hz . EEG data were recorded continuously and digitally stored.  DESCRIPTION: EEG showed continuous generalized background suppression. EEG was reactive to tactile stimulation. Hyperventilation and photic stimulation were not performed.  EKG artifact was seen throughout the study.  Event button was pressed multiple times during the study (On 05/29/2019 1701 and 2026, on 05/30/2019 at 739, 0800 and 0854). On video, patient does appear to have whole-body twitching or right or left side twitching. Concomitant EEG before during and after the event and does not show any EEG change to suggest seizure.  ABNORMALITY -Backgroundsuppression, generalized   IMPRESSION: This study isshowed evidence of profound diffuse encephalopathy, non specific to etiology. No seizures ordefiniteepileptiform discharges were seen throughout the recording.  Event button was pressed multiple times as described above for whole body twitching and left or right leg twitching without concomitant EEG change and is therefore not epileptic. Of note, it appears that these episodes may be triggered by stimulation and given the semiology of the events could be stimulation induced myoclonus or subcortical myoclonus.  Gavon Majano [name]

## 2019-05-30 NOTE — Progress Notes (Signed)
Assisted tele visit to patient with family member.  Latricia Cerrito Samson, RN  

## 2019-05-30 NOTE — Progress Notes (Signed)
LTM EEG discontinued - no skin breakdown at unhook.   

## 2019-05-30 NOTE — Progress Notes (Signed)
Assisted tele visit to patient with son and Dr Lynetta Mare .  Maryelizabeth Rowan, RN

## 2019-05-30 NOTE — Progress Notes (Signed)
Progress Note  Patient Name: Xavier Doyle: 05/30/2019  Primary Cardiologist: Belva Crome III, MD   Subjective   Intubated, cvp 10, making some urine output. On amiodarone, Occ PVCs  Inpatient Medications    Scheduled Meds: . artificial tears  1 application Both Eyes W8G  . aspirin  81 mg Per Tube Daily  . atorvastatin  80 mg Per Tube q1800  . chlorhexidine gluconate (MEDLINE KIT)  15 mL Mouth Rinse BID  . Chlorhexidine Gluconate Cloth  6 each Topical Daily  . clonazepam  0.25 mg Per Tube BID  . feeding supplement (PRO-STAT SUGAR FREE 64)  60 mL Per Tube BID  . heparin  5,000 Units Subcutaneous Q8H  . insulin detemir  5 Units Subcutaneous Daily  . mouth rinse  15 mL Mouth Rinse 10 times per day  . metolazone  10 mg Oral BID  . oxyCODONE  5 mg Per Tube Q6H  . pantoprazole (PROTONIX) IV  40 mg Intravenous Q24H  . QUEtiapine  25 mg Per Tube Daily  . valproic acid  500 mg Per Tube BID   Continuous Infusions: . amiodarone 30 mg/hr (05/30/19 0700)  . ceFEPime (MAXIPIME) IV Stopped (05/30/19 0617)  . DOBUTamine 2.5 mcg/kg/min (05/30/19 0700)  . feeding supplement (VITAL AF 1.2 CAL) 45 mL/hr at 05/29/19 2000  . fentaNYL infusion INTRAVENOUS 300 mcg/hr (05/30/19 0757)  . furosemide (LASIX) infusion 10 mg/hr (05/30/19 0700)  . levETIRAcetam Stopped (05/30/19 0502)  . midazolam 15 mg/hr (05/30/19 0917)  . norepinephrine (LEVOPHED) Adult infusion 3 mcg/min (05/30/19 0700)  . sodium chloride     PRN Meds: acetaminophen, albuterol, fentaNYL, ipratropium-albuterol, midazolam, nitroGLYCERIN, ondansetron (ZOFRAN) IV, vecuronium   Vital Signs    Vitals:   05/30/19 0600 05/30/19 0630 05/30/19 0700 05/30/19 0743  BP: (!) 95/58 107/72 111/72 99/63  Pulse: 69 89 80 98  Resp: (!) 30 (!) 30 (!) 30   Temp: (!) 97.5 F (36.4 C) (!) 97.3 F (36.3 C) (!) 97.3 F (36.3 C)   TempSrc:      SpO2: 97% 96% 97% 98%  Weight:      Height:        Intake/Output Summary  (Last 24 hours) at 05/30/2019 0927 Last data filed at 05/30/2019 0700 Gross per 24 hour  Intake 3474.92 ml  Output 5600 ml  Net -2125.08 ml   Last 3 Weights 05/30/2019 05/29/2019 05/28/2019  Weight (lbs) 298 lb 11.6 oz 299 lb 2.6 oz 294 lb 5 oz  Weight (kg) 135.5 kg 135.7 kg 133.5 kg      Telemetry    Sinus rhythm, Occ pvcs- personally Reviewed  ECG    Sinus bradycardia, PAC 05/25/2019 at 7:29am.  Normal PR interval-- Personally Reviewed  Physical Exam   GEN: intubated, unresponsive Neck: unable to assess, lines in place Cardiac:  regular rhythm normal rate, no murmurs Respiratory:  ventilator breath sounds GI:   Abdomen distended but remains somewhat soft Neuro:   sedated, intubated, no response other than some myoclonus on right   Labs    High Sensitivity Troponin:   Recent Labs  Lab 05/21/2019 1110 05/25/2019 1251 05/23/19 0728 05/14/2019 1733 05/31/2019 1934  TROPONINIHS 52* 54* 58* 116* 13,071*      Chemistry Recent Labs  Lab 05/25/19 2019 05/25/19 2019 05/26/19 0230 05/26/19 0430 05/28/19 0454 05/28/19 0753 05/28/19 1634 05/29/19 0328 05/29/19 0344 05/30/19 0401  NA 138   < > 139   < > 136   < >  --  138 138 138  K 3.6   < > 3.6   < > 4.8   < >  --  3.4* 3.4* 3.0*  CL 108   < > 108   < > 105  --   --   --  104 104  CO2 19*   < > 19*   < > 19*  --   --   --  20* 20*  GLUCOSE 127*   < > 132*   < > 128*  --   --   --  120* 131*  BUN 27*   < > 24*   < > 48*  --   --   --  67* 79*  CREATININE 1.57*   < > 1.51*   < > 4.52*   < > 4.51*  --  4.35* 4.14*  CALCIUM 7.8*   < > 7.9*   < > 7.7*  --   --   --  7.5* 8.3*  PROT 6.3*  --  6.3*  --   --   --   --   --   --  6.1*  ALBUMIN 2.8*  --  2.8*  --   --   --   --   --   --  1.8*  AST 267*  --  235*  --   --   --   --   --   --  101*  ALT 142*  --  130*  --   --   --   --   --   --  57*  ALKPHOS 87  --  86  --   --   --   --   --   --  78  BILITOT 0.4  --  0.3  --   --   --   --   --   --  0.6  GFRNONAA 48*   < > 50*    < > 13*   < > 13*  --  14* 15*  GFRAA 55*   < > 58*   < > 15*   < > 15*  --  16* 17*  ANIONGAP 11   < > 12   < > 12  --   --   --  14 14   < > = values in this interval not displayed.     Hematology Recent Labs  Lab 05/28/19 0454 05/28/19 0753 05/29/19 0328 05/29/19 0344 05/30/19 0401  WBC 8.8  --   --  9.2 9.1  RBC 4.96  --   --  4.35 4.34  HGB 11.5*   < > 11.2* 10.0* 9.9*  HCT 40.8   < > 33.0* 33.5* 31.5*  MCV 82.3  --   --  77.0* 72.6*  MCH 23.2*  --   --  23.0* 22.8*  MCHC 28.2*  --   --  29.9* 31.4  RDW 17.5*  --   --  17.0* 16.4*  PLT 241  --   --  204 211   < > = values in this interval not displayed.    BNP Recent Labs  Lab 05/27/19 1601  BNP 584.4*     DDimer  No results for input(s): DDIMER in the last 168 hours.   Radiology    DG CHEST PORT 1 VIEW  Result Date: 05/30/2019 CLINICAL DATA:  Ventilator support.  Follow-up. EXAM: PORTABLE CHEST 1 VIEW COMPARISON:  05/28/2019 FINDINGS: Endotracheal tube tip is 4 cm  above the carina. Soft feeding tube enters the abdomen. Left internal jugular central line tip is in the SVC at the azygos level. Right lung shows improvement with better aeration in the lower lobe. Worsened opacity in the left hemithorax with less aerated upper lobe. Findings could be consistent with consolidation and pleural fluid. IMPRESSION: Worsened appearance on the left with less aerated lung. Consolidation and possible pleural fluid. Improved aeration of the right lung base. Electronically Signed   By: Nelson Chimes M.D.   On: 05/30/2019 09:20   EEG LTVM - Continuous Bedside W/ Video Includes Portable EEG Read  Result Date: 05/30/2019 Lora Havens, MD     05/30/2019  8:53 AM Patient Name:Kiyoshi Nyoka Cowden [id number] Epilepsy Attending:Priyanka [name] Referring Physician/Provider:Dr. Dyann Ruddle Duration:05/29/2019 1529 to 05/30/2019  0845  Patient history:59yo s/p cardiac arrest now on TTM. EEG to evaluate for seizure  Level of  alertness:comatose  AEDs during EEG study:Keppra, valproate, Versed, clonazepam  Technical aspects: This EEG study was done with scalp electrodes positioned according to the 10-20 International system of electrode placement. Electrical activity was acquired at a sampling rate of '500Hz'$  and reviewed with a high frequency filter of '70Hz'$  and a low frequency filter of '1Hz'$ . EEG data were recorded continuously and digitally stored.  DESCRIPTION: EEG showed continuous generalized background suppression. EEG was reactive to tactile stimulation. Hyperventilation and photic stimulation were not performed.  EKG artifact was seen throughout the study. Event button was pressed multiple times during the study (On 05/29/2019 1701 and 2026, on 05/30/2019 at 739 and 0800).  On video, patient does appear to have whole-body twitching or right or left side twitching.  Concomitant EEG before during and after the event and does not show any EEG change to suggest seizure.  ABNORMALITY -Backgroundsuppression, generalized   IMPRESSION: This study isshowed evidence of profound diffuse encephalopathy, non specific to etiology. No seizures ordefiniteepileptiform discharges were seen throughout the recording. Event button was pressed multiple times as described above for whole body twitching and left or right leg twitching without concomitant EEG change and is therefore not epileptic.  Of note, it appears that these episodes may be triggered by stimulation and given the semiology of the events could be stimulation induced myoclonus or subcortical myoclonus.  Lora Havens   Cardiac Studies    Cardiac cath did not reveal significant coronary obstruction  Decreased LV systolic function with elevated filling pressures consistent with acute on chronic combined systolic and diastolic heart failure.  Patient Profile     60 y.o. male with a historyof hypertension, hyperlipidemia, asthma, arthritis,  anxiety/depression,alcohol abuse, prior cocaine abuse, hypertension, debility due to osteoarthritis, and recently diagnosed nonischemic cardiomyopathy with LVEF less than 35%.  In hospital V. fib cardiac arrest precipitated by R on T.  Prolonged CPR after relatively prolonged downtime (greater than 10 minutes). . Assessment & Plan     1. Ventricular fibrillation cardiac arrest (R on T phenomenon):  -  No VT on tele. PVCs. Amio IV. Continue amiodarone.  2. Chronic combined systolic and diastolic heart failure:  -  On IV dobutamine and Levophed. Excellent urine output yesterday. Negative 2117. Still volume overloaded. Weight up 23 lbs since admit.   3. Respiratory failure, hypoxic: Being managed by critical care.  intubated  4. Acute kidney injury: creatinine peaked at 4.5. now 4.14 today with good urine output.   5. Labile hemodynamics: continues on norepi,  dobutamine.   6. Anoxic encephalopathy. Completed cooling protocol but still unresponsive. Outcome is  guarded. Palliative care discussions in the works.   For questions or updates, please contact [facility] Please consult [email or web address] for contact info under        Signed, Tangi Shroff [place], MD  05/30/2019, 9:27 AM

## 2019-05-30 NOTE — Progress Notes (Signed)
FPTS Social Progress Note   FPTS following along socially. We appreciate the care provided by CCM and FPTS will resume care when the patient is appriopriate for transfer out to floor.   [name], DO PGY-1, [facility] 05/30/2019 7:18 AM    - Please contact intern pager [phone] as needed

## 2019-05-31 ENCOUNTER — Inpatient Hospital Stay (HOSPITAL_COMMUNITY): Payer: Medicare Other

## 2019-05-31 DIAGNOSIS — J9611 Chronic respiratory failure with hypoxia: Secondary | ICD-10-CM

## 2019-05-31 DIAGNOSIS — M7989 Other specified soft tissue disorders: Secondary | ICD-10-CM

## 2019-05-31 LAB — CULTURE, BLOOD (ROUTINE X 2)
Culture: NO GROWTH
Culture: NO GROWTH

## 2019-05-31 LAB — BASIC METABOLIC PANEL
Anion gap: 15 (ref 5–15)
BUN: 91 mg/dL — ABNORMAL HIGH (ref 6–20)
CO2: 23 mmol/L (ref 22–32)
Calcium: 8.9 mg/dL (ref 8.9–10.3)
Chloride: 102 mmol/L (ref 98–111)
Creatinine, Ser: 3.82 mg/dL — ABNORMAL HIGH (ref 0.61–1.24)
GFR calc Af Amer: 19 mL/min — ABNORMAL LOW (ref >60)
GFR calc non Af Amer: 16 mL/min — ABNORMAL LOW (ref >60)
Glucose, Bld: 122 mg/dL — ABNORMAL HIGH (ref 70–99)
Potassium: 3.2 mmol/L — ABNORMAL LOW (ref 3.5–5.1)
Sodium: 140 mmol/L (ref 135–145)

## 2019-05-31 LAB — GLUCOSE, CAPILLARY
Glucose-Capillary: 105 mg/dL — ABNORMAL HIGH (ref 70–99)
Glucose-Capillary: 109 mg/dL — ABNORMAL HIGH (ref 70–99)
Glucose-Capillary: 115 mg/dL — ABNORMAL HIGH (ref 70–99)
Glucose-Capillary: 125 mg/dL — ABNORMAL HIGH (ref 70–99)
Glucose-Capillary: 126 mg/dL — ABNORMAL HIGH (ref 70–99)
Glucose-Capillary: 141 mg/dL — ABNORMAL HIGH (ref 70–99)

## 2019-05-31 LAB — CBC WITH DIFFERENTIAL/PLATELET
Abs Immature Granulocytes: 0.55 10*3/uL — ABNORMAL HIGH (ref 0.00–0.07)
Basophils Absolute: 0 10*3/uL (ref 0.0–0.1)
Basophils Relative: 0 %
Eosinophils Absolute: 0.1 10*3/uL (ref 0.0–0.5)
Eosinophils Relative: 1 %
HCT: 33.9 % — ABNORMAL LOW (ref 39.0–52.0)
Hemoglobin: 10.8 g/dL — ABNORMAL LOW (ref 13.0–17.0)
Immature Granulocytes: 5 %
Lymphocytes Relative: 12 %
Lymphs Abs: 1.4 10*3/uL (ref 0.7–4.0)
MCH: 22.9 pg — ABNORMAL LOW (ref 26.0–34.0)
MCHC: 31.9 g/dL (ref 30.0–36.0)
MCV: 72 fL — ABNORMAL LOW (ref 80.0–100.0)
Monocytes Absolute: 1.1 10*3/uL — ABNORMAL HIGH (ref 0.1–1.0)
Monocytes Relative: 10 %
Neutro Abs: 7.9 10*3/uL — ABNORMAL HIGH (ref 1.7–7.7)
Neutrophils Relative %: 72 %
Platelets: 227 10*3/uL (ref 150–400)
RBC: 4.71 MIL/uL (ref 4.22–5.81)
RDW: 16.3 % — ABNORMAL HIGH (ref 11.5–15.5)
WBC: 11 10*3/uL — ABNORMAL HIGH (ref 4.0–10.5)
nRBC: 0.2 % (ref 0.0–0.2)

## 2019-05-31 LAB — MAGNESIUM: Magnesium: 1.9 mg/dL (ref 1.7–2.4)

## 2019-05-31 MED ORDER — POTASSIUM CHLORIDE 10 MEQ/50ML IV SOLN
10.0000 meq | INTRAVENOUS | Status: AC
  Administered 2019-05-31 (×3): 10 meq via INTRAVENOUS
  Filled 2019-05-31: qty 50

## 2019-05-31 MED ORDER — METOLAZONE 5 MG PO TABS: 5.0000 mg | ORAL_TABLET | Freq: Two times a day (BID) | ORAL | Status: DC

## 2019-05-31 MED FILL — Fentanyl Citrate Preservative Free (PF) Inj 2500 MCG/50ML: INTRAMUSCULAR | Qty: 50 | Status: AC

## 2019-05-31 MED FILL — Sodium Chloride IV Soln 0.9%: INTRAVENOUS | Qty: 250 | Status: AC

## 2019-05-31 NOTE — Progress Notes (Signed)
Patient ID: Xavier Doyle, male   DOB: 26-Mar-1961, 59 y.o.   MRN: [id number]  This NP visited patient at the bedside as a follow up to  yesterday's [place], for palliative medicine needs and emotional support.  Spoke to [employer] in [place].  Again we discussed the current medical situation; seriousness of multiple co-morbidites and grim prognosis.  Explored his plans for coming to [facility] as discussed before making decisions for his father's EOL care. He tells me " doesn't know at this time", "is trying to work it out with work"  Discussed with son  the importance of pending decisions and the  reccommended for shift to comfort, liberating him from the ventilator and allowing a natural death, ensuring treatment is   within the context of the patients values and GOCs, and best interest.   Questions and concerns addressed  Emotional support offered   Discussed with Dr Lynetta Mare and bedside RN  Total time spent on the unit was 25 minutes     PMT will continue to support holistically  Greater than 50% of the time was spent in counseling and coordination of care  Wadie Lessen NP  Palliative Medicine Team Team Phone # [phone] Pager [phone]

## 2019-05-31 NOTE — Progress Notes (Signed)
PULMONARY / CRITICAL CARE MEDICINE   NAME:  Xavier Doyle, MRN:  [id number], DOB:  03-24-1961, LOS: 7 ADMISSION DATE:  05/16/2019, CONSULTATION DATE: 05/10/2019 REFERRING MD: Family medicine teaching service, CHIEF COMPLAINT: Cardiac arrest  BRIEF HISTORY:    Mr. Bodner is a 59 year old male with hypertension, hyperlipidemia, asthma, depression who was admitted on 05/12/2019 for chest pain.  Patient was found to have newly diagnosed systolic heart failure with EF of 25-30% with global hypokinesis.  Left and right heart catheterization did not show any ischemic cardiac disease.  Patient's chest pain was thought to be due to hypertensive endorgan damage.  During hospitalization patient had a cardiac arrest.  He was found to be in torsades which converted into PEA.  Patient received epi x3, amiodarone, bicarb x2, defibrillation.  ROSC was obtained and patient was started on TTM along with dopamine.   SIGNIFICANT PAST MEDICAL HISTORY    Allergy, Anxiety, Arthritis, Asthma, Depression, Heart murmur, Hyperlipidemia, Hypertension, and Osteoporosis.  SIGNIFICANT EVENTS:   1/17 admitted to [facility] for chest pain 1/19 V. fib arrest subsequent to torsades, TTM initiated 1/20 Repeat TTE without new changes  1/21 EEG stopped, Aggressive diuresis with lasix drip and metolazone  1/23 Completed TTM 1/24 Myoclonic activity noted started on versed and placed on eeg 1/25 spoke with patient's son he mentioned that he would like for all care to be resumed till he comes from [place] (tentatatively 1/27) Palliative care discussion  1/26 Total body myoclonus   STUDIES:    TTE 05/23/2019 Left ventricular ejection fraction, by visual estimation, is 25 to 30%. The left ventricle has severely decreased function. There is moderately increased left ventricular hypertrophy. 2. Left ventricular diastolic parameters are consistent with Grade I diastolic dysfunction (impaired relaxation). 3. The left ventricle  demonstrates global hypokinesis. 4. Global right ventricle has mildly reduced systolic function.The right ventricular size is normal. No increase in right ventricular wall thickness. 5. Left atrial size was mildly dilated. 6. Right atrial size was mildly dilated. 7. The mitral valve is normal in structure. Mild mitral valve regurgitation. No evidence of mitral stenosis. 8. The tricuspid valve is normal in structure. Tricuspid valve regurgitation is not demonstrated. 9. The aortic valve is tricuspid. Aortic valve regurgitation is mild. Mild aortic valve sclerosis without stenosis. 10. The inferior vena cava is normal in size with greater than 50% respiratory variability, suggesting right atrial pressure of 3 mmHg. 11. TR signal is inadequate for assessing pulmonary artery systolic pressure.  TTE 05/24/2018  Left ventricular ejection fraction, by visual estimation, is 25 to 30%. The left ventricle has  severely decreased function. There is mildly increased left ventricular hypertrophy. 2. Left ventricular diastolic parameters are consistent with Grade I diastolic dysfunction (impaired relaxation). 3. The left ventricle demonstrates global hypokinesis. 4. Global right ventricle has moderately reduced systolic function.The right ventricular size is normal. No increase in right ventricular wall thickness. 5. Left atrial size was normal. 6. Right atrial size was normal. 7. The mitral valve is normal in structure. Trivial mitral valve regurgitation. No evidence of mitral stenosis. 8. The tricuspid valve is normal in structure. 9. The tricuspid valve is normal in structure. Tricuspid valve regurgitation is trivial. 10. The aortic valve is tricuspid. Aortic valve regurgitation is trivial. No evidence of aortic valve sclerosis or stenosis. 11. The pulmonic valve was normal in structure. Pulmonic valve regurgitation is not visualized. 12. Moderately elevated pulmonary artery systolic pressure. 13. The  tricuspid regurgitant velocity is 2.73 m/s, and with an assumed right  atrial pressure of 15 mmHg, the estimated right ventricular systolic pressure is moderately elevated at 44.8 mmHg. 14. The inferior vena cava is dilated in size with <50% respiratory variability, suggesting right atrial pressure of 15 mmHg. 15. No significant change from prior study.  Right/left heart cath 05/17/2019  Mid RCA lesion is 10% stenosed. No significant CAD.  LV end diastolic pressure is moderately elevated. LVEDP 26 mm Hg.  There is no aortic valve stenosis.  Hemodynamic findings consistent with mild pulmonary hypertension.  Ao sat 96%, PA sat 67%, mean PA pressure 29 mm Hg; mean PCWP 20; CO 6.3 L/min, CI 2.72   Medical therapy for nonischemic cardiomyopathy.  CULTURES:   Covid negative Blood culture 1/21 no growth< 24hrs in 2 bottles Respiratory culture 1/22 pending  ANTIBIOTICS:  Vancomycin 1/21>1/22 Cefepime 1/21>  LINES/TUBES:  ETT 1/19 L CVC 1/19  CONSULTANTS:  PCCM Cardiology Palliative care   SUBJECTIVE:  Overnight according to RN stated that Mr Glanzman may have aspirated on his tube feeds while being turned althought they were stopped as he was being turned  He was noted to have large amount of secretions and became dyssynchronous with ventilator    CONSTITUTIONAL: BP 110/68   Pulse 84   Temp 98.8 F (37.1 C) (Esophageal)   Resp (!) 30   Ht 5\' 8"  (1.727 m)   Wt (!) 136.2 kg   SpO2 100%   BMI 45.66 kg/m   I/O last 3 completed shifts: In: [id number] [I.V.:2595.3; NG/GT:1575; IV Piggyback:546.7] Out: 9200 [Urine:9200]  CVP:  [6 mmHg-12 mmHg] 7 mmHg  Vent Mode: PRVC FiO2 (%):  [50 %] 50 % Set Rate:  [30 bmp] 30 bmp Vt Set:  [540 mL] 540 mL PEEP:  [10 cmH20] 10 cmH20 Plateau Pressure:  [26 cmH20-29 cmH20] 26 cmH20  PHYSIAM: General: Sedated and intubated Neuro: right eye inverted inward, sluggish pupillary response bilateral upper and lower extremity myoclonus HEENT: et tube  in place, crusted bilateral eyes right eye inverted Cardiovascular: rrr, s1 and s2 audible Lungs: distant breath sounds Abdomen: distended abdomen Musculoskeletal: no pitting edema Skin: warm extremities  ASSESSMENT AND PLAN    Cardiogenic shock secondary to V. fib arrest on 05/10/2019 s/p TTM completion on 1/23 HFrEF (EF 25-30% G1DD)  Patient with pressures ranging 90-120/40-50s over the past 24 hrs Continues to require pressor support with levophed and dobutamine. Patient is to complete TTM protocol 1/23.  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   -titrate versed  -titrate valproate  -continue eeg monitoring   Acute hypoxemic respiratory failure Hospital acquired/Aspiration Pneumonia Thought to be secondary to cardiac arrest and pulmonary edema. Patient is being diuresed. Overnight the patient thought to have aspiration event. Chest xary 1/26 showing more confluent opacity in the left lung base slightly decreased from prior.   Respiratory culture 1/22 growing rare staph  aureus, group b strep that is resistant to ciprofloxacin and erythromycin.   -Continue cefepime for HAP day 6/7 -Elevate head of bed -VAP protocol  Acute oliguric renal failure Cardiorenal  syndrome Patient creatinine slightly improved to 3.82 from 4.14. Patient thought to not be a good candidate for crrt.   -corrected k 3.2 with 81meq kcl  Acute encephalopathy  Multifactorial-hypoxic, metabolic, infectious.   -Continue ventilatory support -keppra for seizure precaution -Continue to do frequent neurological checks -blood culture no growth 2days  -urine culture cancelled for some reason -Respiratory culture growing gram positive cocci  -ua neg nitrite, leukocytes, wbc 11-20, rare bacteria   Diabetes mellitus  A1C 6.4. Glucose ranging 100-140s   -continue levemir 10u bid  Best Practice / Goals of Care / Disposition.   DVT PROPHYLAXIS: heparin sq SUP: protonix NUTRITION: npo MOBILITY: bedrest GOALS OF CARE: full FAMILY DISCUSSIONS: Patient's son Mr. Evern Enwright is being updated DISPOSITION ICU  LABS  Glucose Recent Labs  Lab 05/30/19 1124 05/30/19 1506 05/30/19 1938 05/30/19 2335 05/31/19 0406 05/31/19 0846  GLUCAP 121* 122* 128* 149* 105* 125*    BMET Recent Labs  Lab 05/29/19 0344 05/30/19 0401 05/31/19 0359  NA 138 138 140  K 3.4* 3.0* 3.2*  CL 104 104 102  CO2 20* 20* 23  BUN 67* 79* 91*  CREATININE 4.35* 4.14* 3.82*  GLUCOSE 120* 131* 122*    Liver Enzymes Recent Labs  Lab 05/25/19 2019 05/26/19 0230 05/30/19 0401  AST 267* 235* 101*  ALT 142* 130* 57*  ALKPHOS 87 86 78  BILITOT 0.4 0.3 0.6  ALBUMIN 2.8* 2.8* 1.8*    Electrolytes Recent Labs  Lab 05/28/19 0454 05/28/19 0454 05/28/19 1634 05/29/19 0344 05/30/19 0401 05/31/19 0359  CALCIUM 7.7*   < >  --  7.5* 8.3* 8.9  MG 1.9   < > 1.7 1.6* 2.0 1.9  PHOS 7.5*  --  5.8* 4.2  --   --    < > = values in this interval not displayed.    CBC Recent Labs  Lab 05/29/19 0344 05/30/19 0401 05/31/19 0359  WBC 9.2 9.1 11.0*  HGB 10.0* 9.9* 10.8*  HCT 33.5* 31.5* 33.9*  PLT 204 211 227    ABG Recent Labs  Lab 05/28/19 0753 05/29/19 0328 05/29/19 1050  PHART  7.266* 7.322* 7.436  PCO2ART 39.2 38.9 26.7*  PO2ART 162.0* 148.0* 94.7    Coag's Recent Labs  Lab 05/26/2019 1733 05/25/19 0455  APTT 27 28  INR 1.1 1.0    Sepsis Markers No results for input(s): LATICACIDVEN, PROCALCITON, O2SATVEN in the last 168 hours.  Cardiac Enzymes No results for input(s): TROPONINI, PROBNP in the last 168 hours.  REVIEW OF SYSTEMS:    Patient is intubated and sedated

## 2019-05-31 NOTE — Progress Notes (Signed)
Progress Note  Patient Name: Xavier Doyle Date of Encounter: 05/31/2019  Primary Cardiologist: Belva Crome III, MD   Subjective   Intubated, cvp 7,  urine output good. On amiodarone, Occ PVCs  Inpatient Medications    Scheduled Meds: . aspirin  81 mg Per Tube Daily  . atorvastatin  80 mg Per Tube q1800  . chlorhexidine gluconate (MEDLINE KIT)  15 mL Mouth Rinse BID  . Chlorhexidine Gluconate Cloth  6 each Topical Daily  . clonazepam  0.25 mg Per Tube BID  . feeding supplement (PRO-STAT SUGAR FREE 64)  60 mL Per Tube BID  . heparin  5,000 Units Subcutaneous Q8H  . insulin detemir  5 Units Subcutaneous Daily  . mouth rinse  15 mL Mouth Rinse 10 times per day  . metolazone  10 mg Oral BID  . oxyCODONE  5 mg Per Tube Q6H  . pantoprazole (PROTONIX) IV  40 mg Intravenous Q24H  . QUEtiapine  25 mg Per Tube Daily  . valproic acid  500 mg Per Tube BID   Continuous Infusions: . amiodarone 30 mg/hr (05/31/19 0600)  . ceFEPime (MAXIPIME) IV Stopped (05/31/19 0507)  . DOBUTamine 2.5 mcg/kg/min (05/31/19 0600)  . feeding supplement (VITAL AF 1.2 CAL) 1,000 mL (05/30/19 1700)  . fentaNYL infusion INTRAVENOUS 300 mcg/hr (05/30/19 1709)  . furosemide (LASIX) infusion 10 mg/hr (05/31/19 0600)  . levETIRAcetam Stopped (05/31/19 0413)  . midazolam 15 mg/hr (05/31/19 1660)  . norepinephrine (LEVOPHED) Adult infusion 3 mcg/min (05/31/19 0600)  . potassium chloride 10 mEq (05/31/19 0708)  . sodium chloride     PRN Meds: acetaminophen, albuterol, fentaNYL, ipratropium-albuterol, midazolam, nitroGLYCERIN, ondansetron (ZOFRAN) IV, vecuronium   Vital Signs    Vitals:   05/31/19 0500 05/31/19 0600 05/31/19 0700 05/31/19 0720  BP: (!) 120/103 (!) 97/48 (!) 106/59   Pulse: 91 91 85 91  Resp: (!) 30 (!) 30 (!) 30   Temp: 98.8 F (37.1 C) 98.6 F (37 C) 98.6 F (37 C)   TempSrc:      SpO2: 100% 96% 100% 100%  Weight: (!) 136.2 kg     Height:        Intake/Output Summary (Last 24  hours) at 05/31/2019 0743 Last data filed at 05/31/2019 0600 Gross per 24 hour  Intake 2761.93 ml  Output 6350 ml  Net -3588.07 ml   Last 3 Weights 05/31/2019 05/30/2019 05/29/2019  Weight (lbs) 300 lb 4.3 oz 298 lb 11.6 oz 299 lb 2.6 oz  Weight (kg) 136.2 kg 135.5 kg 135.7 kg      Telemetry    Sinus rhythm, Occ pvcs- personally Reviewed  ECG    None today  Physical Exam   GEN: intubated, unresponsive Neck: unable to assess, lines in place Cardiac:  regular rhythm normal rate, no murmurs Respiratory:  ventilator breath sounds GI:   Abdomen distended but is soft Neuro:   sedated, intubated, no response other than some myoclonus on right   Labs    High Sensitivity Troponin:   Recent Labs  Lab 05/25/2019 1110 06/02/2019 1251 05/23/19 0728 05/10/2019 1733 05/08/2019 1934  TROPONINIHS 52* 54* 58* 116* 13,071*      Chemistry Recent Labs  Lab 05/25/19 2019 05/25/19 2019 05/26/19 0230 05/26/19 0430 05/29/19 0344 05/30/19 0401 05/31/19 0359  NA 138   < > 139   < > 138 138 140  K 3.6   < > 3.6   < > 3.4* 3.0* 3.2*  CL 108   < >  108   < > 104 104 102  CO2 19*   < > 19*   < > 20* 20* 23  GLUCOSE 127*   < > 132*   < > 120* 131* 122*  BUN 27*   < > 24*   < > 67* 79* 91*  CREATININE 1.57*   < > 1.51*   < > 4.35* 4.14* 3.82*  CALCIUM 7.8*   < > 7.9*   < > 7.5* 8.3* 8.9  PROT 6.3*  --  6.3*  --   --  6.1*  --   ALBUMIN 2.8*  --  2.8*  --   --  1.8*  --   AST 267*  --  235*  --   --  101*  --   ALT 142*  --  130*  --   --  57*  --   ALKPHOS 87  --  86  --   --  78  --   BILITOT 0.4  --  0.3  --   --  0.6  --   GFRNONAA 48*   < > 50*   < > 14* 15* 16*  GFRAA 55*   < > 58*   < > 16* 17* 19*  ANIONGAP 11   < > 12   < > _0 < > = values in this interval not displayed.     Hematology Recent Labs  Lab 05/29/19 0344 05/30/19 0401 05/31/19 0359  WBC 9.2 9.1 11.0*  RBC 4.35 4.34 4.71  HGB 10.0* 9.9* 10.8*  HCT 33.5* 31.5* 33.9*  MCV 77.0* 72.6* 72.0*  MCH 23.0*  22.8* 22.9*  MCHC 29.9* 31.4 31.9  RDW 17.0* 16.4* 16.3*  PLT 204 211 227    BNP Recent Labs  Lab 05/27/19 1601  BNP 584.4*     DDimer  No results for input(s): DDIMER in the last 168 hours.   Radiology    DG Chest Port 1 View  Result Date: 05/31/2019 CLINICAL DATA:  Acute respiratory failure EXAM: PORTABLE CHEST 1 VIEW COMPARISON:  Radiograph 05/30/2019 FINDINGS: *Endotracheal tube position in the mid trachea, 4 cm from the carina. *Transesophageal feeding tube tip terminates beyond the GE junction, below the level of imaging. *Left IJ central venous catheter tip terminates at the brachiocephalic-caval confluence. *Telemetry leads overlie the chest. There are patchy bilateral airspace opacities more focal confluent opacity is seen in the left lung base with obscuration of left hemidiaphragm which could reflect a combination of pleural fluid and parenchymal disease. No pneumothorax. Visualized cardiomediastinal contours are similar to prior accounting for differences in technique. No acute osseous or soft tissue abnormality. IMPRESSION: 1. Patchy bilateral airspace opacities are similar to prior. 2. More confluent opacity in the left lung base is slightly decreased from prior. Could reflect a combination of pleural fluid and parenchymal disease. 3. Lines and tubes as above Electronically Signed   By: Lovena Le M.D.   On: 05/31/2019 03:34   DG CHEST PORT 1 VIEW  Result Date: 05/30/2019 CLINICAL DATA:  Ventilator support.  Follow-up. EXAM: PORTABLE CHEST 1 VIEW COMPARISON:  05/28/2019 FINDINGS: Endotracheal tube tip is 4 cm above the carina. Soft feeding tube enters the abdomen. Left internal jugular central line tip is in the SVC at the azygos level. Right lung shows improvement with better aeration in the lower lobe. Worsened opacity in the left hemithorax with less aerated upper lobe. Findings could be consistent with consolidation and pleural fluid. IMPRESSION:  Worsened appearance on the  left with less aerated lung. Consolidation and possible pleural fluid. Improved aeration of the right lung base. Electronically Signed   By: Nelson Chimes M.D.   On: 05/30/2019 09:20   EEG LTVM - Continuous Bedside W/ Video Includes Portable EEG Read  Result Date: 05/30/2019 Lora Havens, MD     05/30/2019  1:52 PM Patient Name:Pardeep Nyoka Cowden POE:[id number] Epilepsy Attending:Priyanka [name] Referring Physician/Provider:Dr. Dyann Ruddle Duration:05/29/2019 1529 to 05/30/2019 1036  Patient history:58yo s/p cardiac arrest now on TTM. EEG to evaluate for seizure  Level of alertness:comatose  AEDs during EEG study:Keppra, valproate, Versed, clonazepam  Technical aspects: This EEG study was done with scalp electrodes positioned according to the 10-20 International system of electrode placement. Electrical activity was acquired at a sampling rate of _0  and reviewed with a high frequency filter of _1  and a low frequency filter of _2 . EEG data were recorded continuously and digitally stored.  DESCRIPTION: EEG showed continuous generalized background suppression. EEG was reactive to tactile stimulation. Hyperventilation and photic stimulation were not performed.  EKG artifact was seen throughout the study. Event button was pressed multiple times during the study (On 05/29/2019 1701 and 2026, on 05/30/2019 at 739, 0800 and 0854). On video, patient does appear to have whole-body twitching or right or left side twitching. Concomitant EEG before during and after the event and does not show any EEG change to suggest seizure.  ABNORMALITY -Backgroundsuppression, generalized   IMPRESSION: This study isshowed evidence of profound diffuse encephalopathy, non specific to etiology. No seizures ordefiniteepileptiform discharges were seen throughout the recording. Event button was pressed multiple times as described above for whole body twitching and left or right leg twitching without concomitant EEG  change and is therefore not epileptic. Of note, it appears that these episodes may be triggered by stimulation and given the semiology of the events could be stimulation induced myoclonus or subcortical myoclonus.  Lora Havens   Cardiac Studies    Cardiac cath did not reveal significant coronary obstruction  Decreased LV systolic function with elevated filling pressures consistent with acute on chronic combined systolic and diastolic heart failure.  Patient Profile     60 y.o. male with a historyof hypertension, hyperlipidemia, asthma, arthritis, anxiety/depression,alcohol abuse, prior cocaine abuse, hypertension, debility due to osteoarthritis, and recently diagnosed nonischemic cardiomyopathy with LVEF less than 35%.  In hospital V. fib cardiac arrest precipitated by R on T.  Prolonged CPR after relatively prolonged downtime (greater than 10 minutes). . Assessment & Plan     1. Ventricular fibrillation cardiac arrest (R on T phenomenon):  -  No VT on tele. PVCs. Amio IV. Continue amiodarone.  2. Chronic combined systolic and diastolic heart failure:  -  On IV dobutamine and Levophed. Excellent urine output yesterday. Negative 3855. Still volume overloaded. Weight up 23 lbs since admit.   3. Respiratory failure, hypoxic: Being managed by critical care.  intubated  4. Acute kidney injury: creatinine peaked at 4.5. now down to 3.82 today with good urine output.   5. Labile hemodynamics: continues on norepi,  dobutamine.   6. Anoxic encephalopathy. Completed cooling protocol but still unresponsive. Outcome is guarded. Palliative care discussions in the works. Awaiting for son to arrive from [place]. Do not anticipate surviving this hospital stay.   For questions or updates, please contact [place] Please consult [email or web address] for contact info under        Signed, Lonney Revak [place], MD  05/31/2019, 7:43 AM

## 2019-05-31 NOTE — Progress Notes (Signed)
Left upper extremity venous duplex complete.  Please see CV Proc tab for preliminary results. Lita Mains- RDMS, RVT 4:40 PM  05/31/2019

## 2019-05-31 NOTE — Progress Notes (Signed)
FPTS Social Progress Note   FPTS following along socially. We appreciate the care provided by CCM. FPTS will resume care when the patient is appriopriate for transfer out to floor.   [name], DO PGY-1, [facility] 05/31/2019 7:54 AM    - Please contact intern pager [phone] as needed

## 2019-05-31 NOTE — Progress Notes (Signed)
Patient found to have a warm, firm, edematous appearing left upper extremity with limited range of motion.   -left upper extremity dvt study  -iv consult to check for infiltration

## 2019-05-31 NOTE — Progress Notes (Signed)
eLink Physician-Brief Progress Note Patient Name: Xavier Doyle DOB: February 21, 1961 MRN: [id number]   Date of Service  05/31/2019  HPI/Events of Note  Portable CXR not suggestive of new acute aspiration  eICU Interventions  None        Frederik Pear 05/31/2019, 5:16 AM

## 2019-05-31 NOTE — Progress Notes (Signed)
Circle Progress Note Patient Name: Xavier Doyle DOB: 05/30/1960 MRN: [id number]   Date of Service  05/31/2019  HPI/Events of Note  Pt was being given a bath when it appears he may have aspirated because there is enteral nutrition-like material being suctioned from the ETT.  eICU Interventions  Stat portable CXR to r/o aspiration        Jorel Gravlin U Soliana Kitko 05/31/2019, 1:37 AM

## 2019-05-31 NOTE — Plan of Care (Signed)
  Problem: Clinical Measurements: Goal: Ability to maintain clinical measurements within normal limits will improve Outcome: Not Progressing Goal: Diagnostic test results will improve Outcome: Not Progressing Goal: Respiratory complications will improve Outcome: Not Progressing Goal: Cardiovascular complication will be avoided Outcome: Not Progressing   Problem: Activity: Goal: Risk for activity intolerance will decrease Outcome: Not Progressing

## 2019-05-31 NOTE — Progress Notes (Signed)
Chaplain engaged in initial visit with Xavier Doyle.  Chaplain introduced herself to Xavier Doyle and prayed over Xavier Doyle.  Chaplain is honoring Xavier Doyle family's request for a chaplain to visit.  Chaplain will continue to follow-up.

## 2019-05-31 NOTE — Progress Notes (Signed)
Attempted to call Mr Xavier Doyle patient's son to give update unable to be reached Tried few times

## 2019-05-31 NOTE — Progress Notes (Signed)
While giving bath, pt appeared to have aspirated on tube feeds while lying flat and being turned. 3 RNs were in room bathing patient and verified that tube feeds were held while turning pt and lying pt flat. Pt produced copious secretions from oral and nasal cavities as well as inside ETT (all were suctioned thoroughly). Despite becoming dyssynchronous with ventilator and profuse secretions, O2 sats remained 95-100%. 50 mcg fentanyl and 4 mg versed blouses were given to aid with vent synchrony. RT came to bedside to assist this RN with vent compliance. CCM notified shortly after secretions began. Tube feeds turned off. Awaiting stat CXR.   Pt now in synchronus with vent. O2 sat currently 100%. Accessory muscle exertion w breathing has subsided and pt appears comfortable in bed.

## 2019-05-31 NOTE — Progress Notes (Signed)
[place] Progress Note Patient Name: Xavier Doyle DOB: October 23, 1960 MRN: [id number]   Date of Service  05/31/2019  HPI/Events of Note  K+ 3.2  eICU Interventions  KCL 10 meq Q 1 hour x 3        Yides Saidi U Maximillian Habibi 05/31/2019, 5:40 AM

## 2019-06-01 DIAGNOSIS — G931 Anoxic brain damage, not elsewhere classified: Secondary | ICD-10-CM

## 2019-06-01 LAB — COMPREHENSIVE METABOLIC PANEL WITH GFR
ALT: 61 U/L — ABNORMAL HIGH (ref 0–44)
AST: 87 U/L — ABNORMAL HIGH (ref 15–41)
Albumin: 2.1 g/dL — ABNORMAL LOW (ref 3.5–5.0)
Alkaline Phosphatase: 97 U/L (ref 38–126)
Anion gap: 15 (ref 5–15)
BUN: 108 mg/dL — ABNORMAL HIGH (ref 6–20)
CO2: 24 mmol/L (ref 22–32)
Calcium: 9 mg/dL (ref 8.9–10.3)
Chloride: 101 mmol/L (ref 98–111)
Creatinine, Ser: 4.07 mg/dL — ABNORMAL HIGH (ref 0.61–1.24)
GFR calc Af Amer: 18 mL/min — ABNORMAL LOW
GFR calc non Af Amer: 15 mL/min — ABNORMAL LOW
Glucose, Bld: 139 mg/dL — ABNORMAL HIGH (ref 70–99)
Potassium: 2.9 mmol/L — ABNORMAL LOW (ref 3.5–5.1)
Sodium: 140 mmol/L (ref 135–145)
Total Bilirubin: 0.4 mg/dL (ref 0.3–1.2)
Total Protein: 6.4 g/dL — ABNORMAL LOW (ref 6.5–8.1)

## 2019-06-01 LAB — CBC WITH DIFFERENTIAL/PLATELET
Abs Immature Granulocytes: 0.83 10*3/uL — ABNORMAL HIGH (ref 0.00–0.07)
Basophils Absolute: 0 10*3/uL (ref 0.0–0.1)
Basophils Relative: 0 %
Eosinophils Absolute: 0.2 10*3/uL (ref 0.0–0.5)
Eosinophils Relative: 1 %
HCT: 32.1 % — ABNORMAL LOW (ref 39.0–52.0)
Hemoglobin: 10.2 g/dL — ABNORMAL LOW (ref 13.0–17.0)
Immature Granulocytes: 6 %
Lymphocytes Relative: 16 %
Lymphs Abs: 2 10*3/uL (ref 0.7–4.0)
MCH: 23.1 pg — ABNORMAL LOW (ref 26.0–34.0)
MCHC: 31.8 g/dL (ref 30.0–36.0)
MCV: 72.8 fL — ABNORMAL LOW (ref 80.0–100.0)
Monocytes Absolute: 1.4 10*3/uL — ABNORMAL HIGH (ref 0.1–1.0)
Monocytes Relative: 10 %
Neutro Abs: 8.6 10*3/uL — ABNORMAL HIGH (ref 1.7–7.7)
Neutrophils Relative %: 67 %
Platelets: 236 10*3/uL (ref 150–400)
RBC: 4.41 MIL/uL (ref 4.22–5.81)
RDW: 16.1 % — ABNORMAL HIGH (ref 11.5–15.5)
WBC: 13.1 10*3/uL — ABNORMAL HIGH (ref 4.0–10.5)
nRBC: 0 % (ref 0.0–0.2)

## 2019-06-01 LAB — GLUCOSE, CAPILLARY
Glucose-Capillary: 107 mg/dL — ABNORMAL HIGH (ref 70–99)
Glucose-Capillary: 127 mg/dL — ABNORMAL HIGH (ref 70–99)
Glucose-Capillary: 132 mg/dL — ABNORMAL HIGH (ref 70–99)
Glucose-Capillary: 134 mg/dL — ABNORMAL HIGH (ref 70–99)
Glucose-Capillary: 135 mg/dL — ABNORMAL HIGH (ref 70–99)
Glucose-Capillary: 138 mg/dL — ABNORMAL HIGH (ref 70–99)
Glucose-Capillary: 150 mg/dL — ABNORMAL HIGH (ref 70–99)

## 2019-06-01 LAB — TRIGLYCERIDES: Triglycerides: 141 mg/dL

## 2019-06-01 MED ORDER — POTASSIUM CHLORIDE 20 MEQ/15ML (10%) PO SOLN
40.0000 meq | ORAL | Status: AC
  Administered 2019-06-01 (×2): 40 meq
  Filled 2019-06-01 (×2): qty 30

## 2019-06-01 MED ORDER — DOBUTAMINE IN D5W 4-5 MG/ML-% IV SOLN
2.5000 ug/kg/min | INTRAVENOUS | Status: DC
  Administered 2019-06-02: 2.5 ug/kg/min via INTRAVENOUS
  Filled 2019-06-01: qty 250

## 2019-06-01 NOTE — Progress Notes (Signed)
RT Note:  Bite block placed.  Patient teeth embedded in tongue. Small amount of  bleeding.

## 2019-06-01 NOTE — Progress Notes (Addendum)
PULMONARY / CRITICAL CARE MEDICINE   NAME:  Xavier Doyle, MRN:  [id number], DOB:  1961/02/06, LOS: 8 ADMISSION DATE:  05/16/2019, CONSULTATION DATE: 05/12/2019 REFERRING MD: Family medicine teaching service, CHIEF COMPLAINT: Cardiac arrest  BRIEF HISTORY:    Xavier Doyle is a 59 year old male with hypertension, hyperlipidemia, asthma, depression who was admitted on 05/15/2019 for chest pain.  Patient was found to have newly diagnosed systolic heart failure with EF of 25-30% with global hypokinesis.  Left and right heart catheterization did not show any ischemic cardiac disease.  Patient's chest pain was thought to be due to hypertensive endorgan damage.  During hospitalization patient had a cardiac arrest.  He was found to be in torsades which converted into PEA.  Patient received epi x3, amiodarone, bicarb x2, defibrillation.  ROSC was obtained and patient was started on TTM along with dopamine.   SIGNIFICANT PAST MEDICAL HISTORY    Allergy, Anxiety, Arthritis, Asthma, Depression, Heart murmur, Hyperlipidemia, Hypertension, and Osteoporosis.  SIGNIFICANT EVENTS:   1/17 admitted to [facility] for chest pain 1/19 V. fib arrest subsequent to torsades, TTM initiated 1/20 Repeat TTE without new changes  1/21 EEG stopped, Aggressive diuresis with lasix drip and metolazone  1/23 Completed TTM 1/24 Myoclonic activity noted started on versed and placed on eeg 1/25 spoke with patient's son he mentioned that he would like for all care to be resumed till he comes from [place] (tentatatively 1/27) Palliative care discussion  1/26 Total body myoclonus   STUDIES:    TTE 05/23/2019 Left ventricular ejection fraction, by visual estimation, is 25 to 30%. The left ventricle has severely decreased function. There is moderately increased left ventricular hypertrophy. 2. Left ventricular diastolic parameters are consistent with Grade I diastolic dysfunction (impaired relaxation). 3. The left ventricle  demonstrates global hypokinesis. 4. Global right ventricle has mildly reduced systolic function.The right ventricular size is normal. No increase in right ventricular wall thickness. 5. Left atrial size was mildly dilated. 6. Right atrial size was mildly dilated. 7. The mitral valve is normal in structure. Mild mitral valve regurgitation. No evidence of mitral stenosis. 8. The tricuspid valve is normal in structure. Tricuspid valve regurgitation is not demonstrated. 9. The aortic valve is tricuspid. Aortic valve regurgitation is mild. Mild aortic valve sclerosis without stenosis. 10. The inferior vena cava is normal in size with greater than 50% respiratory variability, suggesting right atrial pressure of 3 mmHg. 11. TR signal is inadequate for assessing pulmonary artery systolic pressure.  TTE 05/24/2018  Left ventricular ejection fraction, by visual estimation, is 25 to 30%. The left ventricle has  severely decreased function. There is mildly increased left ventricular hypertrophy. 2. Left ventricular diastolic parameters are consistent with Grade I diastolic dysfunction (impaired relaxation). 3. The left ventricle demonstrates global hypokinesis. 4. Global right ventricle has moderately reduced systolic function.The right ventricular size is normal. No increase in right ventricular wall thickness. 5. Left atrial size was normal. 6. Right atrial size was normal. 7. The mitral valve is normal in structure. Trivial mitral valve regurgitation. No evidence of mitral stenosis. 8. The tricuspid valve is normal in structure. 9. The tricuspid valve is normal in structure. Tricuspid valve regurgitation is trivial. 10. The aortic valve is tricuspid. Aortic valve regurgitation is trivial. No evidence of aortic valve sclerosis or stenosis. 11. The pulmonic valve was normal in structure. Pulmonic valve regurgitation is not visualized. 12. Moderately elevated pulmonary artery systolic pressure. 13. The  tricuspid regurgitant velocity is 2.73 m/s, and with an assumed right  atrial pressure of 15 mmHg, the estimated right ventricular systolic pressure is moderately elevated at 44.8 mmHg. 14. The inferior vena cava is dilated in size with <50% respiratory variability, suggesting right atrial pressure of 15 mmHg. 15. No significant change from prior study.  Right/left heart cath 05/23/2019  Mid RCA lesion is 10% stenosed. No significant CAD.  LV end diastolic pressure is moderately elevated. LVEDP 26 mm Hg.  There is no aortic valve stenosis.  Hemodynamic findings consistent with mild pulmonary hypertension.  Ao sat 96%, PA sat 67%, mean PA pressure 29 mm Hg; mean PCWP 20; CO 6.3 L/min, CI 2.72   Medical therapy for nonischemic cardiomyopathy.  CULTURES:   Covid negative Blood culture 1/21 no growth< 24hrs in 2 bottles Respiratory culture 1/22 pending  ANTIBIOTICS:  Vancomycin 1/21>1/22 Cefepime 1/21>  LINES/TUBES:  ETT 1/19 L CVC 1/19  CONSULTANTS:  PCCM Cardiology Palliative care   SUBJECTIVE:    CONSTITUTIONAL: BP 98/62 (BP Location: Left Arm)   Pulse 92   Temp 98.4 F (36.9 C)   Resp (!) 30   Ht 5\' 8"  (1.727 m)   Wt 127 kg Comment: reweighed multiple times  SpO2 100%   BMI 42.57 kg/m   I/O last 3 completed shifts: In: 4285.1 [I.V.:2359.3; NG/GT:1275; IV Piggyback:650.7] Out: [id number]; Stool:100]  CVP:  [6 mmHg-9 mmHg] 7 mmHg  Vent Mode: PRVC FiO2 (%):  [40 %-50 %] 40 % Set Rate:  [30 bmp] 30 bmp Vt Set:  [540 mL] 540 mL PEEP:  [10 cmH20] 10 cmH20 Plateau Pressure:  [23 [id number] cmH20] 23 cmH20  PHYSIAM: General: sedated and intubated Neuro: corneal reflex, sluggish pupils, myoclonus in face and extremities HEENT: et tube in place Cardiovascular:s1 and s2 audible Lungs: diffuse rales auscultated  Abdomen: distended Musculoskeletal: no edema Skin: warm extremities   ASSESSMENT AND PLAN    Cardiogenic shock secondary to V. fib arrest on  05/17/2019 s/p TTM completion on 1/23 HFrEF (EF 25-30% G1DD)  Patient has put out 3.6L over the past 24 hrs. Creatinine stable 3.82>4.07  Patient continues to be titrated on levophed and dobutamine   Patient son Xavier Doyle has been tried to reach several times by myself, nursing staff, palliative care.  Patient's son was originally scheduled to come visit the patient from [place] today however it appears that he is not been able to coordinate with his workplace.  We will continue to contact the patient's son to provide updates.  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  He is getting Versed, valproic acid, fentanyl.  The myoclonus is likely as a result of anoxic brain injury.  -titrate versed  -titrate valproate  -continue eeg monitoring   Acute hypoxemic respiratory failure Hospital acquired/Aspiration Pneumonia Thought to be secondary to cardiac arrest and pulmonary edema.  He was also being treated for a possible aspiration event with cefepime.  Respiratory culture 1/22 growing rare staph aureus, group b strep that is resistant to ciprofloxacin and erythromycin.   -Continue cefepime for HAP day 7/7 -Elevate head of bed -VAP protocol  Acute oliguric renal failure Cardiorenal syndrome Patient creatinine remains stable  3.824.07 Patient thought to not be a good candidate for crrt. Potassium 2.9  -cl 21meq via ft q 4hrs x2   Acute encephalopathy  Multifactorial-hypoxic, metabolic, infectious. Patient has severe anoxic brain  inury  -Continue ventilatory support -keppra for seizure precaution -Continue to do frequent neurological checks -blood culture no growth 2days  -urine culture cancelled for some reason -Respiratory culture growing gram positive cocci  -ua neg nitrite, leukocytes, wbc 11-20, rare bacteria   Diabetes mellitus  A1C 6.4. Glucose ranging 100-140s   -continue levemir 5u qd  Best Practice / Goals of Care / Disposition.   DVT PROPHYLAXIS: heparin sq SUP: protonix NUTRITION: npo MOBILITY: bedrest GOALS OF CARE: full FAMILY DISCUSSIONS: Patient's son is not able to be reached DISPOSITION ICU  LABS  Glucose Recent Labs  Lab 05/31/19 1510 05/31/19 2058 05/31/19 2343 06/01/19 0405 06/01/19 0430 06/01/19 0745  GLUCAP 109* 126* 141* 107* 132* 138*    BMET Recent Labs  Lab 05/30/19 0401 05/31/19 0359 06/01/19 0423  NA 138 140 140  K 3.0* 3.2* 2.9*  CL 104 102 101  CO2 20* 23 24  BUN 79* 91* 108*  CREATININE 4.14* 3.82* 4.07*  GLUCOSE 131* 122* 139*    Liver Enzymes Recent Labs  Lab 05/26/19 0230 05/30/19 0401 06/01/19 0423  AST 235* 101* 87*  ALT 130* 57* 61*  ALKPHOS 86 78 97  BILITOT 0.3 0.6 0.4  ALBUMIN 2.8* 1.8* 2.1*    Electrolytes Recent Labs  Lab 05/28/19 0454 05/28/19 0454 05/28/19 1634 05/29/19 0344 05/29/19 0344 05/30/19 0401 05/31/19 0359 06/01/19 0423  CALCIUM 7.7*   < >  --  7.5*   < > 8.3* 8.9 9.0  MG 1.9   < > 1.7 1.6*  --  2.0 1.9  --   PHOS 7.5*  --  5.8* 4.2  --   --   --   --    < > = values in this interval not displayed.    CBC Recent Labs  Lab 05/30/19 0401 05/31/19 0359 06/01/19 0423  WBC 9.1 11.0* 13.1*  HGB 9.9* 10.8* 10.2*  HCT 31.5* 33.9* 32.1*  PLT 211 227 236    ABG Recent Labs  Lab 05/28/19 0753 05/29/19 0328 05/29/19 1050  PHART 7.266* 7.322* 7.436  PCO2ART 39.2 38.9 26.7*  PO2ART 162.0* 148.0* 94.7    Coag's No results for input(s): APTT, INR in the last 168 hours.  Sepsis Markers No results  for input(s): LATICACIDVEN, PROCALCITON, O2SATVEN in the last 168 hours.  Cardiac Enzymes No results for input(s): TROPONINI, PROBNP in the last 168 hours.  REVIEW OF SYSTEMS:    Unable to gather as patient is intubated and sedated

## 2019-06-01 NOTE — Progress Notes (Signed)
[facility] Progress Note Patient Name: Kamarion Cicale DOB: 1961/03/22 MRN: [id number]   Date of Service  06/01/2019  HPI/Events of Note  K+ 2.9  eICU Interventions  KCL 40 meq via FT Q 4 hours x 2        Makenzye Troutman U Codylee Patil 06/01/2019, 5:53 AM

## 2019-06-01 NOTE — Progress Notes (Signed)
Family practice teaching service appreciates the excellent care that Xavier Doyle is receiving.  We are following along with his clinical course. Although prognosis seems very poor, our team is ready to receive him when deemed appropriate.

## 2019-06-01 NOTE — Plan of Care (Signed)
  Problem: Clinical Measurements: Goal: Ability to maintain clinical measurements within normal limits will improve Outcome: Progressing Goal: Cardiovascular complication will be avoided Outcome: Progressing   Problem: Nutrition: Goal: Adequate nutrition will be maintained Outcome: Progressing   Problem: Elimination: Goal: Will not experience complications related to bowel motility Outcome: Progressing Goal: Will not experience complications related to urinary retention Outcome: Progressing   Problem: Pain Managment: Goal: General experience of comfort will improve Outcome: Progressing

## 2019-06-01 NOTE — Progress Notes (Signed)
Progress Note  Patient Name: Xavier Doyle Date of Encounter: 06/01/2019  Primary Cardiologist: Belva Crome III, MD   Subjective   Intubated, cvp 11,  urine output good. On amiodarone, Occ PVCs  Inpatient Medications    Scheduled Meds: . aspirin  81 mg Per Tube Daily  . atorvastatin  80 mg Per Tube q1800  . chlorhexidine gluconate (MEDLINE KIT)  15 mL Mouth Rinse BID  . Chlorhexidine Gluconate Cloth  6 each Topical Daily  . clonazepam  0.25 mg Per Tube BID  . feeding supplement (PRO-STAT SUGAR FREE 64)  60 mL Per Tube BID  . heparin  5,000 Units Subcutaneous Q8H  . insulin detemir  5 Units Subcutaneous Daily  . mouth rinse  15 mL Mouth Rinse 10 times per day  . oxyCODONE  5 mg Per Tube Q6H  . pantoprazole (PROTONIX) IV  40 mg Intravenous Q24H  . potassium chloride  40 mEq Per Tube Q4H  . QUEtiapine  25 mg Per Tube Daily  . valproic acid  500 mg Per Tube BID   Continuous Infusions: . amiodarone 30 mg/hr (06/01/19 0600)  . DOBUTamine 2.5 mcg/kg/min (06/01/19 0600)  . feeding supplement (VITAL AF 1.2 CAL) 45 mL/hr at 05/31/19 1900  . fentaNYL infusion INTRAVENOUS 150 mcg/hr (06/01/19 0600)  . furosemide (LASIX) infusion 5 mg/hr (06/01/19 0600)  . levETIRAcetam Stopped (06/01/19 0403)  . midazolam 15 mg/hr (06/01/19 0600)  . norepinephrine (LEVOPHED) Adult infusion Stopped (05/31/19 1628)  . sodium chloride     PRN Meds: acetaminophen, albuterol, fentaNYL, ipratropium-albuterol, midazolam, nitroGLYCERIN, ondansetron (ZOFRAN) IV, vecuronium   Vital Signs    Vitals:   06/01/19 0500 06/01/19 0600 06/01/19 0700 06/01/19 0746  BP: 136/84 114/67 113/68   Pulse: 87 84 81   Resp: (!) 30 (!) 30 (!) 30   Temp: 97.9 F (36.6 C)   98.4 F (36.9 C)  TempSrc:      SpO2: 100% 100% 99%   Weight:      Height:        Intake/Output Summary (Last 24 hours) at 06/01/2019 0747 Last data filed at 06/01/2019 0600 Gross per 24 hour  Intake 2738.45 ml  Output 3645 ml  Net -906.55  ml   Last 3 Weights 06/01/2019 05/31/2019 05/30/2019  Weight (lbs) 279 lb 15.8 oz 300 lb 4.3 oz 298 lb 11.6 oz  Weight (kg) 127 kg 136.2 kg 135.5 kg      Telemetry    Sinus rhythm, Occ pvcs- personally Reviewed  ECG    None today  Physical Exam   GEN: intubated, unresponsive Neck: unable to assess, lines in place Cardiac:  regular rhythm normal rate, no murmurs Respiratory:  ventilator breath sounds GI:   Abdomen distended but is soft Neuro:   sedated, intubated, no response other than some myoclonus on right   Labs    High Sensitivity Troponin:   Recent Labs  Lab 05/18/2019 1110 05/12/2019 1251 05/23/19 0728 05/17/2019 1733 05/31/2019 1934  TROPONINIHS 52* 54* 58* 116* 13,071*      Chemistry Recent Labs  Lab 05/26/19 0230 05/26/19 0430 05/30/19 0401 05/31/19 0359 06/01/19 0423  NA 139   < > 138 140 140  K 3.6   < > 3.0* 3.2* 2.9*  CL 108   < > 104 102 101  CO2 19*   < > 20* 23 24  GLUCOSE 132*   < > 131* 122* 139*  BUN 24*   < > 79* 91* 108*  CREATININE  1.51*   < > 4.14* 3.82* 4.07*  CALCIUM 7.9*   < > 8.3* 8.9 9.0  PROT 6.3*  --  6.1*  --  6.4*  ALBUMIN 2.8*  --  1.8*  --  2.1*  AST 235*  --  101*  --  87*  ALT 130*  --  57*  --  61*  ALKPHOS 86  --  78  --  97  BILITOT 0.3  --  0.6  --  0.4  GFRNONAA 50*   < > 15* 16* 15*  GFRAA 58*   < > 17* 19* 18*  ANIONGAP 12   < > _0 < > = values in this interval not displayed.     Hematology Recent Labs  Lab 05/30/19 0401 05/31/19 0359 06/01/19 0423  WBC 9.1 11.0* 13.1*  RBC 4.34 4.71 4.41  HGB 9.9* 10.8* 10.2*  HCT 31.5* 33.9* 32.1*  MCV 72.6* 72.0* 72.8*  MCH 22.8* 22.9* 23.1*  MCHC 31.4 31.9 31.8  RDW 16.4* 16.3* 16.1*  PLT 211 227 236    BNP Recent Labs  Lab 05/27/19 1601  BNP 584.4*     DDimer  No results for input(s): DDIMER in the last 168 hours.   Radiology    DG Chest Port 1 View  Result Date: 05/31/2019 CLINICAL DATA:  Acute respiratory failure EXAM: PORTABLE CHEST 1 VIEW  COMPARISON:  Radiograph 05/30/2019 FINDINGS: *Endotracheal tube position in the mid trachea, 4 cm from the carina. *Transesophageal feeding tube tip terminates beyond the GE junction, below the level of imaging. *Left IJ central venous catheter tip terminates at the brachiocephalic-caval confluence. *Telemetry leads overlie the chest. There are patchy bilateral airspace opacities more focal confluent opacity is seen in the left lung base with obscuration of left hemidiaphragm which could reflect a combination of pleural fluid and parenchymal disease. No pneumothorax. Visualized cardiomediastinal contours are similar to prior accounting for differences in technique. No acute osseous or soft tissue abnormality. IMPRESSION: 1. Patchy bilateral airspace opacities are similar to prior. 2. More confluent opacity in the left lung base is slightly decreased from prior. Could reflect a combination of pleural fluid and parenchymal disease. 3. Lines and tubes as above Electronically Signed   By: Lovena Le M.D.   On: 05/31/2019 03:34   DG CHEST PORT 1 VIEW  Result Date: 05/30/2019 CLINICAL DATA:  Ventilator support.  Follow-up. EXAM: PORTABLE CHEST 1 VIEW COMPARISON:  05/28/2019 FINDINGS: Endotracheal tube tip is 4 cm above the carina. Soft feeding tube enters the abdomen. Left internal jugular central line tip is in the SVC at the azygos level. Right lung shows improvement with better aeration in the lower lobe. Worsened opacity in the left hemithorax with less aerated upper lobe. Findings could be consistent with consolidation and pleural fluid. IMPRESSION: Worsened appearance on the left with less aerated lung. Consolidation and possible pleural fluid. Improved aeration of the right lung base. Electronically Signed   By: Nelson Chimes M.D.   On: 05/30/2019 09:20   EEG LTVM - Continuous Bedside W/ Video Includes Portable EEG Read  Result Date: 05/30/2019 Lora Havens, MD     05/30/2019  1:52 PM Patient  Name:Emannuel Nyoka Cowden TGG:[id number] Epilepsy Attending:Priyanka [name] Referring Physician/Provider:Dr. Dyann Ruddle Duration:05/29/2019 1529 to 05/30/2019 1036  Patient history:58yo s/p cardiac arrest now on TTM. EEG to evaluate for seizure  Level of alertness:comatose  AEDs during EEG study:Keppra, valproate, Versed, clonazepam  Technical aspects: This EEG study was done  with scalp electrodes positioned according to the 10-20 International system of electrode placement. Electrical activity was acquired at a sampling rate of _0  and reviewed with a high frequency filter of _1  and a low frequency filter of _2 . EEG data were recorded continuously and digitally stored.  DESCRIPTION: EEG showed continuous generalized background suppression. EEG was reactive to tactile stimulation. Hyperventilation and photic stimulation were not performed.  EKG artifact was seen throughout the study. Event button was pressed multiple times during the study (On 05/29/2019 1701 and 2026, on 05/30/2019 at 739, 0800 and 0854). On video, patient does appear to have whole-body twitching or right or left side twitching. Concomitant EEG before during and after the event and does not show any EEG change to suggest seizure.  ABNORMALITY -Backgroundsuppression, generalized   IMPRESSION: This study isshowed evidence of profound diffuse encephalopathy, non specific to etiology. No seizures ordefiniteepileptiform discharges were seen throughout the recording. Event button was pressed multiple times as described above for whole body twitching and left or right leg twitching without concomitant EEG change and is therefore not epileptic. Of note, it appears that these episodes may be triggered by stimulation and given the semiology of the events could be stimulation induced myoclonus or subcortical myoclonus.  Priyanka O Yadav  VAS [place] UPPER EXTREMITY VENOUS DUPLEX  Result Date: 05/31/2019 UPPER VENOUS STUDY  Indications:  Swelling Limitations: Poor ultrasound/tissue interface, bandages and line. Performing Technologist: Antonieta Pert RDMS, RVT  Examination Guidelines: A complete evaluation includes B-mode imaging, spectral Doppler, color Doppler, and power Doppler as needed of all accessible portions of each vessel. Bilateral testing is considered an integral part of a complete examination. Limited examinations for reoccurring indications may be performed as noted.  Right Findings: +----------+------------+---------+-----------+----------+-------+ RIGHT     CompressiblePhasicitySpontaneousPropertiesSummary +----------+------------+---------+-----------+----------+-------+ Subclavian    Full       Yes       Yes                      +----------+------------+---------+-----------+----------+-------+  Left Findings: +----------+------------+---------+-----------+----------+-------------------+ LEFT      CompressiblePhasicitySpontaneousProperties      Summary       +----------+------------+---------+-----------+----------+-------------------+ IJV           Full       Yes       Yes                                  +----------+------------+---------+-----------+----------+-------------------+ Subclavian    Full       Yes       Yes                                  +----------+------------+---------+-----------+----------+-------------------+ Axillary      Full       Yes       Yes                                  +----------+------------+---------+-----------+----------+-------------------+ Brachial      Full                                                      +----------+------------+---------+-----------+----------+-------------------+ Radial  Not visualized    +----------+------------+---------+-----------+----------+-------------------+ Ulnar                                                 Not visualized     +----------+------------+---------+-----------+----------+-------------------+ Cephalic      Full                                                      +----------+------------+---------+-----------+----------+-------------------+ Basilic     Partial                                 not well visualized +----------+------------+---------+-----------+----------+-------------------+  Summary:  Right: No evidence of thrombosis in the subclavian.  Left: No evidence of deep vein thrombosis in the upper extremity. No evidence of superficial vein thrombosis in the upper extremity. Left forearm covered by bandages.  *See table(s) above for measurements and observations.    Preliminary     Cardiac Studies    Cardiac cath did not reveal significant coronary obstruction  Decreased LV systolic function with elevated filling pressures consistent with acute on chronic combined systolic and diastolic heart failure.  Patient Profile     59 y.o. male with a historyof hypertension, hyperlipidemia, asthma, arthritis, anxiety/depression,alcohol abuse, prior cocaine abuse, hypertension, debility due to osteoarthritis, and recently diagnosed nonischemic cardiomyopathy with LVEF less than 35%.  In hospital V. fib cardiac arrest precipitated by R on T.  Prolonged CPR after relatively prolonged downtime (greater than 10 minutes). . Assessment & Plan     1. Ventricular fibrillation cardiac arrest (R on T phenomenon):  -  No VT on tele. PVCs. Amio IV. Continue amiodarone.  2. Chronic combined systolic and diastolic heart failure:  -  On IV dobutamine. Off Levophed. Urine output stable.  Still volume overloaded.   3. Respiratory failure, hypoxic: Being managed by critical care.  intubated  4. Acute kidney injury: creatinine peaked at 4.5. now leveled out at 4. No plans for dialysis.   5. Anoxic encephalopathy. Completed cooling protocol but still unresponsive. Outcome is poor. Palliative care discussions  in the works. Awaiting for son to arrive from [place]. Do not anticipate surviving this hospital stay.   Patient is critical but stable from a cardiac standpoint. Do not anticipate significant Neurologic improvement. Awaiting family discussion concerning withdrawal of care. Nothing further to add from a cardiac standpoint now. Will follow at a distance.   [facility] will sign off.   Medication Recommendations:  As per [facility] Other recommendations (labs, testing, etc):  none Follow up as an outpatient:  N/A   For questions or updates, please contact [facility] Please consult [email or web address] for contact info under        Signed, Mahonri Seiden [place], MD  06/01/2019, 7:47 AM

## 2019-06-01 NOTE — Care Management (Signed)
1452 06-01-19 Case Manager has reached out to son and unable to reach him. Awaiting a return phone call. Case Manager wanted to see if the son is active [employer]- in order to reach out to the [employer] to see if we can expedite his arrival. Case Manager will await call back from the son. Bethena Roys , RN,BSN Case Manager

## 2019-06-01 NOTE — Progress Notes (Signed)
Assisted tele visit to patient with son.  Mylynn Dinh Samson, RN  

## 2019-06-02 LAB — CBC WITH DIFFERENTIAL/PLATELET
Abs Immature Granulocytes: 1.19 10*3/uL — ABNORMAL HIGH (ref 0.00–0.07)
Basophils Absolute: 0.1 10*3/uL (ref 0.0–0.1)
Basophils Relative: 1 %
Eosinophils Absolute: 0.2 10*3/uL (ref 0.0–0.5)
Eosinophils Relative: 1 %
HCT: 34.5 % — ABNORMAL LOW (ref 39.0–52.0)
Hemoglobin: 10.7 g/dL — ABNORMAL LOW (ref 13.0–17.0)
Immature Granulocytes: 8 %
Lymphocytes Relative: 19 %
Lymphs Abs: 2.9 10*3/uL (ref 0.7–4.0)
MCH: 23 pg — ABNORMAL LOW (ref 26.0–34.0)
MCHC: 31 g/dL (ref 30.0–36.0)
MCV: 74.2 fL — ABNORMAL LOW (ref 80.0–100.0)
Monocytes Absolute: 1.6 10*3/uL — ABNORMAL HIGH (ref 0.1–1.0)
Monocytes Relative: 10 %
Neutro Abs: 9.4 10*3/uL — ABNORMAL HIGH (ref 1.7–7.7)
Neutrophils Relative %: 61 %
Platelets: 270 10*3/uL (ref 150–400)
RBC: 4.65 MIL/uL (ref 4.22–5.81)
RDW: 16.4 % — ABNORMAL HIGH (ref 11.5–15.5)
WBC: 15.4 10*3/uL — ABNORMAL HIGH (ref 4.0–10.5)
nRBC: 0 % (ref 0.0–0.2)

## 2019-06-02 LAB — BASIC METABOLIC PANEL
Anion gap: 14 (ref 5–15)
Anion gap: 15 (ref 5–15)
BUN: 120 mg/dL — ABNORMAL HIGH (ref 6–20)
BUN: 121 mg/dL — ABNORMAL HIGH (ref 6–20)
CO2: 26 mmol/L (ref 22–32)
CO2: 25 mmol/L (ref 22–32)
Calcium: 9.2 mg/dL (ref 8.9–10.3)
Calcium: 9.4 mg/dL (ref 8.9–10.3)
Chloride: 102 mmol/L (ref 98–111)
Chloride: 104 mmol/L (ref 98–111)
Creatinine, Ser: 3.56 mg/dL — ABNORMAL HIGH (ref 0.61–1.24)
Creatinine, Ser: 3.46 mg/dL — ABNORMAL HIGH (ref 0.61–1.24)
GFR calc Af Amer: 21 mL/min — ABNORMAL LOW (ref >60)
GFR calc Af Amer: 21 mL/min — ABNORMAL LOW (ref >60)
GFR calc non Af Amer: 18 mL/min — ABNORMAL LOW (ref >60)
GFR calc non Af Amer: 18 mL/min — ABNORMAL LOW (ref >60)
Glucose, Bld: 153 mg/dL — ABNORMAL HIGH (ref 70–99)
Glucose, Bld: 160 mg/dL — ABNORMAL HIGH (ref 70–99)
Potassium: 3 mmol/L — ABNORMAL LOW (ref 3.5–5.1)
Potassium: 3.4 mmol/L — ABNORMAL LOW (ref 3.5–5.1)
Sodium: 142 mmol/L (ref 135–145)
Sodium: 144 mmol/L (ref 135–145)

## 2019-06-02 LAB — GLUCOSE, CAPILLARY
Glucose-Capillary: 124 mg/dL — ABNORMAL HIGH (ref 70–99)
Glucose-Capillary: 126 mg/dL — ABNORMAL HIGH (ref 70–99)
Glucose-Capillary: 131 mg/dL — ABNORMAL HIGH (ref 70–99)
Glucose-Capillary: 141 mg/dL — ABNORMAL HIGH (ref 70–99)
Glucose-Capillary: 147 mg/dL — ABNORMAL HIGH (ref 70–99)
Glucose-Capillary: 151 mg/dL — ABNORMAL HIGH (ref 70–99)

## 2019-06-02 LAB — PHOSPHORUS: Phosphorus: 4.6 mg/dL (ref 2.5–4.6)

## 2019-06-02 LAB — MAGNESIUM: Magnesium: 2.2 mg/dL (ref 1.7–2.4)

## 2019-06-02 MED ORDER — FUROSEMIDE 10 MG/ML IJ SOLN
80.0000 mg | Freq: Two times a day (BID) | INTRAMUSCULAR | Status: DC
  Administered 2019-06-02 – 2019-06-03 (×2): 80 mg via INTRAVENOUS
  Filled 2019-06-02 (×2): qty 8

## 2019-06-02 MED ORDER — POTASSIUM CHLORIDE 20 MEQ/15ML (10%) PO SOLN
40.0000 meq | Freq: Once | ORAL | Status: AC
  Administered 2019-06-02: 17:00:00 40 meq via ORAL
  Filled 2019-06-02: qty 30

## 2019-06-02 MED ORDER — POTASSIUM CHLORIDE 20 MEQ/15ML (10%) PO SOLN
40.0000 meq | Freq: Once | ORAL | Status: AC
  Administered 2019-06-02: 40 meq via ORAL
  Filled 2019-06-02: qty 30

## 2019-06-02 MED ORDER — VITAL AF 1.2 CAL PO LIQD
1000.0000 mL | ORAL | Status: DC
  Administered 2019-06-02 – 2019-06-07 (×5): 1000 mL

## 2019-06-02 MED FILL — Sodium Chloride IV Soln 0.9%: INTRAVENOUS | Qty: 250 | Status: AC

## 2019-06-02 MED FILL — Fentanyl Citrate Preservative Free (PF) Inj 2500 MCG/50ML: INTRAMUSCULAR | Qty: 50 | Status: AC

## 2019-06-02 NOTE — Progress Notes (Signed)
     Left forearm with 3-4clear fluid filled blisters in left forearm. Some areas of superficial ulceration present. Radial pulse intact.   -wound care consult

## 2019-06-02 NOTE — Progress Notes (Signed)
Nutrition Follow up  DOCUMENTATION CODES:   Obesity unspecified  INTERVENTION:   Continue tube feeding:  -Vital AF @ 50 ml/hr via Cortrak (1200 ml) -60 ml Prostat BID  Provides: 1840 kcals, 150 grams protein, 973 ml free water. Meets 108% kcal needs and 100% protein needs.   NUTRITION DIAGNOSIS:   Increased nutrient needs related to acute illness as evidenced by estimated needs.  Ongoing  GOAL:   Patient will meet greater than or equal to 90% of their needs   Addressed via TF  MONITOR:   Weight trends, Diet advancement, Vent status, Skin, TF tolerance, I & O's  REASON FOR ASSESSMENT:   Ventilator    ASSESSMENT:   Patient with PMH significant for HLD, HTN, OSA, ETOH abuse, and asthma. Presents this admission with new CHF.   1/19- L/R heart cath, s/p PEA arrest, intubated  1/22- s/p post pyloric Cortrak   1/23- completed TTM   Pt discussed during ICU rounds and with RN.   Concern for anoxic brain injury. Prognosis poor. Son traveling from out of state to discuss [place]. No plans for CRRT. Pt tolerating tube feeding at goal. Noted new wounds. Will increase TF to provide more protein.   Admission weight: 124.7 kg   Current weight: 125.6 kg   Patient remains intubated on ventilator support MV: 16 L/min Temp (24hrs), Avg:98.4 F (36.9 C), Min:97.7 F (36.5 C), Max:99 F (37.2 C)  I/O: -2,476 ml since admit  UOP: 3,385 ml x 24 hrs  Rectal tube: 150 ml x 24 hrs   Drips: fentanyl, versed Medications: 80 mg lasix BID, levemir, 40 mEq KCl  Labs: K 3.0 (L) Cr 3.56- trending down BUN 120-trending up CBG 126-147  Diet Order:   Diet Order    None      EDUCATION NEEDS:   Not appropriate for education at this time  Skin:  Skin Assessment: Skin Integrity Issues: Skin Integrity Issues:: Stage I, DTI, Stage II DTI: R head Stage I: L head Stage II: buttocks  Last BM:  1/28  Height:   Ht Readings from Last 1 Encounters:  05/09/2019 5\' 8"  (1.727 m)     Weight:   Wt Readings from Last 1 Encounters:  06/02/19 124.3 kg    Ideal Body Weight:  70 kg  BMI:  Body mass index is 41.67 kg/m.  Estimated Nutritional Needs:   Kcal:  1343- 1709 kcal  Protein:  140-175 grams  Fluid:  >/= 1.7 L/day   Mariana Single RD, LDN Clinical Nutrition Pager # - [phone]

## 2019-06-02 NOTE — Progress Notes (Signed)
Attempted to call patient's son, was not able to reach. Will continue to keep contacting son.

## 2019-06-02 NOTE — Progress Notes (Signed)
PULMONARY / CRITICAL CARE MEDICINE   NAME:  Xavier Doyle, MRN:  [id number], DOB:  08/06/1960, LOS: 9 ADMISSION DATE:  05/20/2019, CONSULTATION DATE: 05/27/2019 REFERRING MD: Family medicine teaching service, CHIEF COMPLAINT: Cardiac arrest  BRIEF HISTORY:    Xavier Doyle is a 59 year old male with hypertension, hyperlipidemia, asthma, depression who was admitted on 06/04/2019 for chest pain.  Patient was found to have newly diagnosed systolic heart failure with EF of 25-30% with global hypokinesis.  Left and right heart catheterization did not show any ischemic cardiac disease.  Patient's chest pain was thought to be due to hypertensive endorgan damage.  During hospitalization patient had a cardiac arrest.  He was found to be in torsades which converted into PEA.  Patient received epi x3, amiodarone, bicarb x2, defibrillation.  ROSC was obtained and patient was started on TTM along with dopamine.   SIGNIFICANT PAST MEDICAL HISTORY    Allergy, Anxiety, Arthritis, Asthma, Depression, Heart murmur, Hyperlipidemia, Hypertension, and Osteoporosis.  SIGNIFICANT EVENTS:   1/17 admitted to [facility] for chest pain 1/19 V. fib arrest subsequent to torsades, TTM initiated 1/20 Repeat TTE without new changes  1/21 EEG stopped, Aggressive diuresis with lasix drip and metolazone  1/23 Completed TTM 1/24 Myoclonic activity noted started on versed and placed on eeg 1/25 spoke with patient's son he mentioned that he would like for all care to be resumed till he comes from [place] (tentatatively 1/27) Palliative care discussion  1/26 Total body myoclonus   STUDIES:    TTE 05/23/2019 Left ventricular ejection fraction, by visual estimation, is 25 to 30%. The left ventricle has severely decreased function. There is moderately increased left ventricular hypertrophy. 2. Left ventricular diastolic parameters are consistent with Grade I diastolic dysfunction (impaired relaxation). 3. The left ventricle  demonstrates global hypokinesis. 4. Global right ventricle has mildly reduced systolic function.The right ventricular size is normal. No increase in right ventricular wall thickness. 5. Left atrial size was mildly dilated. 6. Right atrial size was mildly dilated. 7. The mitral valve is normal in structure. Mild mitral valve regurgitation. No evidence of mitral stenosis. 8. The tricuspid valve is normal in structure. Tricuspid valve regurgitation is not demonstrated. 9. The aortic valve is tricuspid. Aortic valve regurgitation is mild. Mild aortic valve sclerosis without stenosis. 10. The inferior vena cava is normal in size with greater than 50% respiratory variability, suggesting right atrial pressure of 3 mmHg. 11. TR signal is inadequate for assessing pulmonary artery systolic pressure.  TTE 05/24/2018  Left ventricular ejection fraction, by visual estimation, is 25 to 30%. The left ventricle has  severely decreased function. There is mildly increased left ventricular hypertrophy. 2. Left ventricular diastolic parameters are consistent with Grade I diastolic dysfunction (impaired relaxation). 3. The left ventricle demonstrates global hypokinesis. 4. Global right ventricle has moderately reduced systolic function.The right ventricular size is normal. No increase in right ventricular wall thickness. 5. Left atrial size was normal. 6. Right atrial size was normal. 7. The mitral valve is normal in structure. Trivial mitral valve regurgitation. No evidence of mitral stenosis. 8. The tricuspid valve is normal in structure. 9. The tricuspid valve is normal in structure. Tricuspid valve regurgitation is trivial. 10. The aortic valve is tricuspid. Aortic valve regurgitation is trivial. No evidence of aortic valve sclerosis or stenosis. 11. The pulmonic valve was normal in structure. Pulmonic valve regurgitation is not visualized. 12. Moderately elevated pulmonary artery systolic pressure. 13. The  tricuspid regurgitant velocity is 2.73 m/s, and with an assumed right  atrial pressure of 15 mmHg, the estimated right ventricular systolic pressure is moderately elevated at 44.8 mmHg. 14. The inferior vena cava is dilated in size with <50% respiratory variability, suggesting right atrial pressure of 15 mmHg. 15. No significant change from prior study.  Right/left heart cath 05/11/2019  Mid RCA lesion is 10% stenosed. No significant CAD.  LV end diastolic pressure is moderately elevated. LVEDP 26 mm Hg.  There is no aortic valve stenosis.  Hemodynamic findings consistent with mild pulmonary hypertension.  Ao sat 96%, PA sat 67%, mean PA pressure 29 mm Hg; mean PCWP 20; CO 6.3 L/min, CI 2.72   Medical therapy for nonischemic cardiomyopathy.  CULTURES:   Covid negative Blood culture 1/21 no growth< 24hrs in 2 bottles Respiratory culture 1/22 pending  ANTIBIOTICS:  Vancomycin 1/21>1/22 Cefepime 1/21>  LINES/TUBES:  ETT 1/19 L CVC 1/19  CONSULTANTS:  PCCM Cardiology Palliative care   SUBJECTIVE:  Patient is sedated and intubated. Overnight the patient's fio2 had to be increased to 100%.   CONSTITUTIONAL: BP 125/71   Pulse 79   Temp 99 F (37.2 C) (Core)   Resp (!) 30   Ht 5\' 8"  (1.727 m)   Wt 124.3 kg   SpO2 91%   BMI 41.67 kg/m   I/O last 3 completed shifts: In: 3425.3 [I.V.:2120.1; NG/GT:905; IV Piggyback:400.1] Out: [id number] [Urine:5185; Emesis/NG output:240; Stool:150]  CVP:  [5 mmHg-11 mmHg] 11 mmHg  Vent Mode: PRVC FiO2 (%):  [40 %-80 %] 80 % Set Rate:  [30 bmp] 30 bmp Vt Set:  [540 mL] 540 mL PEEP:  [8 cmH20-10 cmH20] 10 cmH20 Plateau Pressure:  [16 cmH20-30 cmH20] 25 cmH20  PHYSIAM: General: appears well Neuro:  Sedated, corneal and pupillary reflex intact, diffuse myoclonus HEENT: et tube in place Cardiovascular: rrr, s1 and s2 in place Lungs: ctab, no rales or rhonchi  Abdomen: soft, distended Musculoskeletal: no pitting edema Skin: warm  extremities   ASSESSMENT AND PLAN    Cardiogenic shock secondary to V. fib arrest on 06/01/2019 s/p TTM completion on 1/23 HFrEF (EF 25-30% G1DD)  Patient with continued good output. He put out 3.7L over the past 24 hrs.   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 Likely as result of anoxic brain injury.  -titrate versed  -titrate valproate  -continue eeg monitoring   Acute hypoxemic respiratory failure Hospital acquired/Aspiration Pneumonia Thought to be secondary to cardiac arrest and pulmonary edema.  He was also being treated for a possible aspiration event with cefepime.  Respiratory culture 1/22 growing rare staph aureus, group b strep that is resistant to ciprofloxacin and erythromycin.   -stop cefepime  -Elevate head of bed -VAP protocol  Cardiorenal syndrome Creatinine has improved from yesterday to 3.56 from 4.07. Proteinuria on ua 1/22.  -repleting potassium kcl 61meq twice   Acute encephalopathy  Multifactorial-hypoxic, metabolic, infectious. Patient has severe anoxic brain inury  -Continue ventilatory support -keppra for seizure precaution -Continue to do frequent neurological checks -blood culture no growth 2days  -urine culture cancelled due to no nitrite or leukocytes on ua -Respiratory culture growing gram positive cocci  -ua neg nitrite, leukocytes, wbc 11-20, rare bacteria   Diabetes mellitus  A1C 6.4. Glucose ranging 120-140s   -continue levemir 5u qd  Best Practice / Goals of Care / Disposition.   DVT PROPHYLAXIS: heparin sq SUP: protonix NUTRITION: npo MOBILITY: bedrest GOALS OF  CARE: full FAMILY DISCUSSIONS: team will call patient's son DISPOSITION ICU  LABS  Glucose Recent Labs  Lab 06/01/19 0745 06/01/19 1125 06/01/19 1633 06/01/19 2021 06/01/19 2349 06/02/19 0415  GLUCAP 138* 135* 134* 127* 150* 124*    BMET Recent Labs  Lab 05/30/19 0401 05/31/19 0359 06/01/19 0423  NA 138 140 140  K 3.0* 3.2* 2.9*  CL 104 102 101  CO2 20* 23 24  BUN 79* 91* 108*  CREATININE 4.14* 3.82* 4.07*  GLUCOSE 131* 122* 139*    Liver Enzymes Recent Labs  Lab 05/30/19 0401 06/01/19 0423  AST 101* 87*  ALT 57* 61*  ALKPHOS 78 97  BILITOT 0.6 0.4  ALBUMIN 1.8* 2.1*    Electrolytes Recent Labs  Lab 05/28/19 0454 05/28/19 0454 05/28/19 1634 05/29/19 0344 05/29/19 0344 05/30/19 0401 05/31/19 0359 06/01/19 0423  CALCIUM 7.7*   < >  --  7.5*   < > 8.3* 8.9 9.0  MG 1.9   < > 1.7 1.6*  --  2.0 1.9  --   PHOS 7.5*  --  5.8* 4.2  --   --   --   --    < > = values in this interval not displayed.    CBC Recent Labs  Lab 05/31/19 0359 06/01/19 0423 06/02/19 0336  WBC 11.0* 13.1* 15.4*  HGB 10.8* 10.2* 10.7*  HCT 33.9* 32.1* 34.5*  PLT 227 236 270    ABG Recent Labs  Lab 05/28/19 0753 05/29/19 0328 05/29/19 1050  PHART 7.266* 7.322* 7.436  PCO2ART 39.2 38.9 26.7*  PO2ART 162.0* 148.0* 94.7    Coag's No results for input(s): APTT, INR in the last 168 hours.  Sepsis Markers No results for input(s): LATICACIDVEN, PROCALCITON, O2SATVEN in the last 168 hours.  Cardiac Enzymes No results for input(s): TROPONINI, PROBNP in the last 168 hours.  REVIEW OF SYSTEMS:    Patient is intubated

## 2019-06-02 NOTE — Progress Notes (Signed)
Patient ID: Xavier Doyle, male   DOB: 1960/08/27, 59 y.o.   MRN: [id number]  This NP visited patient at the bedside as a follow up  for palliative medicine needs and emotional support.  Attempted to contact son/Quantavis Harris in [place]. Left multiple messages to the importance of a call back,  specifically related to his plan to come to see his father and make decisions regarding plan of care and recommended shift to a comfort approach.  Await callback   Discussed with Dr Lynetta Mare via secure chat and Dr Amie Critchley and bedside RN  Total time spent on the unit was 20 minutes     PMT will continue to support holistically  Greater than 50% of the time was spent in counseling and coordination of care  Wadie Lessen NP  Palliative Medicine Team Team Phone # [phone] Pager [phone]

## 2019-06-03 LAB — CBC WITH DIFFERENTIAL/PLATELET
Abs Immature Granulocytes: 1.34 10*3/uL — ABNORMAL HIGH (ref 0.00–0.07)
Basophils Absolute: 0.1 10*3/uL (ref 0.0–0.1)
Basophils Relative: 1 %
Eosinophils Absolute: 0.2 10*3/uL (ref 0.0–0.5)
Eosinophils Relative: 1 %
HCT: 34.9 % — ABNORMAL LOW (ref 39.0–52.0)
Hemoglobin: 10.7 g/dL — ABNORMAL LOW (ref 13.0–17.0)
Immature Granulocytes: 6 %
Lymphocytes Relative: 13 %
Lymphs Abs: 2.8 10*3/uL (ref 0.7–4.0)
MCH: 23.4 pg — ABNORMAL LOW (ref 26.0–34.0)
MCHC: 30.7 g/dL (ref 30.0–36.0)
MCV: 76.2 fL — ABNORMAL LOW (ref 80.0–100.0)
Monocytes Absolute: 1.8 10*3/uL — ABNORMAL HIGH (ref 0.1–1.0)
Monocytes Relative: 9 %
Neutro Abs: 14.8 10*3/uL — ABNORMAL HIGH (ref 1.7–7.7)
Neutrophils Relative %: 70 %
Platelets: 321 10*3/uL (ref 150–400)
RBC: 4.58 MIL/uL (ref 4.22–5.81)
RDW: 16.5 % — ABNORMAL HIGH (ref 11.5–15.5)
WBC: 21 10*3/uL — ABNORMAL HIGH (ref 4.0–10.5)
nRBC: 0 % (ref 0.0–0.2)

## 2019-06-03 LAB — BASIC METABOLIC PANEL
Anion gap: 15 (ref 5–15)
BUN: 124 mg/dL — ABNORMAL HIGH (ref 6–20)
CO2: 25 mmol/L (ref 22–32)
Calcium: 9.1 mg/dL (ref 8.9–10.3)
Chloride: 104 mmol/L (ref 98–111)
Creatinine, Ser: 3.22 mg/dL — ABNORMAL HIGH (ref 0.61–1.24)
GFR calc Af Amer: 23 mL/min — ABNORMAL LOW (ref >60)
GFR calc non Af Amer: 20 mL/min — ABNORMAL LOW (ref >60)
Glucose, Bld: 166 mg/dL — ABNORMAL HIGH (ref 70–99)
Potassium: 3.1 mmol/L — ABNORMAL LOW (ref 3.5–5.1)
Sodium: 144 mmol/L (ref 135–145)

## 2019-06-03 LAB — MAGNESIUM: Magnesium: 2.4 mg/dL (ref 1.7–2.4)

## 2019-06-03 LAB — PHOSPHORUS: Phosphorus: 4.1 mg/dL (ref 2.5–4.6)

## 2019-06-03 LAB — GLUCOSE, CAPILLARY
Glucose-Capillary: 127 mg/dL — ABNORMAL HIGH (ref 70–99)
Glucose-Capillary: 129 mg/dL — ABNORMAL HIGH (ref 70–99)
Glucose-Capillary: 137 mg/dL — ABNORMAL HIGH (ref 70–99)
Glucose-Capillary: 138 mg/dL — ABNORMAL HIGH (ref 70–99)
Glucose-Capillary: 154 mg/dL — ABNORMAL HIGH (ref 70–99)
Glucose-Capillary: 205 mg/dL — ABNORMAL HIGH (ref 70–99)

## 2019-06-03 MED ORDER — AMIODARONE HCL 200 MG PO TABS
400.0000 mg | ORAL_TABLET | Freq: Two times a day (BID) | ORAL | Status: AC
  Administered 2019-06-03 – 2019-06-09 (×14): 400 mg via ORAL
  Filled 2019-06-03 (×14): qty 2

## 2019-06-03 MED ORDER — POTASSIUM CHLORIDE 10 MEQ/50ML IV SOLN
10.0000 meq | INTRAVENOUS | Status: AC
  Administered 2019-06-03 (×4): 10 meq via INTRAVENOUS
  Filled 2019-06-03 (×4): qty 50

## 2019-06-03 MED ORDER — FUROSEMIDE 10 MG/ML IJ SOLN
80.0000 mg | Freq: Once | INTRAMUSCULAR | Status: AC
  Administered 2019-06-03: 12:00:00 80 mg via INTRAVENOUS
  Filled 2019-06-03: qty 8

## 2019-06-03 MED ORDER — AMIODARONE HCL 200 MG PO TABS
200.0000 mg | ORAL_TABLET | Freq: Every day | ORAL | Status: DC
  Administered 2019-06-10 – 2019-06-12 (×3): 200 mg via ORAL
  Filled 2019-06-03 (×3): qty 1

## 2019-06-03 MED ORDER — PANTOPRAZOLE SODIUM 40 MG PO PACK
40.0000 mg | PACK | Freq: Every day | ORAL | Status: DC
  Administered 2019-06-04 – 2019-06-12 (×9): 40 mg
  Filled 2019-06-03 (×9): qty 20

## 2019-06-03 MED ORDER — CLONAZEPAM 0.5 MG PO TBDP
2.0000 mg | ORAL_TABLET | Freq: Two times a day (BID) | ORAL | Status: DC
  Administered 2019-06-03 – 2019-06-12 (×18): 2 mg
  Filled 2019-06-03 (×2): qty 4
  Filled 2019-06-03 (×2): qty 8
  Filled 2019-06-03 (×9): qty 4
  Filled 2019-06-03: qty 8
  Filled 2019-06-03: qty 4
  Filled 2019-06-03: qty 8
  Filled 2019-06-03 (×2): qty 4

## 2019-06-03 NOTE — Progress Notes (Signed)
Able to reach [name] patient's son. He was woken up from sleep.  Dr. Lynetta Mare was given phone and he provided update to son about the patient being off of pressors and his renal function improving, but him having myoclonus and a poor neurological recovery. No change in code status was made.   Dr. Lynetta Mare recommended that palliative care call patient at a later time after he was more awake.

## 2019-06-03 NOTE — Consult Note (Addendum)
[facility] Nurse Consult Note: Reason for Consult: Consult requested for left arm.  Performed remotely after review of the progress notes and photos in the EMR.  Pt has developed generalized edema and several clear fluid filled blisters to left hand and arm. Some have ruptured and evolved into partial thickness wounds, red and moist, with loose peeling skin surrounding.  Dressing procedure/placement/frequency: Topical treatment orders provided for bedside nurses to perform daily as follows to promote moist healing: Apply Xeroform gauze to left arm/hand Q day, then cover with ABD pads and kerlex. Please re-consult if further assistance is needed.  Thank-you,  Julien Girt MSN, [place], [place], [place], [place]

## 2019-06-03 NOTE — Progress Notes (Signed)
[facility] Progress Note Patient Name: Nijay Lansing DOB: Apr 30, 1961 MRN: [id number]   Date of Service  06/03/2019  HPI/Events of Note  K+ 3.1 Pt is being diuresed aggressively  eICU Interventions  Kcl 10 meq iv Q 1 hour x 4        Fortune Torosian U Ephriam Turman 06/03/2019, 4:33 AM

## 2019-06-03 NOTE — Progress Notes (Signed)
PULMONARY / CRITICAL CARE MEDICINE   NAME:  Xavier Doyle, MRN:  [id number], DOB:  11/03/1960, LOS: 55 ADMISSION DATE:  05/27/2019, CONSULTATION DATE: 05/19/2019 REFERRING MD: Family medicine teaching service, CHIEF COMPLAINT: Cardiac arrest  BRIEF HISTORY:    Mr. Bland is a 59 year old male with hypertension, hyperlipidemia, asthma, depression who was admitted on 05/11/2019 for chest pain.  Patient was found to have newly diagnosed systolic heart failure with EF of 25-30% with global hypokinesis.  Left and right heart catheterization did not show any ischemic cardiac disease.  Patient's chest pain was thought to be due to hypertensive endorgan damage.  During hospitalization patient had a cardiac arrest.  He was found to be in torsades which converted into PEA.  Patient received epi x3, amiodarone, bicarb x2, defibrillation.  ROSC was obtained and patient was started on TTM along with dopamine.   SIGNIFICANT PAST MEDICAL HISTORY    Allergy, Anxiety, Arthritis, Asthma, Depression, Heart murmur, Hyperlipidemia, Hypertension, and Osteoporosis.  SIGNIFICANT EVENTS:   1/17 admitted to [facility] for chest pain 1/19 V. 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 The left ventricle has severely decreased function. There is moderately increased left ventricular hypertrophy. 2. Left ventricular diastolic parameters are consistent with Grade I diastolic dysfunction (impaired  relaxation). 3. The left ventricle demonstrates global hypokinesis. 4. Global right ventricle has mildly reduced systolic function.The right ventricular size is normal. No increase in right ventricular wall thickness. 5. Left atrial size was mildly dilated. 6. Right atrial size was mildly dilated. 7. The mitral valve is normal in structure. Mild mitral valve regurgitation. No evidence of mitral stenosis. 8. The tricuspid valve is normal in structure. Tricuspid valve regurgitation is not demonstrated. 9. The aortic valve is tricuspid. Aortic valve regurgitation is mild. Mild aortic valve sclerosis without stenosis. 10. The inferior vena cava is normal in size with greater than 50% respiratory variability, suggesting right atrial pressure of 3 mmHg. 11. TR signal is inadequate for assessing pulmonary artery systolic pressure.  TTE 05/24/2018  Left ventricular ejection fraction, by visual estimation, is 25 to 30%. The left ventricle has  severely decreased function. There is mildly increased left ventricular hypertrophy. 2. Left ventricular diastolic parameters are consistent with Grade I diastolic dysfunction (impaired relaxation). 3. The left ventricle demonstrates global hypokinesis. 4. Global right ventricle has moderately reduced systolic function.The right ventricular size is normal. No increase in right ventricular wall thickness. 5. Left atrial size was normal. 6. Right atrial size was normal. 7. The mitral valve is normal in structure. Trivial mitral valve regurgitation. No evidence of mitral stenosis. 8. The tricuspid valve is normal in structure. 9. The tricuspid valve is normal in structure. Tricuspid valve regurgitation is trivial. 10. The aortic valve is tricuspid. Aortic valve regurgitation is trivial. No evidence of aortic valve sclerosis or stenosis. 11. The pulmonic valve was normal in structure. Pulmonic valve regurgitation is not visualized. 12. Moderately elevated pulmonary  artery systolic pressure. 13. The tricuspid regurgitant velocity is 2.73  m/s, and with an assumed right atrial pressure of 15 mmHg, the estimated right ventricular systolic pressure is moderately elevated at 44.8 mmHg. 14. The inferior vena cava is dilated in size with <50% respiratory variability, suggesting right atrial pressure of 15 mmHg. 15. No significant change from prior study.  Right/left heart cath 05/30/2019  Mid RCA lesion is 10% stenosed. No significant CAD.  LV end diastolic pressure is moderately elevated. LVEDP 26 mm Hg.  There is no aortic valve stenosis.  Hemodynamic findings consistent with mild pulmonary hypertension.  Ao sat 96%, PA sat 67%, mean PA pressure 29 mm Hg; mean PCWP 20; CO 6.3 L/min, CI 2.72   Medical therapy for nonischemic cardiomyopathy.  CULTURES:   Covid negative Blood culture 1/21 no growth< 24hrs in 2 bottles Respiratory culture 1/22 pending  ANTIBIOTICS:  Vancomycin 1/21>1/22 Cefepime 1/21>  LINES/TUBES:  ETT 1/19 L CVC 1/19  CONSULTANTS:  PCCM Cardiology Palliative care   SUBJECTIVE:  No significant overnight events  CONSTITUTIONAL: BP 118/70   Pulse 84   Temp (!) 97.5 F (36.4 C) (Esophageal)   Resp (!) 30   Ht 5\' 8"  (1.727 m)   Wt 123.8 kg   SpO2 95%   BMI 41.50 kg/m   I/O last 3 completed shifts: In: 3172.9 [I.V.:2622.6; NG/GT:100; IV Piggyback:450.3] Out: [id number] [Urine:6170; Stool:275]  CVP:  [10 mmHg-14 mmHg] 14 mmHg  Vent Mode: PRVC FiO2 (%):  [60 %-80 %] 60 % Set Rate:  [30 bmp] 30 bmp Vt Set:  [540 mL] 540 mL PEEP:  [10 cmH20] 10 cmH20 Plateau Pressure:  [24 [id number] cmH20] 27 cmH20  PHYSIAM: General: appears well, sedated Neuro: unequal pupils that are reactive, corneal reflex intact HEENT: et tube in place Cardiovascular: rrr, s1 and s2 audible Lungs: no rales or rhonchi audible Abdomen: distended Musculoskeletal: no significant pitting edema Skin: blisters on left forearm, warm  extremities  ASSESSMENT AND PLAN    Cardiogenic shock secondary to V. fib arrest on 05/10/2019 s/p TTM completion on 1/23 HFrEF (EF 25-30% G1DD)  Was changed from iv lasix to intermittent lasix and has still put out approximately 4L urine over the past 24 hrs. CVP 12. Patient is off of pressors.   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 Due to the extensive neurological injury the patient has a poor prognosis. Several attempts have been made over the past 2-3 days to contact the patient's son Mr. Marsh Dolly. Ethics committee recommended continued efforts to do due diligence.   -titrate versed  -titrate valproate  -continue eeg monitoring  -Continue ventilatory support -keppra for seizure precaution -Continue to do frequent neurological checks -blood culture no growth 2days  -urine culture cancelled due to no nitrite or leukocytes on ua -Respiratory culture growing gram positive cocci  -ua neg nitrite, leukocytes, wbc 11-20, rare bacteria   Acute hypoxemic respiratory failure Hospital acquired/Aspiration Pneumonia Thought to be secondary to cardiac arrest and pulmonary edema. Completed aspiration pneumonia treatment with cefepime.   Respiratory culture 1/22 growing rare staph aureus, group b strep that is resistant to ciprofloxacin and erythromycin.   -Elevate head of bed -VAP protocol  Cardiorenal syndrome Creatinine improving- 3.46>3.22. Potassium 3.1 due to  diuresis.   -kcl 10mg  x4  Diabetes mellitus  A1C 6.4. Glucose ranging 120-170s   -continue levemir 5u qd  Best Practice / Goals of Care / Disposition.   DVT  PROPHYLAXIS: heparin sq SUP: protonix NUTRITION: npo MOBILITY: bedrest GOALS OF CARE: full FAMILY DISCUSSIONS: team will call patient's son DISPOSITION ICU  LABS  Glucose Recent Labs  Lab 06/02/19 0829 06/02/19 1235 06/02/19 1602 06/02/19 1939 06/02/19 2337 06/03/19 0326  GLUCAP 147* 126* 141* 131* 151* 127*    BMET Recent Labs  Lab 06/02/19 0714 06/02/19 1908 06/03/19 0258  NA 142 144 144  K 3.0* 3.4* 3.1*  CL 102 104 104  CO2 26 25 25   BUN 120* 121* 124*  CREATININE 3.56* 3.46* 3.22*  GLUCOSE 153* 160* 166*    Liver Enzymes Recent Labs  Lab 05/30/19 0401 06/01/19 0423  AST 101* 87*  ALT 57* 61*  ALKPHOS 78 97  BILITOT 0.6 0.4  ALBUMIN 1.8* 2.1*    Electrolytes Recent Labs  Lab 05/29/19 0344 05/30/19 0401 05/31/19 0359 06/01/19 0423 06/02/19 0714 06/02/19 1908 06/03/19 0258  CALCIUM 7.5*   < > 8.9   < > 9.2 9.4 9.1  MG 1.6*   < > 1.9  --  2.2  --  2.4  PHOS 4.2  --   --   --  4.6  --  4.1   < > = values in this interval not displayed.    CBC Recent Labs  Lab 06/01/19 0423 06/02/19 0336 06/03/19 0258  WBC 13.1* 15.4* 21.0*  HGB 10.2* 10.7* 10.7*  HCT 32.1* 34.5* 34.9*  PLT 236 270 321    ABG Recent Labs  Lab 05/28/19 0753 05/29/19 0328 05/29/19 1050  PHART 7.266* 7.322* 7.436  PCO2ART 39.2 38.9 26.7*  PO2ART 162.0* 148.0* 94.7    Coag's No results for input(s): APTT, INR in the last 168 hours.  Sepsis Markers No results for input(s): LATICACIDVEN, PROCALCITON, O2SATVEN in the last 168 hours.  Cardiac Enzymes No results for input(s): TROPONINI, PROBNP in the last 168 hours.  REVIEW OF SYSTEMS:    Patient is intubated and sedated

## 2019-06-03 NOTE — Progress Notes (Signed)
FPTS Social Progress Note   FPTS following along socially. We appreciate the care provided by CCM. FPTS will resume care when/if the patient is appriopriate for transfer out to floor.   [name], DO PGY-1, [facility] 06/03/2019 7:22 AM    - Please contact intern pager [phone] as needed

## 2019-06-04 DIAGNOSIS — G931 Anoxic brain damage, not elsewhere classified: Secondary | ICD-10-CM

## 2019-06-04 DIAGNOSIS — J9621 Acute and chronic respiratory failure with hypoxia: Secondary | ICD-10-CM

## 2019-06-04 LAB — CBC WITH DIFFERENTIAL/PLATELET
Abs Immature Granulocytes: 1.1 10*3/uL — ABNORMAL HIGH (ref 0.00–0.07)
Basophils Absolute: 0.1 10*3/uL (ref 0.0–0.1)
Basophils Relative: 1 %
Eosinophils Absolute: 0.2 10*3/uL (ref 0.0–0.5)
Eosinophils Relative: 1 %
HCT: 36.1 % — ABNORMAL LOW (ref 39.0–52.0)
Hemoglobin: 10.8 g/dL — ABNORMAL LOW (ref 13.0–17.0)
Immature Granulocytes: 5 %
Lymphocytes Relative: 10 %
Lymphs Abs: 2.3 10*3/uL (ref 0.7–4.0)
MCH: 22.9 pg — ABNORMAL LOW (ref 26.0–34.0)
MCHC: 29.9 g/dL — ABNORMAL LOW (ref 30.0–36.0)
MCV: 76.6 fL — ABNORMAL LOW (ref 80.0–100.0)
Monocytes Absolute: 1.7 10*3/uL — ABNORMAL HIGH (ref 0.1–1.0)
Monocytes Relative: 8 %
Neutro Abs: 16.7 10*3/uL — ABNORMAL HIGH (ref 1.7–7.7)
Neutrophils Relative %: 75 %
Platelets: 363 10*3/uL (ref 150–400)
RBC: 4.71 MIL/uL (ref 4.22–5.81)
RDW: 16.7 % — ABNORMAL HIGH (ref 11.5–15.5)
WBC: 22 10*3/uL — ABNORMAL HIGH (ref 4.0–10.5)
nRBC: 0 % (ref 0.0–0.2)

## 2019-06-04 LAB — GLUCOSE, CAPILLARY
Glucose-Capillary: 157 mg/dL — ABNORMAL HIGH (ref 70–99)
Glucose-Capillary: 152 mg/dL — ABNORMAL HIGH (ref 70–99)
Glucose-Capillary: 159 mg/dL — ABNORMAL HIGH (ref 70–99)
Glucose-Capillary: 159 mg/dL — ABNORMAL HIGH (ref 70–99)
Glucose-Capillary: 156 mg/dL — ABNORMAL HIGH (ref 70–99)
Glucose-Capillary: 130 mg/dL — ABNORMAL HIGH (ref 70–99)

## 2019-06-04 LAB — TRIGLYCERIDES: Triglycerides: 182 mg/dL — ABNORMAL HIGH (ref <150)

## 2019-06-04 MED ORDER — INSULIN ASPART 100 UNIT/ML ~~LOC~~ SOLN
0.0000 [IU] | SUBCUTANEOUS | Status: DC
  Administered 2019-06-04 (×4): 2 [IU] via SUBCUTANEOUS
  Administered 2019-06-04: 3 [IU] via SUBCUTANEOUS
  Administered 2019-06-04: 12:00:00 2 [IU] via SUBCUTANEOUS
  Administered 2019-06-05: 21:00:00 1 [IU] via SUBCUTANEOUS
  Administered 2019-06-05: 11:00:00 2 [IU] via SUBCUTANEOUS
  Administered 2019-06-05: 04:00:00 1 [IU] via SUBCUTANEOUS
  Administered 2019-06-05: 08:00:00 2 [IU] via SUBCUTANEOUS
  Administered 2019-06-05 (×2): 1 [IU] via SUBCUTANEOUS
  Administered 2019-06-06 (×2): 2 [IU] via SUBCUTANEOUS
  Administered 2019-06-06 (×2): 1 [IU] via SUBCUTANEOUS
  Administered 2019-06-06: 2 [IU] via SUBCUTANEOUS
  Administered 2019-06-06: 1 [IU] via SUBCUTANEOUS
  Administered 2019-06-07: 12:00:00 3 [IU] via SUBCUTANEOUS
  Administered 2019-06-07 – 2019-06-08 (×5): 1 [IU] via SUBCUTANEOUS
  Administered 2019-06-08: 2 [IU] via SUBCUTANEOUS
  Administered 2019-06-10: 1 [IU] via SUBCUTANEOUS

## 2019-06-04 NOTE — Progress Notes (Signed)
Patient ID: Xavier Doyle, male   DOB: 15-May-1960, 59 y.o.   MRN: [id number]    Attempted to contact son/Kaire Harris. Left another messages to the importance of a call back,  specifically related to his plan to come to see his father and make decisions regarding plan of care and recommended shift to a comfort approach.  Await callback   no charge    PMT will continue to support holistically   Wadie Lessen NP  Palliative Medicine Team Team Phone # [phone] Pager [phone]

## 2019-06-04 NOTE — Progress Notes (Signed)
[facility] Progress Note Patient Name: Xavier Doyle DOB: 08/10/1960 MRN: [id number]   Date of Service  06/04/2019  HPI/Events of Note  Notified of CBG 200s on Levemir 5 units. On tube feedings  eICU Interventions  Start low dose SSI q4. Creatinine 3.22      Intervention Category Major Interventions: Hyperglycemia - active titration of insulin therapy  Shona Needles [place] 06/04/2019, 12:07 AM

## 2019-06-04 NOTE — Progress Notes (Signed)
FPTS Social Progress Note  FPTS following along socially. Appreciate care provided by CCM in the ICU. We will be happy to resume care if/when patient is stable for transfer to floor  Caroline More, DO 06/04/2019, 4:42 AM PGY-3, [facility] pager [phone]

## 2019-06-04 NOTE — Progress Notes (Signed)
PULMONARY / CRITICAL CARE MEDICINE   NAME:  Xavier Doyle, MRN:  [id number], DOB:  08/25/1960, LOS: 44 ADMISSION DATE:  06/04/2019, CONSULTATION DATE: 05/22/2019 REFERRING MD: Family medicine teaching service, CHIEF COMPLAINT: Cardiac arrest  BRIEF HISTORY:    Mr. Haener is a 59 year old male with hypertension, hyperlipidemia, asthma, depression who was admitted on 05/16/2019 for chest pain.  Patient was found to have newly diagnosed systolic heart failure with EF of 25-30% with global hypokinesis.  Left and right heart catheterization did not show any ischemic cardiac disease.  Patient's chest pain was thought to be due to hypertensive endorgan damage.  During hospitalization patient had a cardiac arrest.  He was found to be in torsades which converted into PEA.  Patient received epi x3, amiodarone, bicarb x2, defibrillation.  ROSC was obtained and patient was started on TTM along with dopamine.   SIGNIFICANT PAST MEDICAL HISTORY    Allergy, Anxiety, Arthritis, Asthma, Depression, Heart murmur, Hyperlipidemia, Hypertension, and Osteoporosis.  SIGNIFICANT EVENTS:   1/17 admitted to [facility] for chest pain 1/19 V. 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 The left ventricle has severely decreased function. There is moderately increased left ventricular hypertrophy. 2. Left ventricular diastolic parameters are consistent with Grade I diastolic dysfunction (impaired  relaxation). 3. The left ventricle demonstrates global hypokinesis. 4. Global right ventricle has mildly reduced systolic function.The right ventricular size is normal. No increase in right ventricular wall thickness. 5. Left atrial size was mildly dilated. 6. Right atrial size was mildly dilated. 7. The mitral valve is normal in structure. Mild mitral valve regurgitation. No evidence of mitral stenosis. 8. The tricuspid valve is normal in structure. Tricuspid valve regurgitation is not demonstrated. 9. The aortic valve is tricuspid. Aortic valve regurgitation is mild. Mild aortic valve sclerosis without stenosis. 10. The inferior vena cava is normal in size with greater than 50% respiratory variability, suggesting right atrial pressure of 3 mmHg. 11. TR signal is inadequate for assessing pulmonary artery systolic pressure.  TTE 05/24/2018  Left ventricular ejection fraction, by visual estimation, is 25 to 30%. The left ventricle has  severely decreased function. There is mildly increased left ventricular hypertrophy. 2. Left ventricular diastolic parameters are consistent with Grade I diastolic dysfunction (impaired relaxation). 3. The left ventricle demonstrates global hypokinesis. 4. Global right ventricle has moderately reduced systolic function.The right ventricular size is normal. No increase in right ventricular wall thickness. 5. Left atrial size was normal. 6. Right atrial size was normal. 7. The mitral valve is normal in structure. Trivial mitral valve regurgitation. No evidence of mitral stenosis. 8. The tricuspid valve is normal in structure. 9. The tricuspid valve is normal in structure. Tricuspid valve regurgitation is trivial. 10. The aortic valve is tricuspid. Aortic valve regurgitation is trivial. No evidence of aortic valve sclerosis or stenosis. 11. The pulmonic valve was normal in structure. Pulmonic valve regurgitation is not visualized. 12. Moderately elevated pulmonary  artery systolic pressure. 13. The tricuspid regurgitant velocity is 2.73  m/s, and with an assumed right atrial pressure of 15 mmHg, the estimated right ventricular systolic pressure is moderately elevated at 44.8 mmHg. 14. The inferior vena cava is dilated in size with <50% respiratory variability, suggesting right atrial pressure of 15 mmHg. 15. No significant change from prior study.  Right/left heart cath 05/08/2019  Mid RCA lesion is 10% stenosed. No significant CAD.  LV end diastolic pressure is moderately elevated. LVEDP 26 mm Hg.  There is no aortic valve stenosis.  Hemodynamic findings consistent with mild pulmonary hypertension.  Ao sat 96%, PA sat 67%, mean PA pressure 29 mm Hg; mean PCWP 20; CO 6.3 L/min, CI 2.72   Medical therapy for nonischemic cardiomyopathy.  CULTURES:   Covid negative Blood culture 1/21>> negative Respiratory culture 1/22 >> MSSA  ANTIBIOTICS:  Vancomycin 1/21>1/22 Cefepime 1/21> off  LINES/TUBES:  ETT 1/19 L CVC 1/19  CONSULTANTS:  PCCM Cardiology Palliative care   SUBJECTIVE:  No significant interval change Sedation turned off this morning, has been off for about 2 hours and patient is having myoclonic jerking of the face, shoulders  CONSTITUTIONAL: BP 123/80   Pulse 91   Temp 99 F (37.2 C) (Core)   Resp (!) 30   Ht 5\' 8"  (1.727 m)   Wt 122.8 kg   SpO2 98%   BMI 41.16 kg/m   I/O last 3 completed shifts: In: 3315.4 [I.V.:1698.9; NG/GT:1110; IV Piggyback:506.5] Out: 3620 [Urine:3245; Stool:375]  CVP:  [12 mmHg-19 mmHg] 19 mmHg  Vent Mode: PRVC FiO2 (%):  [40 %-60 %] 40 % Set Rate:  [30 bmp] 30 bmp Vt Set:  [540 mL] 540 mL PEEP:  [8 cmH20-10 cmH20] 8 cmH20 Plateau Pressure:  [23 [id number] cmH20] 26 cmH20  PHYSIAM: General: Chronically ill-appearing man, ventilated Neuro: Completely unresponsive to voice, pain, any stimulus.  Intermittent myoclonus of his face, shoulders HEENT: ET tube in place, some clear oral  secretions Cardiovascular: Regular, distant, no murmur Lungs: Coarse bilaterally, no wheezes or crackles Abdomen: Somewhat distended, hypoactive bowel sounds Musculoskeletal: Trace pretibial edema Skin: No rash  ASSESSMENT AND PLAN    Cardiogenic shock secondary to V. fib arrest on 06/05/2019 s/p TTM completion on 1/23 HFrEF (EF 25-30% G1DD)  Hemodynamically improved Continue amiodarone Dosing Lasix daily, -3.7 L total.  Defer Lasix 1/30 and follow hemodynamics, renal function Atorvastatin as ordered  Acute encephalopathy  Stimulation induced myoclonus vs subcortical myoclonus  Patient continues to have myoclonus that is likely a result of hypoxic injury. Due to the extensive neurological injury the patient has a poor prognosis. Several attempts have been made over the past 2-3 days to contact the patient's son Mr. Marsh Dolly. Ethics committee recommended continued efforts to do due diligence.  Try to minimize Versed and fentanyl as able depending on degree of myoclonus.  He has not exhibited any improvement in neurological function Continue Keppra and valproate, seizure precautions Clonazepam twice daily  Acute hypoxemic respiratory failure, at this juncture principally due to encephalopathy MSSA Hospital acquired/Aspiration Pneumonia Full course cefepime completed Pulmonary hygiene  Acute renal failure due to cardiorenal syndrome Renal function, urine output continue to improve Defer Lasix 1/30, plan to dose daily  Diabetes mellitus  A1C 6.4. Glucose ranging 120-170s Sliding-scale insulin sensitive scale, Levemir 5 units daily   Best Practice / Goals of Care / Disposition.   DVT PROPHYLAXIS: heparin sq SUP: protonix NUTRITION: npo MOBILITY: bedrest GOALS OF CARE: full FAMILY DISCUSSIONS: Discussions ongoing with patient's son, palliative care and communication with him as well DISPOSITION  ICU  LABS  Glucose Recent Labs  Lab 06/03/19 1159 06/03/19 1530  06/03/19 1931 06/03/19 2349 06/04/19 0337 06/04/19 0732  GLUCAP 154* 129* 137* 205* 157* 152*    BMET Recent Labs  Lab 06/02/19 0714 06/02/19 1908 06/03/19 0258  NA 142 144 144  K 3.0* 3.4* 3.1*  CL 102 104 104  CO2 26 25 25   BUN 120* 121* 124*  CREATININE 3.56* 3.46* 3.22*  GLUCOSE 153* 160* 166*    Liver Enzymes Recent Labs  Lab 05/30/19 0401 06/01/19 0423  AST 101* 87*  ALT 57* 61*  ALKPHOS 78 97  BILITOT 0.6 0.4  ALBUMIN 1.8* 2.1*    Electrolytes Recent Labs  Lab 05/29/19 0344 05/30/19 0401 05/31/19 0359 06/01/19 0423 06/02/19 0714 06/02/19 1908 06/03/19 0258  CALCIUM 7.5*   < > 8.9   < > 9.2 9.4 9.1  MG 1.6*   < > 1.9  --  2.2  --  2.4  PHOS 4.2  --   --   --  4.6  --  4.1   < > = values in this interval not displayed.    CBC Recent Labs  Lab 06/02/19 0336 06/03/19 0258 06/04/19 0500  WBC 15.4* 21.0* 22.0*  HGB 10.7* 10.7* 10.8*  HCT 34.5* 34.9* 36.1*  PLT 270 321 363    ABG Recent Labs  Lab 05/29/19 0328 05/29/19 1050  PHART 7.322* 7.436  PCO2ART 38.9 26.7*  PO2ART 148.0* 94.7    Coag's No results for input(s): APTT, INR in the last 168 hours.  Sepsis Markers No results for input(s): LATICACIDVEN, PROCALCITON, O2SATVEN in the last 168 hours.  Cardiac Enzymes No results for input(s): TROPONINI, PROBNP in the last 168 hours.   Independent CC time 32 minutes  Baltazar Apo, MD, PhD 06/04/2019, 10:20 AM [facility] [phone] or if no answer [phone]

## 2019-06-05 ENCOUNTER — Inpatient Hospital Stay (HOSPITAL_COMMUNITY): Payer: Medicare Other

## 2019-06-05 DIAGNOSIS — J9600 Acute respiratory failure, unspecified whether with hypoxia or hypercapnia: Secondary | ICD-10-CM

## 2019-06-05 LAB — GLUCOSE, CAPILLARY
Glucose-Capillary: 135 mg/dL — ABNORMAL HIGH (ref 70–99)
Glucose-Capillary: 144 mg/dL — ABNORMAL HIGH (ref 70–99)
Glucose-Capillary: 146 mg/dL — ABNORMAL HIGH (ref 70–99)
Glucose-Capillary: 156 mg/dL — ABNORMAL HIGH (ref 70–99)
Glucose-Capillary: 159 mg/dL — ABNORMAL HIGH (ref 70–99)
Glucose-Capillary: 181 mg/dL — ABNORMAL HIGH (ref 70–99)

## 2019-06-05 LAB — BASIC METABOLIC PANEL
Anion gap: 15 (ref 5–15)
Anion gap: 14 (ref 5–15)
BUN: 114 mg/dL — ABNORMAL HIGH (ref 6–20)
BUN: 107 mg/dL — ABNORMAL HIGH (ref 6–20)
CO2: 24 mmol/L (ref 22–32)
CO2: 24 mmol/L (ref 22–32)
Calcium: 10 mg/dL (ref 8.9–10.3)
Calcium: 9.7 mg/dL (ref 8.9–10.3)
Chloride: 114 mmol/L — ABNORMAL HIGH (ref 98–111)
Chloride: 115 mmol/L — ABNORMAL HIGH (ref 98–111)
Creatinine, Ser: 2.22 mg/dL — ABNORMAL HIGH (ref 0.61–1.24)
Creatinine, Ser: 2.07 mg/dL — ABNORMAL HIGH (ref 0.61–1.24)
GFR calc Af Amer: 37 mL/min — ABNORMAL LOW (ref >60)
GFR calc Af Amer: 40 mL/min — ABNORMAL LOW (ref >60)
GFR calc non Af Amer: 31 mL/min — ABNORMAL LOW (ref >60)
GFR calc non Af Amer: 34 mL/min — ABNORMAL LOW (ref >60)
Glucose, Bld: 173 mg/dL — ABNORMAL HIGH (ref 70–99)
Glucose, Bld: 147 mg/dL — ABNORMAL HIGH (ref 70–99)
Potassium: 3.3 mmol/L — ABNORMAL LOW (ref 3.5–5.1)
Potassium: 3.3 mmol/L — ABNORMAL LOW (ref 3.5–5.1)
Sodium: 153 mmol/L — ABNORMAL HIGH (ref 135–145)
Sodium: 153 mmol/L — ABNORMAL HIGH (ref 135–145)

## 2019-06-05 LAB — CBC WITH DIFFERENTIAL/PLATELET
Abs Immature Granulocytes: 0.75 10*3/uL — ABNORMAL HIGH (ref 0.00–0.07)
Basophils Absolute: 0.1 10*3/uL (ref 0.0–0.1)
Basophils Relative: 0 %
Eosinophils Absolute: 0.2 10*3/uL (ref 0.0–0.5)
Eosinophils Relative: 1 %
HCT: 38.3 % — ABNORMAL LOW (ref 39.0–52.0)
Hemoglobin: 11.5 g/dL — ABNORMAL LOW (ref 13.0–17.0)
Immature Granulocytes: 4 %
Lymphocytes Relative: 11 %
Lymphs Abs: 2.3 10*3/uL (ref 0.7–4.0)
MCH: 23 pg — ABNORMAL LOW (ref 26.0–34.0)
MCHC: 30 g/dL (ref 30.0–36.0)
MCV: 76.6 fL — ABNORMAL LOW (ref 80.0–100.0)
Monocytes Absolute: 1.6 10*3/uL — ABNORMAL HIGH (ref 0.1–1.0)
Monocytes Relative: 8 %
Neutro Abs: 16.6 10*3/uL — ABNORMAL HIGH (ref 1.7–7.7)
Neutrophils Relative %: 76 %
Platelets: 436 10*3/uL — ABNORMAL HIGH (ref 150–400)
RBC: 5 MIL/uL (ref 4.22–5.81)
RDW: 17 % — ABNORMAL HIGH (ref 11.5–15.5)
WBC: 21.5 10*3/uL — ABNORMAL HIGH (ref 4.0–10.5)
nRBC: 0 % (ref 0.0–0.2)

## 2019-06-05 MED ORDER — ALTEPLASE 2 MG IJ SOLR
2.0000 mg | Freq: Once | INTRAMUSCULAR | Status: AC
  Administered 2019-06-05: 2 mg
  Filled 2019-06-05: qty 2

## 2019-06-05 MED ORDER — FREE WATER
200.0000 mL | Status: DC
  Administered 2019-06-05: 08:00:00 200 mL

## 2019-06-05 MED ORDER — POTASSIUM CHLORIDE 20 MEQ PO PACK
40.0000 meq | PACK | Freq: Once | ORAL | Status: AC
  Administered 2019-06-05: 23:00:00 40 meq via NASOGASTRIC
  Filled 2019-06-05: qty 2

## 2019-06-05 MED ORDER — POTASSIUM CHLORIDE 20 MEQ/15ML (10%) PO SOLN
20.0000 meq | Freq: Once | ORAL | Status: AC
  Administered 2019-06-05: 07:00:00 20 meq
  Filled 2019-06-05: qty 15

## 2019-06-05 MED ORDER — FREE WATER
400.0000 mL | Status: DC
  Administered 2019-06-05 – 2019-06-07 (×12): 400 mL

## 2019-06-05 NOTE — Progress Notes (Signed)
FPTS continues to socially follow this patient.  Appreciate care provided by CCM in the ICU.  We will be happy to resume care when he is transferred to the floor.  [place] Constable, D.O.  PGY-2 Family Medicine  06/05/2019 2:05 AM

## 2019-06-05 NOTE — Progress Notes (Signed)
[facility] Progress Note Patient Name: Xavier Doyle DOB: 18-Nov-1960 MRN: [id number]   Date of Service  06/05/2019  HPI/Events of Note  K 3.3, creat 2.0 K was 3.3 this AM, was given 20 meq oral potassium  eICU Interventions  Try 40 meq oral potassium x 1     Intervention Category Major Interventions: Electrolyte abnormality - evaluation and management  Margaretmary Lombard 06/05/2019, 10:11 PM

## 2019-06-05 NOTE — Plan of Care (Signed)
Pt remains unresponsive. Slight tremor/myoclonic activity on R side, but significantly reduced. Increase in purulent secretions deep in ETT--does affect O2 sats but fixed by suctioning. BP's on high side, giving higher dose of fentanyl to help control.   Problem: Clinical Measurements: Goal: Ability to maintain clinical measurements within normal limits will improve Outcome: Progressing Goal: Cardiovascular complication will be avoided Outcome: Progressing   Problem: Nutrition: Goal: Adequate nutrition will be maintained Outcome: Progressing   Problem: Elimination: Goal: Will not experience complications related to urinary retention Outcome: Progressing   Problem: Clinical Measurements: Goal: Respiratory complications will improve Outcome: Not Progressing

## 2019-06-05 NOTE — Progress Notes (Signed)
[facility] Progress Note Patient Name: Xavier Doyle DOB: [birth year] MRN: [id number]   Date of Service  06/05/2019  HPI/Events of Note  Notified of hypokalemia at 3.3 and hypernatremia at 153.  Crea 2.22 <-- 3.22.  eICU Interventions  Replete K.  Start on free water boluses.       Intervention Category Intermediate Interventions: Electrolyte abnormality - evaluation and management  Elsie Lincoln 06/05/2019, 6:29 AM

## 2019-06-05 NOTE — Progress Notes (Signed)
PULMONARY / CRITICAL CARE MEDICINE   NAME:  Xavier Doyle, MRN:  [id number], DOB:  1960/05/18, LOS: 12 ADMISSION DATE:  05/12/2019, CONSULTATION DATE: 05/13/2019 REFERRING MD: Family medicine teaching service, CHIEF COMPLAINT: Cardiac arrest  BRIEF HISTORY:    Xavier Doyle is a 59 year old male with hypertension, hyperlipidemia, asthma, depression who was admitted on 05/07/2019 for chest pain.  Patient was found to have newly diagnosed systolic heart failure with EF of 25-30% with global hypokinesis.  Left and right heart catheterization did not show any ischemic cardiac disease.  Patient's chest pain was thought to be due to hypertensive endorgan damage.  During hospitalization patient had a cardiac arrest.  He was found to be in torsades which converted into PEA.  Patient received epi x3, amiodarone, bicarb x2, defibrillation.  ROSC was obtained and patient was started on TTM along with dopamine.   SIGNIFICANT PAST MEDICAL HISTORY    Allergy, Anxiety, Arthritis, Asthma, Depression, Heart murmur, Hyperlipidemia, Hypertension, and Osteoporosis.  SIGNIFICANT EVENTS:   1/17 admitted to [facility] for chest pain 1/19 V. 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 The left ventricle has severely decreased function. There is moderately increased left ventricular hypertrophy. 2. Left ventricular diastolic parameters are consistent with Grade I diastolic dysfunction (impaired  relaxation). 3. The left ventricle demonstrates global hypokinesis. 4. Global right ventricle has mildly reduced systolic function.The right ventricular size is normal. No increase in right ventricular wall thickness. 5. Left atrial size was mildly dilated. 6. Right atrial size was mildly dilated. 7. The mitral valve is normal in structure. Mild mitral valve regurgitation. No evidence of mitral stenosis. 8. The tricuspid valve is normal in structure. Tricuspid valve regurgitation is not demonstrated. 9. The aortic valve is tricuspid. Aortic valve regurgitation is mild. Mild aortic valve sclerosis without stenosis. 10. The inferior vena cava is normal in size with greater than 50% respiratory variability, suggesting right atrial pressure of 3 mmHg. 11. TR signal is inadequate for assessing pulmonary artery systolic pressure.  TTE 05/24/2018  Left ventricular ejection fraction, by visual estimation, is 25 to 30%. The left ventricle has  severely decreased function. There is mildly increased left ventricular hypertrophy. 2. Left ventricular diastolic parameters are consistent with Grade I diastolic dysfunction (impaired relaxation). 3. The left ventricle demonstrates global hypokinesis. 4. Global right ventricle has moderately reduced systolic function.The right ventricular size is normal. No increase in right ventricular wall thickness. 5. Left atrial size was normal. 6. Right atrial size was normal. 7. The mitral valve is normal in structure. Trivial mitral valve regurgitation. No evidence of mitral stenosis. 8. The tricuspid valve is normal in structure. 9. The tricuspid valve is normal in structure. Tricuspid valve regurgitation is trivial. 10. The aortic valve is tricuspid. Aortic valve regurgitation is trivial. No evidence of aortic valve sclerosis or stenosis. 11. The pulmonic valve was normal in structure. Pulmonic valve regurgitation is not visualized. 12. Moderately elevated pulmonary  artery systolic pressure. 13. The tricuspid regurgitant velocity is 2.73  m/s, and with an assumed right atrial pressure of 15 mmHg, the estimated right ventricular systolic pressure is moderately elevated at 44.8 mmHg. 14. The inferior vena cava is dilated in size with <50% respiratory variability, suggesting right atrial pressure of 15 mmHg. 15. No significant change from prior study.  Right/left heart cath 05/12/2019  Mid RCA lesion is 10% stenosed. No significant CAD.  LV end diastolic pressure is moderately elevated. LVEDP 26 mm Hg.  There is no aortic valve stenosis.  Hemodynamic findings consistent with mild pulmonary hypertension.  Ao sat 96%, PA sat 67%, mean PA pressure 29 mm Hg; mean PCWP 20; CO 6.3 L/min, CI 2.72   Medical therapy for nonischemic cardiomyopathy.  CULTURES:   Covid negative Blood culture 1/21>> negative Respiratory culture 1/22 >> MSSA  ANTIBIOTICS:  Vancomycin 1/21>1/22 Cefepime 1/21> off  LINES/TUBES:  ETT 1/19 L CVC 1/19  CONSULTANTS:  PCCM Cardiology Palliative care   SUBJECTIVE:  No significant changes overnight He was off of sedation for much of the day 1/30, had corresponding increase in his myoclonic jerking.  Fentanyl and Versed infusions now restarted Note that palliative care attempted unsuccessfully to reach patient's son Xavier Doyle.  I was unable to reach him either Urine output no more than 200 cc/h, about 2000 cc last 24 hours.  Note evolving hypernatremia  CONSTITUTIONAL: BP 136/85   Pulse 93   Temp 98.2 F (36.8 C) (Core)   Resp (!) 30   Ht 5\' 8"  (1.727 m)   Wt 121.5 kg   SpO2 95%   BMI 40.73 kg/m   I/O last 3 completed shifts: In: 2600 [I.V.:755.2; NG/GT:1645; IV Piggyback:199.8] Out: [id number] [Urine:2465; Stool:1150]  CVP:  [12 mmHg-21 mmHg] 16 mmHg  Vent Mode: PRVC FiO2 (%):  [40 %] 40 % Set Rate:  [30 bmp] 30 bmp Vt Set:  [540 mL] 540 mL PEEP:  [5 cmH20-8 cmH20] 5 cmH20 Plateau Pressure:  [23 cmH20-26 cmH20] 24  cmH20  PHYSIAM: General: Chronically ill-appearing man, ventilated Neuro: Completely unresponsive to voice, pain, any stimulus.  I do not see any myoclonus today, back on sedation HEENT: ET tube appears to be in good position, some clear oral secretions Cardiovascular: Distant, regular Lungs: Coarse bilateral breath sounds Abdomen: Mild distention, hypoactive bowel sounds Musculoskeletal: Trace pretibial edema Skin: No rash  ASSESSMENT AND PLAN    Cardiogenic shock secondary to V. fib arrest on 05/30/2019 s/p TTM completion on 1/23 HFrEF (EF 25-30% G1DD)  Plan to continue amiodarone.  Hemodynamically improved Remains net negative, holding off on any further diuresis for now Atorvastatin as ordered  Acute encephalopathy  Stimulation induced myoclonus vs subcortical myoclonus  Patient continues to have myoclonus that is likely a result of hypoxic injury. Due to the extensive neurological injury the patient has a poor prognosis.  Have continued to try to reach patient's son Xavier Doyle. will try through the weekend but suspect we will need to revisit with ethics committee on 2/1. Continue stable dose Versed, fentanyl Continue Keppra, valproate.  No evident seizure activity noted.  Precautions in place. Clonazepam twice daily  Acute hypoxemic respiratory failure, at this juncture principally due to encephalopathy MSSA Hospital acquired/Aspiration Pneumonia Full course cefepime completed Continue pulmonary hygiene Follow intermittent chest x-ray  Acute renal failure due to cardiorenal syndrome, serum creatinine improving.  Stable urine output Continue to follow BMP, urine output.  Now appears to be auto diuresing, defer Lasix  Hypernatremia, significant increase over last 24 hours with continued urine output.  High risk for  DI given his brain injury Increase free water now.  May need to start D5W to account for free water losses if this actually is DI  Diabetes mellitus  A1C 6.4.  Glucose ranging 120-170s Continue sliding-scale insulin sensitive scale, Levemir 5 units daily   Best Practice / Goals of Care / Disposition.   DVT PROPHYLAXIS: heparin sq SUP: protonix NUTRITION: npo MOBILITY: bedrest GOALS OF CARE: full FAMILY DISCUSSIONS: Discussions ongoing with patient's son, palliative care and communication with him as well. I was unable to reach him on 1/30 or 1/31, left message.  DISPOSITION ICU  LABS  Glucose Recent Labs  Lab 06/04/19 1116 06/04/19 1528 06/04/19 1926 06/04/19 2315 06/05/19 0348 06/05/19 0739  GLUCAP 159* 159* 156* 130* 146* 156*    BMET Recent Labs  Lab 06/02/19 1908 06/03/19 0258 06/05/19 0522  NA 144 144 153*  K 3.4* 3.1* 3.3*  CL 104 104 114*  CO2 25 25 24   BUN 121* 124* 114*  CREATININE 3.46* 3.22* 2.22*  GLUCOSE 160* 166* 173*    Liver Enzymes Recent Labs  Lab 05/30/19 0401 06/01/19 0423  AST 101* 87*  ALT 57* 61*  ALKPHOS 78 97  BILITOT 0.6 0.4  ALBUMIN 1.8* 2.1*    Electrolytes Recent Labs  Lab 05/31/19 0359 06/01/19 0423 06/02/19 0714 06/02/19 0714 06/02/19 1908 06/03/19 0258 06/05/19 0522  CALCIUM 8.9   < > 9.2   < > 9.4 9.1 10.0  MG 1.9  --  2.2  --   --  2.4  --   PHOS  --   --  4.6  --   --  4.1  --    < > = values in this interval not displayed.    CBC Recent Labs  Lab 06/03/19 0258 06/04/19 0500 06/05/19 0522  WBC 21.0* 22.0* 21.5*  HGB 10.7* 10.8* 11.5*  HCT 34.9* 36.1* 38.3*  PLT 321 363 436*    ABG Recent Labs  Lab 05/29/19 1050  PHART 7.436  PCO2ART 26.7*  PO2ART 94.7    Coag's No results for input(s): APTT, INR in the last 168 hours.  Sepsis Markers No results for input(s): LATICACIDVEN, PROCALCITON, O2SATVEN in the last 168 hours.  Cardiac Enzymes No results for input(s): TROPONINI, PROBNP in the last 168 hours.   Independent CC time 31 minutes  Baltazar Apo, MD, PhD 06/05/2019, 9:30 AM [facility] [phone] or if no answer  [phone]

## 2019-06-05 NOTE — Plan of Care (Signed)
  Problem: Clinical Measurements: Goal: Ability to maintain clinical measurements within normal limits will improve Outcome: Progressing Goal: Will remain free from infection Outcome: Progressing   Problem: Nutrition: Goal: Adequate nutrition will be maintained Outcome: Progressing   Problem: Elimination: Goal: Will not experience complications related to urinary retention Outcome: Progressing   Problem: Safety: Goal: Ability to remain free from injury will improve Outcome: Progressing   Problem: Clinical Measurements: Goal: Respiratory complications will improve Outcome: Not Progressing   Problem: Elimination: Goal: Will not experience complications related to bowel motility Outcome: Not Progressing

## 2019-06-06 ENCOUNTER — Inpatient Hospital Stay (HOSPITAL_COMMUNITY): Payer: Medicare Other

## 2019-06-06 DIAGNOSIS — R627 Adult failure to thrive: Secondary | ICD-10-CM

## 2019-06-06 LAB — CBC WITH DIFFERENTIAL/PLATELET
Abs Immature Granulocytes: 0.71 10*3/uL — ABNORMAL HIGH (ref 0.00–0.07)
Basophils Absolute: 0.1 10*3/uL (ref 0.0–0.1)
Basophils Relative: 0 %
Eosinophils Absolute: 0.3 10*3/uL (ref 0.0–0.5)
Eosinophils Relative: 1 %
HCT: 35.6 % — ABNORMAL LOW (ref 39.0–52.0)
Hemoglobin: 10.4 g/dL — ABNORMAL LOW (ref 13.0–17.0)
Immature Granulocytes: 3 %
Lymphocytes Relative: 13 %
Lymphs Abs: 3.1 10*3/uL (ref 0.7–4.0)
MCH: 22.8 pg — ABNORMAL LOW (ref 26.0–34.0)
MCHC: 29.2 g/dL — ABNORMAL LOW (ref 30.0–36.0)
MCV: 78.1 fL — ABNORMAL LOW (ref 80.0–100.0)
Monocytes Absolute: 2 10*3/uL — ABNORMAL HIGH (ref 0.1–1.0)
Monocytes Relative: 8 %
Neutro Abs: 17.7 10*3/uL — ABNORMAL HIGH (ref 1.7–7.7)
Neutrophils Relative %: 75 %
Platelets: 421 10*3/uL — ABNORMAL HIGH (ref 150–400)
RBC: 4.56 MIL/uL (ref 4.22–5.81)
RDW: 17.2 % — ABNORMAL HIGH (ref 11.5–15.5)
WBC: 23.8 10*3/uL — ABNORMAL HIGH (ref 4.0–10.5)
nRBC: 0 % (ref 0.0–0.2)

## 2019-06-06 LAB — GLUCOSE, CAPILLARY
Glucose-Capillary: 135 mg/dL — ABNORMAL HIGH (ref 70–99)
Glucose-Capillary: 128 mg/dL — ABNORMAL HIGH (ref 70–99)
Glucose-Capillary: 168 mg/dL — ABNORMAL HIGH (ref 70–99)
Glucose-Capillary: 130 mg/dL — ABNORMAL HIGH (ref 70–99)
Glucose-Capillary: 127 mg/dL — ABNORMAL HIGH (ref 70–99)

## 2019-06-06 LAB — BASIC METABOLIC PANEL
Anion gap: 16 — ABNORMAL HIGH (ref 5–15)
BUN: 99 mg/dL — ABNORMAL HIGH (ref 6–20)
CO2: 24 mmol/L (ref 22–32)
Calcium: 10.2 mg/dL (ref 8.9–10.3)
Chloride: 113 mmol/L — ABNORMAL HIGH (ref 98–111)
Creatinine, Ser: 2.06 mg/dL — ABNORMAL HIGH (ref 0.61–1.24)
GFR calc Af Amer: 40 mL/min — ABNORMAL LOW (ref >60)
GFR calc non Af Amer: 34 mL/min — ABNORMAL LOW (ref >60)
Glucose, Bld: 121 mg/dL — ABNORMAL HIGH (ref 70–99)
Potassium: 3.5 mmol/L (ref 3.5–5.1)
Sodium: 153 mmol/L — ABNORMAL HIGH (ref 135–145)

## 2019-06-06 LAB — MAGNESIUM: Magnesium: 2.3 mg/dL (ref 1.7–2.4)

## 2019-06-06 NOTE — Progress Notes (Signed)
Chaplain prayed over Xavier Doyle to honor family's request to spend time with him.    Chaplain will continue to follow-up.

## 2019-06-06 NOTE — Care Management (Signed)
06-06-19 1440 Case Manager received call from patient's son Zaron Voeltz over the weekend he left a voicemail. Case Manager called son back today and reached his voicemail. Unable to speak with him. Case Manager will continue to follow the patient for transition of care needs. Bethena Roys, RN,BSN Case Manager [phone]

## 2019-06-06 NOTE — Progress Notes (Signed)
Patient ID: Xavier Doyle, male   DOB: [birth year], 59 y.o.   MRN: [id number]  This NP visited patient at the bedside as a follow up  for palliative medicine needs and emotional support.  Was able to speak with Xavier Doyle in [place].  Again we discussed the current medical situation; seriousness of multiple co-morbidites and grim prognosis.  He tells me that his decision is definitely to liberate his father from the ventilator and allowing natural death however he still hopes to be here for that transition of care.  I discussed with [name] the importance of putting some limits to escalation of care in place.  He agrees today to no CPR and no compressions, will document.  I again shared with him the fact that his father's prognosis is grim and that anything could happen at any time.  He reports that he will be here no later than Thursday morning. He reports that his trip is been complicated because of [id number] restrictions.  He has multiple questions regarding the logistics of care after his father's death.  Palliative medicine team nurse to text him a few crematorium  options.  Questions and concerns addressed  Emotional support offered  Discussed with Xavier Doyle via secure chat  and bedside RN  Total time spent on the unit was 40 minutes     PMT will continue to support holistically  Greater than 50% of the time was spent in counseling and coordination of care  Xavier Lessen NP  Palliative Medicine Team Team Phone # [phone] Pager [phone]

## 2019-06-06 NOTE — Progress Notes (Addendum)
PULMONARY / CRITICAL CARE MEDICINE   Name: Xavier Doyle MRN: [id number] DOB: [birth year]    ADMISSION DATE:  05/07/2019 CONSULTATION DATE:  05/16/2019  REFERRING MD:  Family Practice Teaching Service  CHIEF COMPLAINT:  Cardiac Arrest  HISTORY OF PRESENT ILLNESS:   Xavier Doyle is a 59 year old male with hypertension, hyperlipidemia, asthma, depression who was admitted on 05/13/2019 for chest pain.  Patient was found to have newly diagnosed systolic heart failure with EF of 25-30% with global hypokinesis.  Left and right heart catheterization did not show any ischemic cardiac disease.  Patient's chest pain was thought to be due to hypertensive endorgan damage.  During hospitalization patient had a cardiac arrest.  He was found to be in torsades which converted into PEA.  Patient received epi x3, amiodarone, bicarb x2, defibrillation.  ROSC was obtained and patient was started on TTM along with dopamine.   ON EVENT (06/05/2019):  Pt remains unresponsive. Slight tremor/myoclonic activity on R side, but significantly reduced. Increase in purulent secretions deep in ETT--does affect O2 sats but fixed by suctioning. BP's on high side, giving higher dose of fentanyl to help control.  PAST MEDICAL HISTORY :  He  has a past medical history of Allergy, Anxiety, Arthritis, Asthma, Depression, Heart murmur, Hyperlipidemia, Hypertension, and Osteoporosis.  PAST SURGICAL HISTORY: He  has a past surgical history that includes No past surgeries and RIGHT/LEFT HEART CATH AND CORONARY ANGIOGRAPHY (N/A, 05/27/2019).  No Known Allergies  No current facility-administered medications on file prior to encounter.   Current Outpatient Medications on File Prior to Encounter  Medication Sig  . furosemide (LASIX) 40 MG tablet Take 40 mg by mouth daily as needed for fluid.  Marland Kitchen losartan-hydrochlorothiazide (HYZAAR) 100-25 MG tablet Take 1 tablet by mouth daily.   Marland Kitchen OVER THE COUNTER MEDICATION Neutragenix daily  . OVER  THE COUNTER MEDICATION Take 1 tablet by mouth daily. Keto diet pill   . PROAIR HFA 108 (90 Base) MCG/ACT inhaler Inhale 2 puffs into the lungs every 6 (six) hours as needed for wheezing.   . tamsulosin (FLOMAX) 0.4 MG CAPS capsule Take 0.4 mg by mouth.    FAMILY HISTORY:  His He indicated that the status of his neg hx is unknown.   SOCIAL HISTORY: He  reports that he has quit smoking. He has never used smokeless tobacco. He reports current alcohol use. He reports current drug use. Drug: "Crack" cocaine.  REVIEW OF SYSTEMS:   Unable to provide ROS  SUBJECTIVE:  Patient unresponsive.   VITAL SIGNS: BP 138/84   Pulse 88   Temp 98.8 F (37.1 C) (Core)   Resp (!) 30   Ht 5\' 8"  (1.727 m)   Wt 120.6 kg   SpO2 99%   BMI 40.43 kg/m   VENTILATOR SETTINGS: Vent Mode: PRVC FiO2 (%):  [40 %] 40 % Set Rate:  [30 bmp] 30 bmp Vt Set:  [540 mL] 540 mL PEEP:  [5 cmH20] 5 cmH20 Plateau Pressure:  [20 [id number] cmH20] 20 cmH20  INTAKE / OUTPUT: I/O last 3 completed shifts: In: 2528.7 [I.V.:849.6; NG/GT:1280; IV Piggyback:399.1] Out: 4365 [Urine:3505; Stool:860]  PHYSICAL EXAMINATION: General: patient diaphoretic, comatose Neuro: patient with myoclonus to right upper and lower extremity, minimal movement of left extremities Cardiovascular: RRR, HR 92, no murmus appreciated Lungs: patient currently intubated with clear breath sounds bilaterally Abdomen:  Soft, normal bowel sounds appreciated Extremities: 2+ nonpitting edema appreciated to bilateral lower extremities   LABS:  BMET Recent Labs  Lab 06/05/19 0522 06/05/19  1925 06/06/19 0332  NA 153* 153* 153*  K 3.3* 3.3* 3.5  CL 114* 115* 113*  CO2 24 24 24   BUN 114* 107* 99*  CREATININE 2.22* 2.07* 2.06*  GLUCOSE 173* 147* 121*    Electrolytes Recent Labs  Lab 06/02/19 0714 06/02/19 1908 06/03/19 0258 06/03/19 0258 06/05/19 0522 06/05/19 1925 06/06/19 0332  CALCIUM 9.2   < > 9.1   < > 10.0 9.7 10.2  MG 2.2  --   2.4  --   --   --  2.3  PHOS 4.6  --  4.1  --   --   --   --    < > = values in this interval not displayed.    CBC Recent Labs  Lab 06/04/19 0500 06/05/19 0522 06/06/19 0332  WBC 22.0* 21.5* 23.8*  HGB 10.8* 11.5* 10.4*  HCT 36.1* 38.3* 35.6*  PLT 363 436* 421*    Coag's No results for input(s): APTT, INR in the last 168 hours.  Sepsis Markers No results for input(s): LATICACIDVEN, PROCALCITON, O2SATVEN in the last 168 hours.  ABG No results for input(s): PHART, PCO2ART, PO2ART in the last 168 hours.  Liver Enzymes Recent Labs  Lab 06/01/19 0423  AST 87*  ALT 61*  ALKPHOS 97  BILITOT 0.4  ALBUMIN 2.1*    Cardiac Enzymes No results for input(s): TROPONINI, PROBNP in the last 168 hours.  Glucose Recent Labs  Lab 06/05/19 1115 06/05/19 1535 06/05/19 1929 06/05/19 2351 06/06/19 0334 06/06/19 0743  GLUCAP 181* 144* 135* 159* 135* 128*    Imaging DG CHEST PORT 1 VIEW  Result Date: 06/06/2019 CLINICAL DATA:  Status post cardiac arrest. EXAM: PORTABLE CHEST 1 VIEW COMPARISON:  06/05/2019 FINDINGS: The endotracheal tube is 3.8 cm above the carina. The feeding tube is coursing down the esophagus and into the stomach. Improved lung aeration with resolving right lower lobe atelectasis. No pneumothorax. IMPRESSION: Stable support apparatus. Improved lung aeration with resolving right lower lobe atelectasis. Electronically Signed   By: Marijo Sanes M.D.   On: 06/06/2019 06:46     STUDIES:  TTE 05/23/2019 Left ventricular ejection fraction, by visual estimation, is 25 to 30%. The left ventricle has severely decreased function. There is moderately increased left ventricular hypertrophy. 2. Left ventricular diastolic parameters are consistent with Grade I diastolic dysfunction (impaired relaxation). 3. The left ventricle demonstrates global hypokinesis. 4. Global right ventricle has mildly reduced systolic function.The right ventricular size is normal. No increase in  right ventricular wall thickness. 5. Left atrial size was mildly dilated. 6. Right atrial size was mildly dilated. 7. The mitral valve is normal in structure. Mild mitral valve regurgitation. No evidence of mitral stenosis. 8. The tricuspid valve is normal in structure. Tricuspid valve regurgitation is not demonstrated. 9. The aortic valve is tricuspid. Aortic valve regurgitation is mild. Mild aortic valve sclerosis without stenosis. 10. The inferior vena cava is normal in size with greater than 50% respiratory variability, suggesting right atrial pressure of 3 mmHg. 11. TR signal is inadequate for assessing pulmonary artery systolic pressure.  TTE 05/24/2018  Left ventricular ejection fraction, by visual estimation, is 25 to 30%. The left ventricle has  severely decreased function. There is mildly increased left ventricular hypertrophy. 2. Left ventricular diastolic parameters are consistent with Grade I diastolic dysfunction (impaired relaxation). 3. The left ventricle demonstrates global hypokinesis. 4. Global right ventricle has moderately reduced systolic function.The right ventricular size is normal. No increase in right ventricular wall thickness. 5. Left  atrial size was normal. 6. Right atrial size was normal. 7. The mitral valve is normal in structure. Trivial mitral valve regurgitation. No evidence of mitral stenosis. 8. The tricuspid valve is normal in structure. 9. The tricuspid valve is normal in structure. Tricuspid valve regurgitation is trivial. 10. The aortic valve is tricuspid. Aortic valve regurgitation is trivial. No evidence of aortic valve sclerosis or stenosis. 11. The pulmonic valve was normal in structure. Pulmonic valve regurgitation is not visualized. 12. Moderately elevated pulmonary artery systolic pressure. 13. The tricuspid regurgitant velocity is 2.73 m/s, and with an assumed right atrial pressure of 15 mmHg, the estimated right ventricular systolic pressure is  moderately elevated at 44.8 mmHg. 14. The inferior vena cava is dilated in size with <50% respiratory variability, suggesting right atrial pressure of 15 mmHg. 15. No significant change from prior study.  Right/left heart cath 05/10/2019  Mid RCA lesion is 10% stenosed. No significant CAD.  LV end diastolic pressure is moderately elevated. LVEDP 26 mm Hg.  There is no aortic valve stenosis.  Hemodynamic findings consistent with mild pulmonary hypertension.  Ao sat 96%, PA sat 67%, mean PA pressure 29 mm Hg; mean PCWP 20; CO 6.3 L/min, CI 2.72  CULTURES: Covid negative Blood culture 1/21>> negative Respiratory culture 1/22 >> MSSA  ANTIBIOTICS: Vancomycin 1/21>1/22 Cefepime 1/21> off  SIGNIFICANT EVENTS: 1/17 admitted to [facility] for chest pain 1/19 V. fib arrest subsequent to torsades, TTM initiated 1/20 Repeat TTE without new changes  1/21 EEG stopped, Aggressive diuresis with lasix drip and metolazone  1/23 Completed TTM 1/24 Myoclonic activity noted started on versed and placed on eeg 1/25 spoke with patient's son he mentioned that he would like for all care to be resumed till he comes from [place] (tentatatively 1/27) Palliative care discussion  1/26 Total body myoclonus  1/28 titrated off of dobutamine   LINES/TUBES: ETT 1/19 L CVC 1/19  ASSESSMENT / PLAN:  Cardiogenic shock secondary to V. fib arrest on 05/07/2019 s/p TTM completion on 1/23 HFrEF (EF 25-30% G1DD)  - Plan to continue amiodarone 400mg  BID.  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 Due to the extensive neurological injury the patient has a poor prognosis. Expecting hospital death. After speaking with  patient's son/POA on 06/06/2019 it was decided to refocus intervention with goal being comfort care. Son is still planning to travel to [facility] from [place], would like to be present before transitioning patient to full comfort care measures.  - Continue Versed infusion 10mg /hr, Fentanyl 100-359mcg/hr - Continue Valproic Acid 500mg  BID per tube and Keppra 1000mg  BID - Seizure Precautions in place  - Clonazepam Clonazepam 2mg  BID per tube - Oxycodone 5mg  q6 per tube - Quetiapine 25mg  daily  Acute hypoxemic respiratory failure, at this juncture principally due to encephalopathy [facility] Hospital acquired/Aspiration Pneumonia - Full course cefepime completed - Continue pulmonary hygiene - Follow intermittent chest x-ray  Acute renal failure due to cardiorenal syndrome: serum creatinine continues to improve. Continued stable urine output, produced 1.7L output 02/01.  - Monitor BMP, urine output - Defer Lasix as patient is auto-diuresing  Hypernatremia: 144 > 153 06/05/2019, has remain unchanged. Patient continues to produce urine. High risk for DI given his brain injury - Continue free  water per tube - May need to start D5W to account for free water losses if this actually is DI  T2DM: A1c 6.4%. CBG 02/01 128-168. - SSI q4 hours PRN Hyperglycemia - Levemir 10 units daily   DVT Prophylaxis: Heparin 5,000 units q8   FAMILY  - Updates: Palliative Care NP spoke with patient's son Xavier Doyle [phone] work, [phone] cell), agreed to no CPR meds or compressions, will remain intubated, son wishes to be present for transition.   Milus Banister, DO PGY-2, [facility] 06/06/2019 9:06 AM

## 2019-06-06 NOTE — Progress Notes (Signed)
FPTS continues to socially follow this patient.  Appreciate care provided by CCM in the ICU.  We will be happy to resume care when he is transferred to the floor.  Gerlene Fee, D.O.  PGY-1 Family Medicine  06/06/2019 9:49 AM

## 2019-06-06 DEATH — deceased

## 2019-06-07 ENCOUNTER — Inpatient Hospital Stay (HOSPITAL_COMMUNITY): Payer: Medicare Other

## 2019-06-07 LAB — BASIC METABOLIC PANEL
Anion gap: 17 — ABNORMAL HIGH (ref 5–15)
BUN: 80 mg/dL — ABNORMAL HIGH (ref 6–20)
CO2: 20 mmol/L — ABNORMAL LOW (ref 22–32)
Calcium: 9.5 mg/dL (ref 8.9–10.3)
Chloride: 112 mmol/L — ABNORMAL HIGH (ref 98–111)
Creatinine, Ser: 2.14 mg/dL — ABNORMAL HIGH (ref 0.61–1.24)
GFR calc Af Amer: 38 mL/min — ABNORMAL LOW (ref >60)
GFR calc non Af Amer: 33 mL/min — ABNORMAL LOW (ref >60)
Glucose, Bld: 137 mg/dL — ABNORMAL HIGH (ref 70–99)
Potassium: 4.6 mmol/L (ref 3.5–5.1)
Sodium: 149 mmol/L — ABNORMAL HIGH (ref 135–145)

## 2019-06-07 LAB — GLUCOSE, CAPILLARY
Glucose-Capillary: 121 mg/dL — ABNORMAL HIGH (ref 70–99)
Glucose-Capillary: 139 mg/dL — ABNORMAL HIGH (ref 70–99)
Glucose-Capillary: 146 mg/dL — ABNORMAL HIGH (ref 70–99)
Glucose-Capillary: 146 mg/dL — ABNORMAL HIGH (ref 70–99)
Glucose-Capillary: 150 mg/dL — ABNORMAL HIGH (ref 70–99)
Glucose-Capillary: 215 mg/dL — ABNORMAL HIGH (ref 70–99)
Glucose-Capillary: 87 mg/dL (ref 70–99)

## 2019-06-07 LAB — CBC WITH DIFFERENTIAL/PLATELET
Abs Immature Granulocytes: 0.45 10*3/uL — ABNORMAL HIGH (ref 0.00–0.07)
Basophils Absolute: 0.1 10*3/uL (ref 0.0–0.1)
Basophils Relative: 0 %
Eosinophils Absolute: 0.2 10*3/uL (ref 0.0–0.5)
Eosinophils Relative: 1 %
HCT: 34.6 % — ABNORMAL LOW (ref 39.0–52.0)
Hemoglobin: 10.4 g/dL — ABNORMAL LOW (ref 13.0–17.0)
Immature Granulocytes: 1 %
Lymphocytes Relative: 7 %
Lymphs Abs: 2.3 10*3/uL (ref 0.7–4.0)
MCH: 23.1 pg — ABNORMAL LOW (ref 26.0–34.0)
MCHC: 30.1 g/dL (ref 30.0–36.0)
MCV: 76.9 fL — ABNORMAL LOW (ref 80.0–100.0)
Monocytes Absolute: 1.6 10*3/uL — ABNORMAL HIGH (ref 0.1–1.0)
Monocytes Relative: 5 %
Neutro Abs: 27 10*3/uL — ABNORMAL HIGH (ref 1.7–7.7)
Neutrophils Relative %: 86 %
Platelets: 534 10*3/uL — ABNORMAL HIGH (ref 150–400)
RBC: 4.5 MIL/uL (ref 4.22–5.81)
RDW: 17.2 % — ABNORMAL HIGH (ref 11.5–15.5)
WBC: 31.7 10*3/uL — ABNORMAL HIGH (ref 4.0–10.5)
nRBC: 0 % (ref 0.0–0.2)

## 2019-06-07 MED ORDER — SODIUM CHLORIDE 0.9 % IV SOLN
INTRAVENOUS | Status: DC | PRN
  Administered 2019-06-08: 17:00:00 500 mL via INTRAVENOUS

## 2019-06-07 MED ORDER — VANCOMYCIN HCL IN DEXTROSE 1-5 GM/200ML-% IV SOLN
1000.0000 mg | INTRAVENOUS | Status: DC
  Administered 2019-06-08: 13:00:00 1000 mg via INTRAVENOUS
  Filled 2019-06-07: qty 200

## 2019-06-07 MED ORDER — CARVEDILOL 6.25 MG PO TABS
6.2500 mg | ORAL_TABLET | Freq: Two times a day (BID) | ORAL | Status: DC
  Administered 2019-06-07 – 2019-06-12 (×8): 6.25 mg via ORAL
  Filled 2019-06-07 (×9): qty 1

## 2019-06-07 MED ORDER — VANCOMYCIN HCL 10 G IV SOLR
2500.0000 mg | Freq: Once | INTRAVENOUS | Status: AC
  Administered 2019-06-07: 12:00:00 2500 mg via INTRAVENOUS
  Filled 2019-06-07: qty 2500

## 2019-06-07 MED ORDER — TORSEMIDE 20 MG PO TABS
20.0000 mg | ORAL_TABLET | Freq: Every day | ORAL | Status: DC
  Administered 2019-06-07 – 2019-06-10 (×4): 20 mg via ORAL
  Filled 2019-06-07 (×4): qty 1

## 2019-06-07 MED ORDER — VECURONIUM BROMIDE 10 MG IV SOLR
10.0000 mg | Freq: Once | INTRAVENOUS | Status: AC
  Administered 2019-06-07: 10 mg via INTRAVENOUS
  Filled 2019-06-07: qty 10

## 2019-06-07 MED ORDER — FUROSEMIDE 10 MG/ML IJ SOLN
80.0000 mg | Freq: Once | INTRAMUSCULAR | Status: AC
  Administered 2019-06-07: 16:00:00 80 mg via INTRAVENOUS
  Filled 2019-06-07: qty 8

## 2019-06-07 MED ORDER — STERILE WATER FOR INJECTION IJ SOLN
INTRAMUSCULAR | Status: AC
  Filled 2019-06-07: qty 10

## 2019-06-07 MED ORDER — SODIUM CHLORIDE 0.9 % IV SOLN
2.0000 g | Freq: Two times a day (BID) | INTRAVENOUS | Status: DC
  Administered 2019-06-07 – 2019-06-08 (×4): 2 g via INTRAVENOUS
  Filled 2019-06-07 (×6): qty 2

## 2019-06-07 NOTE — Progress Notes (Signed)
Patient's temp was 102.7 Farenheit. Cooling blanket and ice packers were applied. Prn tylenol was given. Elink paged.

## 2019-06-07 NOTE — Procedures (Signed)
Bronchoscopy Procedure Note Xavier Doyle [id number] August 18, 1960  Procedure: Bronchoscopy Indications: Obtain specimens for culture and/or other diagnostic studies and Remove secretions  Procedure Details Consent: Unable to obtain consent because of emergent medical necessity. Time Out: Verified patient identification, verified procedure, site/side was marked, verified correct patient position, special equipment/implants available, medications/allergies/relevent history reviewed, required imaging and test results available.  Performed  In preparation for procedure, patient was given 100% FiO2 and bronchoscope lubricated. Sedation: Benzodiazepines, Muscle relaxants and narcotics  Airway entered and the following bronchi were examined: RUL, RML, RLL, LUL, LLL and Bronchi.   Procedures performed: secretions aspirated. BAL LLL sent for culture. Bronchoscope removed.  , Patient placed back on 100% FiO2 at conclusion of procedure.    Evaluation Hemodynamic Status: BP stable throughout; O2 sats: stable throughout Patient's Current Condition: stable Specimens:  Sent purulent fluid Complications: No apparent complications Patient did tolerate procedure well.   Einar Grad Ruel Dimmick 06/07/2019

## 2019-06-07 NOTE — Progress Notes (Signed)
Late Entry  CCM did bedside bronch this AM. Secretions very closely resembled patient's tube feeds. TF placed on hold until KUB obtained. KUB showed cortrak is post pyloric. Patient continues to cough up what appears to be tube feeds, continues to desat, and continues to have many vent alarms. Dr. Lynetta Mare stated to stop tube feeds for now and will reevaluate tomorrow per goals of care.  Joellen Jersey, RN

## 2019-06-07 NOTE — Progress Notes (Signed)
Bronchoscopy obtained fluid resembling patient's NG feed.  - Repeat CXR  - KUB for NG tube placement verification  Milus Banister, [place], PGY-2 06/07/2019 11:09 AM

## 2019-06-07 NOTE — Progress Notes (Addendum)
Follow up - Critical Care Medicine Note  Patient Details:    Xavier Doyle is an 59 y.o. male with hypertension, hyperlipidemia, asthma, depression who was admitted on 05/06/2019 for chest pain. Patient was found to have newly diagnosed systolic heart failure with EF of 25-30% with global hypokinesis. Left and right heart catheterization did not show any ischemic cardiac disease. He had a cardiac arrest during hospitalization (torsades, converted into PEA), obtained ROSC with Epi x3, Amiodarone, Bicarb x2, and Defibrillation.   Patient suffered anoxic brain injury, has a poor prognosis. Family has agreed to modified DNR status until they can fly in to be with patient, at which point he will be transitioned to comfort care.  Lines, Airways, Drains: Airway 8 mm (Active)  Secured at (cm) 25 cm 06/07/19 0404  Measured From Lips 06/07/19 0404  Secured Location Left 06/07/19 0404  Secured By [employer] 06/07/19 0404  Tube Holder Repositioned Yes 06/07/19 0404  Cuff Pressure (cm H2O) 27 cm H2O 06/06/19 1938  Site Condition Dry 06/05/19 1947     Rectal Tube/Pouch (Active)  Output (mL) 200 mL 06/06/19 1600     External Urinary Catheter (Active)  Collection Container Standard drainage bag 06/06/19 2000  Securement Method Leg strap;Securing device (Describe) 06/06/19 2000  Site Assessment Clean;Dry;Intact 06/06/19 2000  Intervention Equipment Changed 06/06/19 0900  Output (mL) 350 mL 06/07/19 0500    Anti-infectives:  Anti-infectives (From admission, onward)   Start     Dose/Rate Route Frequency Ordered Stop   05/30/19 0445  ceFEPIme (MAXIPIME) 1 g in sodium chloride 0.9 % 100 mL IVPB     1 g 200 mL/hr over 30 Minutes Intravenous Every 24 hours 05/29/19 1121 06/01/19 0450   05/29/19 0445  ceFEPIme (MAXIPIME) 2 g in sodium chloride 0.9 % 100 mL IVPB  Status:  Discontinued     2 g 200 mL/hr over 30 Minutes Intravenous Every 24 hours 05/28/19 1214 05/29/19 1121   05/27/19 2159   vancomycin variable dose per unstable renal function (pharmacist dosing)  Status:  Discontinued      Does not apply See admin instructions 05/27/19 2159 05/28/19 1258   05/27/19 1600  vancomycin (VANCOREADY) IVPB 1500 mg/300 mL  Status:  Discontinued     1,500 mg 150 mL/hr over 120 Minutes Intravenous Every 24 hours 05/26/19 1514 05/26/19 1732   05/27/19 1600  vancomycin (VANCOCIN) IVPB 1000 mg/200 mL premix  Status:  Discontinued     1,000 mg 200 mL/hr over 60 Minutes Intravenous Every 24 hours 05/26/19 1732 05/27/19 2159   05/27/19 0400  ceFEPIme (MAXIPIME) 2 g in sodium chloride 0.9 % 100 mL IVPB  Status:  Discontinued     2 g 200 mL/hr over 30 Minutes Intravenous Every 12 hours 05/26/19 1729 05/28/19 1214   05/26/19 1545  vancomycin (VANCOCIN) 2,500 mg in sodium chloride 0.9 % 500 mL IVPB     2,500 mg 250 mL/hr over 120 Minutes Intravenous  Once 05/26/19 1514 05/26/19 1854   05/26/19 1530  ceFEPIme (MAXIPIME) 2 g in sodium chloride 0.9 % 100 mL IVPB  Status:  Discontinued     2 g 200 mL/hr over 30 Minutes Intravenous Every 8 hours 05/26/19 1514 05/26/19 1729      Microbiology: Results for orders placed or performed during the hospital encounter of 05/08/2019  Respiratory Panel by RT PCR (Flu A&B, Covid) - Nasopharyngeal Swab     Status: None   Collection Time: 05/08/2019 12:32 PM   Specimen: Nasopharyngeal Swab  Result Value Ref Range Status   SARS Coronavirus 2 by RT PCR NEGATIVE NEGATIVE Final    Comment: (NOTE) SARS-CoV-2 target nucleic acids are NOT DETECTED. The SARS-CoV-2 RNA is generally detectable in upper respiratoy specimens during the acute phase of infection. The lowest concentration of SARS-CoV-2 viral copies this assay can detect is 131 copies/mL. A negative result does not preclude SARS-Cov-2 infection and should not be used as the sole basis for treatment or other patient management decisions. A negative result may occur with  improper specimen collection/handling,  submission of specimen other than nasopharyngeal swab, presence of viral mutation(s) within the areas targeted by this assay, and inadequate number of viral copies (<131 copies/mL). A negative result must be combined with clinical observations, patient history, and epidemiological information. The expected result is Negative. Fact Sheet for Patients:  PinkCheek.be Fact Sheet for Healthcare Providers:  GravelBags.it This test is not yet ap proved or cleared by the [place] FDA and  has been authorized for detection and/or diagnosis of SARS-CoV-2 by FDA under an Emergency Use Authorization (EUA). This EUA will remain  in effect (meaning this test can be used) for the duration of the COVID-19 declaration under Section 564(b)(1) of the Act, 21 U.S.C. section 360bbb-3(b)(1), unless the authorization is terminated or revoked sooner.    Influenza A by PCR NEGATIVE NEGATIVE Final   Influenza B by PCR NEGATIVE NEGATIVE Final    Comment: (NOTE) The Xpert Xpress SARS-CoV-2/FLU/RSV assay is intended as an aid in  the diagnosis of influenza from Nasopharyngeal swab specimens and  should not be used as a sole basis for treatment. Nasal washings and  aspirates are unacceptable for Xpert Xpress SARS-CoV-2/FLU/RSV  testing. Fact Sheet for Patients: PinkCheek.be Fact Sheet for Healthcare Providers: GravelBags.it This test is not yet approved or cleared by the [place] FDA and  has been authorized for detection and/or diagnosis of SARS-CoV-2 by  FDA under an Emergency Use Authorization (EUA). This EUA will remain  in effect (meaning this test can be used) for the duration of the  Covid-19 declaration under Section 564(b)(1) of the Act, 21  U.S.C. section 360bbb-3(b)(1), unless the authorization is  terminated or revoked. Performed at [facility], [place] [address],  [place], [place] [address]   Culture, blood (routine x 2)     Status: None   Collection Time: 05/26/19  3:20 PM   Specimen: BLOOD RIGHT ARM  Result Value Ref Range Status   Specimen Description BLOOD RIGHT ARM  Final   Special Requests   Final    BOTTLES DRAWN AEROBIC ONLY Blood Culture results may not be optimal due to an inadequate volume of blood received in culture bottles   Culture   Final    NO GROWTH 5 DAYS Performed at [facility], [place] [address], [place], [place] [address]    Report Status 05/31/2019 FINAL  Final  Culture, blood (routine x 2)     Status: None   Collection Time: 05/26/19  3:27 PM   Specimen: BLOOD RIGHT HAND  Result Value Ref Range Status   Specimen Description BLOOD RIGHT HAND  Final   Special Requests   Final    BOTTLES DRAWN AEROBIC ONLY Blood Culture results may not be optimal due to an inadequate volume of blood received in culture bottles   Culture   Final    NO GROWTH 5 DAYS Performed at [facility], [place] [address] [place], [place] [address]  Report Status 05/31/2019 FINAL  Final  Expectorated sputum assessment w rflx to resp cult     Status: None   Collection Time: 05/27/19 11:54 AM   Specimen: Sputum  Result Value Ref Range Status   Specimen Description SPUTUM  Final   Special Requests NONE  Final   Sputum evaluation   Final    THIS SPECIMEN IS ACCEPTABLE FOR SPUTUM CULTURE Performed at [facility], [place] [address], [place], [place] [address]    Report Status 05/27/2019 FINAL  Final  Culture, respiratory     Status: None   Collection Time: 05/27/19 11:54 AM   Specimen: SPU  Result Value Ref Range Status   Specimen Description SPUTUM  Final   Special Requests NONE Reflexed from [id number]  Final   Gram Stain   Final    MODERATE WBC PRESENT, PREDOMINANTLY PMN RARE GRAM POSITIVE COCCI    Culture   Final    RARE STAPHYLOCOCCUS AUREUS RARE GROUP B STREP(S.AGALACTIAE)ISOLATED TESTING AGAINST S. AGALACTIAE NOT ROUTINELY  PERFORMED DUE TO PREDICTABILITY OF AMP/PEN/VAN SUSCEPTIBILITY. Performed at [facility], [place] [address], [place], [place] [address]    Report Status 05/29/2019 FINAL  Final   Organism ID, Bacteria STAPHYLOCOCCUS AUREUS  Final      Susceptibility   Staphylococcus aureus - MIC*    CIPROFLOXACIN >=8 RESISTANT Resistant     ERYTHROMYCIN >=8 RESISTANT Resistant     GENTAMICIN <=0.5 SENSITIVE Sensitive     OXACILLIN 0.5 SENSITIVE Sensitive     TETRACYCLINE <=1 SENSITIVE Sensitive     VANCOMYCIN 1 SENSITIVE Sensitive     TRIMETH/SULFA <=10 SENSITIVE Sensitive     CLINDAMYCIN <=0.25 SENSITIVE Sensitive     RIFAMPIN <=0.5 SENSITIVE Sensitive     Inducible Clindamycin NEGATIVE Sensitive     * RARE STAPHYLOCOCCUS AUREUS    Best Practice/Protocols:  VTE Prophylaxis: Heparin (SQ)   Significant Events: 1/17 admitted to [facility] for chest pain 1/19 V. 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 Esophageal tube tip below the diaphragm but incompletely visualized. Left-sided central venous catheter tip partially obscured by support device, appears to be over the upper SVC. No pneumothorax.  Low lung volumes. Enlarged cardiomediastinal silhouette. Increasing airspace disease at the left base. Right lung grossly clear. IMPRESSION: 1. Endotracheal tube tip about 2.7 cm superior to carina. Esophageal tube tip below the diaphragm but incompletely visualized 2. Left IJ central venous catheter tip partially obscured, appears to overlie the SVC origin. No left pneumothorax 3. Increasing airspace disease at the left base.  Cardiomegaly. Electronically Signed   By: Donavan Foil M.D.   On: 05/15/2019 19:16   DG Chest 2 View  Result Date: 05/13/2019 CLINICAL DATA:  Chest pain,  shortness of breath. EXAM: CHEST - 2 VIEW COMPARISON:  None. FINDINGS: Mild cardiomegaly is noted. No pneumothorax or significant pleural effusion is noted. Lungs are clear. Bony thorax is unremarkable. IMPRESSION: No active cardiopulmonary disease. Electronically Signed   By: Marijo Conception M.D.   On: 06/01/2019 11:33   DG Abd 1 View  Result Date: 05/14/2019 CLINICAL DATA:  OG tube placement EXAM: ABDOMEN - 1 VIEW COMPARISON:  None. FINDINGS: Airspace disease at the left base. Esophageal tube tip overlies the distal stomach. IMPRESSION: Esophageal tube tip overlies the distal stomach. Electronically Signed   By: Donavan Foil M.D.   On: 05/11/2019 19:17   CARDIAC CATHETERIZATION  Result Date: 05/15/2019  Mid RCA lesion is 10% stenosed. No significant CAD.  LV end diastolic pressure is moderately elevated. LVEDP 26 mm Hg.  There is no aortic valve stenosis.  Hemodynamic findings consistent with mild pulmonary hypertension.  Ao sat 96%, PA sat 67%, mean PA pressure 29 mm Hg; mean PCWP 20; CO 6.3 L/min, CI 2.72  Medical therapy for nonischemic cardiomyopathy.   DG CHEST PORT 1 VIEW  Result Date: 06/07/2019 CLINICAL DATA:  Fevers. EXAM: PORTABLE CHEST 1 VIEW COMPARISON:  06/06/2019 FINDINGS: ET tube tip is above the carina. There is a feeding tube with tip below the GE junction. Interval complete opacification of the left  lung is identified compared with 06/06/2019. Findings are favored to represent complete atelectasis of the left lung which may reflect underlying mucous plugging. Increase interstitial markings within the right base also noted. IMPRESSION: Complete opacification of the left lung favored to represent complete atelectasis of the left lung which may reflect underlying mucous plugging. Follow-up imaging advised to ensure resolution. Increased interstitial markings noted within the right base. These results will be called to the ordering clinician or representative by the Radiologist Assistant, and communication documented in the PACS or zVision Dashboard. Electronically Signed   By: Kerby Moors M.D.   On: 06/07/2019 10:10   DG CHEST PORT 1 VIEW  Result Date: 06/06/2019 CLINICAL DATA:  Status post cardiac arrest. EXAM: PORTABLE CHEST 1 VIEW COMPARISON:  06/05/2019 FINDINGS: The endotracheal tube is 3.8 cm above the carina. The feeding tube is coursing down the esophagus and into the stomach. Improved lung aeration with resolving right lower lobe atelectasis. No pneumothorax. IMPRESSION: Stable support apparatus. Improved lung aeration with resolving right lower lobe atelectasis. Electronically Signed   By: Marijo Sanes M.D.   On: 06/06/2019 06:46   DG Chest Port 1 View  Result Date: 06/05/2019 CLINICAL DATA:  Ventilator support. EXAM: PORTABLE CHEST 1 VIEW COMPARISON:  05/31/2019 FINDINGS: Endotracheal tube tip is 5 cm above the carina. Soft feeding tube enters the abdomen. Left internal jugular central line tip at the innominate SVC junction. Improved aeration in the left lower lobe. Worsened atelectasis in the right lower and middle lobes. Upper lungs remain clear. IMPRESSION: Lines and tubes satisfactory. Improved aeration in the left lower lobe. Worsened volume loss in the right middle lobe and lower lobe. Electronically Signed   By: Nelson Chimes M.D.   On: 06/05/2019 06:43   DG Chest Port 1  View  Result Date: 05/31/2019 CLINICAL DATA:  Acute respiratory failure EXAM: PORTABLE CHEST 1 VIEW COMPARISON:  Radiograph 05/30/2019 FINDINGS: *Endotracheal tube position in the mid trachea, 4 cm from the carina. *Transesophageal feeding tube tip terminates beyond the GE junction, below the level of imaging. *Left IJ central venous catheter tip terminates at the brachiocephalic-caval confluence. *Telemetry leads overlie  the chest. There are patchy bilateral airspace opacities more focal confluent opacity is seen in the left lung base with obscuration of left hemidiaphragm which could reflect a combination of pleural fluid and parenchymal disease. No pneumothorax. Visualized cardiomediastinal contours are similar to prior accounting for differences in technique. No acute osseous or soft tissue abnormality. IMPRESSION: 1. Patchy bilateral airspace opacities are similar to prior. 2. More confluent opacity in the left lung base is slightly decreased from prior. Could reflect a combination of pleural fluid and parenchymal disease. 3. Lines and tubes as above Electronically Signed   By: Lovena Le M.D.   On: 05/31/2019 03:34   DG CHEST PORT 1 VIEW  Result Date: 05/30/2019 CLINICAL DATA:  Ventilator support.  Follow-up. EXAM: PORTABLE CHEST 1 VIEW COMPARISON:  05/28/2019 FINDINGS: Endotracheal tube tip is 4 cm above the carina. Soft feeding tube enters the abdomen. Left internal jugular central line tip is in the SVC at the azygos level. Right lung shows improvement with better aeration in the lower lobe. Worsened opacity in the left hemithorax with less aerated upper lobe. Findings could be consistent with consolidation and pleural fluid. IMPRESSION: Worsened appearance on the left with less aerated lung. Consolidation and possible pleural fluid. Improved aeration of the right lung base. Electronically Signed   By: Nelson Chimes M.D.   On: 05/30/2019 09:20   DG CHEST PORT 1 VIEW  Result Date:  05/28/2019 CLINICAL DATA:  Dyspnea EXAM: PORTABLE CHEST 1 VIEW COMPARISON:  05/27/2019 FINDINGS: Moderate interstitial/airspace opacities in the lungs bilaterally. Small to moderate left pleural effusion. No pneumothorax. Cardiomegaly. Endotracheal tube terminates 3.5 cm above the carina. Left IJ venous catheter terminates in the mid SVC. Enteric tube courses into the stomach. IMPRESSION: Moderate interstitial/airspace opacities in the lungs bilaterally, favoring interstitial edema over multifocal pneumonia, unchanged. Small to moderate left pleural effusion. Endotracheal tube terminates 3.5 cm above the carina. Additional support apparatus as above. Electronically Signed   By: Julian Hy M.D.   On: 05/28/2019 10:52   DG CHEST PORT 1 VIEW  Result Date: 05/27/2019 CLINICAL DATA:  Ventricular fibrillation, CHF.  Endotracheal tube. EXAM: PORTABLE CHEST 1 VIEW COMPARISON:  05/26/2019. FINDINGS: Endotracheal tube, NG tube, left IJ line in stable position. Cardiomegaly with progressive bilateral pulmonary infiltrates/edema. Small bilateral pleural effusions. Findings consistent with progressive CHF. Bilateral pneumonia cannot be excluded. No pneumothorax. IMPRESSION: 1.  Lines and tubes in stable position. 2. Cardiomegaly with progressive bilateral pulmonary infiltrates/edema. Small bilateral pleural effusions. Findings consistent progressive CHF. Bilateral pneumonia cannot be excluded. Electronically Signed   By: Marcello Moores  Register   On: 05/27/2019 05:50   DG CHEST PORT 1 VIEW  Result Date: 05/26/2019 CLINICAL DATA:  Dyspnea. Evaluate for possible aspiration. Intubated patient. EXAM: PORTABLE CHEST 1 VIEW COMPARISON:  05/17/2019 FINDINGS: There is opacity at both lung bases, likely a combination of atelectasis and pleural fluid. This has increased from the prior study. There is also bilateral perihilar opacity increased from the prior exam. These findings are accentuated by low lung volumes. Endotracheal  tube tip projects 2 point 8 cm above the carina. Left internal jugular central venous catheter has its tip at the confluence of the brachiocephalic vein and superior vena cava. Nasal/orogastric tube passes below the diaphragm well into the stomach. IMPRESSION: 1. Worsened lung aeration compared to the prior study. There are perihilar bilateral lung base opacities. Suspect combination of pulmonary edema with lung base atelectasis and pleural effusions. Pneumonia or aspiration pneumonitis is not excluded. 2. Stable support  apparatus. Electronically Signed   By: Lajean Manes M.D.   On: 05/26/2019 15:49   DG Chest Port 1 View  Result Date: 05/18/2019 CLINICAL DATA:  Status post intubation. EXAM: PORTABLE CHEST 1 VIEW COMPARISON:  May 22, 2019 FINDINGS: An endotracheal tube is seen with its distal tip approximately 4.7 cm from the carina. This represents a new finding when compared to the prior study. Mildly radiopaque tags and labels are seen overlying the left lung with subsequently limited evaluation of the pulmonary parenchyma within these regions. There is no evidence of acute infiltrate, pleural effusion or pneumothorax. There is mild to moderate severity enlargement of the cardiac silhouette. The visualized skeletal structures are unremarkable. IMPRESSION: 1. Interval endotracheal tube placement and positioning, as described above, when compared to the prior chest plain film dated May 22, 2019. Electronically Signed   By: Virgina Norfolk M.D.   On: 05/18/2019 17:43   DG Abd Portable 1V  Result Date: 05/27/2019 CLINICAL DATA:  Feeding tube placement EXAM: PORTABLE ABDOMEN - 1 VIEW COMPARISON:  Abdominal radiograph obtained earlier in the day FINDINGS: Nasogastric tube has been removed. Feeding tube now present with tip in proximal jejunum. There is paucity of bowel gas. No bowel dilatation or free air evident. There is consolidation in the left lower lobe as well as more patchy infiltrate in the  right base. IMPRESSION: 1.  Feeding tube tip in proximal jejunum. 2. Paucity of bowel gas. This finding raises concern for a degree of ileus or enteritis. Bowel obstruction less likely. No free air. 3. Consolidation throughout the left lower lobe with more patchy infiltrate left base. Small left pleural effusion cannot be excluded. Electronically Signed   By: Lowella Grip III M.D.   On: 05/27/2019 13:49   DG Abd Portable 1V  Result Date: 05/27/2019 CLINICAL DATA:  Impaired nasogastric feeding tube. EXAM: PORTABLE ABDOMEN - 1 VIEW COMPARISON:  One-view abdomen 06/04/2019. One-view chest x-ray 05/27/2019. FINDINGS: Side port of the NG tube is in the stomach. The stomach is decompressed. Left greater than right basilar airspace disease is noted. The heart is enlarged. Bowel gas pattern is unremarkable. IMPRESSION: 1. Side port of the NG tube is in the stomach. 2. Left greater than right basilar airspace disease. Electronically Signed   By: San Morelle M.D.   On: 05/27/2019 05:14   EEG adult  Result Date: 05/17/2019 Lora Havens, MD     05/21/2019  9:05 PM Patient Name: Melbern Cefalo MRN: [id number] Epilepsy Attending: Lora Havens Referring Physician/Provider: Merlene Laughter, NP Date: 06/01/2019 Duration: 21.03 mins Patient history: 59yo s/p cardiac arrest now on TTM. EEG to evaluate for seizure Level of alertness: comatose AEDs during EEG study: Propofol Technical aspects: This EEG study was done with scalp electrodes positioned according to the 10-20 International system of electrode placement. Electrical activity was acquired at a sampling rate of 500Hz  and reviewed with a high frequency filter of 70Hz  and a low frequency filter of 1Hz . EEG data were recorded continuously and digitally stored. DESCRIPTION: EEG showed continuous generalized background suppression. Intermittent generalized high amplitude sharply contoured 4-6zh theta slowing was also noted. Hyperventilation and photic  stimulation were not performed. ABNORMALITY - Background suppression, generalized - Intermittent slow, generalized IMPRESSION: This study is showed evidence of profound diffuse encephalopathy, non specific to etiology. No seizures or definite epileptiform discharges were seen throughout the recording. Priyanka [name]   Overnight EEG with video  Result Date: 05/25/2019 Lora Havens, MD  05/26/2019  9:09 AM Patient Name: Galileo Sieck MRN: [id number] Epilepsy Attending: Lora Havens Referring Physician/Provider: Merlene Laughter, NP Duration: 05/21/2019 2048 to 05/25/2019 2048  Patient history: 59yo s/p cardiac arrest now on TTM. EEG to evaluate for seizure  Level of alertness: comatose  AEDs during EEG study: Propofol, Keppra  Technical aspects: This EEG study was done with scalp electrodes positioned according to the 10-20 International system of electrode placement. Electrical activity was acquired at a sampling rate of 500Hz  and reviewed with a high frequency filter of 70Hz  and a low frequency filter of 1Hz . EEG data were recorded continuously and digitally stored.  DESCRIPTION: EEG showed continuous generalized background suppression. Intermittent generalized high amplitude sharply contoured 4-6zh theta slowing was also noted. Hyperventilation and photic stimulation were not performed. Event button was pressed multiple times (at 2340 and 2349 on 05/27/2019, at 1358, 1402, 1403, 1405, 1406, 1416, 1426, 1445, 1451, 1518, 1530, 1536, 1552, 1609, 1948 on 05/25/2019) for unclear reasons.  Concomitant EEG at times showed high amplitude sharply contoured 4 to 6 Hz theta slowing without clear evolution.  ABNORMALITY - Burst suppression, generalized  IMPRESSION: This study is showed evidence of profound diffuse encephalopathy, non specific to etiology. No seizures or definite epileptiform discharges were seen throughout the recording.  Priyanka [name]   EEG LTVM - Continuous Bedside W/ Video Includes  Portable EEG Read  Result Date: 05/30/2019 Lora Havens, MD     05/30/2019  1:52 PM Patient Name:Nicasio Nyoka Cowden [id number] Epilepsy Attending:Priyanka [name] Referring Physician/Provider:Dr. Dyann Ruddle Duration:05/29/2019 1529 to 05/30/2019 1036  Patient history:58yo s/p cardiac arrest now on TTM. EEG to evaluate for seizure  Level of alertness:comatose  AEDs during EEG study:Keppra, valproate, Versed, clonazepam  Technical aspects: This EEG study was done with scalp electrodes positioned according to the 10-20 International system of electrode placement. Electrical activity was acquired at a sampling rate of 500Hz  and reviewed with a high frequency filter of 70Hz  and a low frequency filter of 1Hz . EEG data were recorded continuously and digitally stored.  DESCRIPTION: EEG showed continuous generalized background suppression. EEG was reactive to tactile stimulation. Hyperventilation and photic stimulation were not performed.  EKG artifact was seen throughout the study. Event button was pressed multiple times during the study (On 05/29/2019 1701 and 2026, on 05/30/2019 at 739, 0800 and 0854). On video, patient does appear to have whole-body twitching or right or left side twitching. Concomitant EEG before during and after the event and does not show any EEG change to suggest seizure.  ABNORMALITY -Backgroundsuppression, generalized   IMPRESSION: This study isshowed evidence of profound diffuse encephalopathy, non specific to etiology. No seizures ordefiniteepileptiform discharges were seen throughout the recording. Event button was pressed multiple times as described above for whole body twitching and left or right leg twitching without concomitant EEG change and is therefore not epileptic. Of note, it appears that these episodes may be triggered by stimulation and given the semiology of the events could be stimulation induced myoclonus or subcortical myoclonus.  Lora Havens  ECHOCARDIOGRAM COMPLETE  Result Date: 05/25/2019   ECHOCARDIOGRAM REPORT   Patient Name:   JAESHAWN DEMSKI Date of Exam: 05/25/2019 Medical Rec #:  [id number]   Height:       68.0 in Accession #:    [id number]  Weight:       280.2 lb Date of Birth:  12-Nov-1960   BSA:          2.36 m Patient Age:  58 years    BP:           129/77 mmHg Patient Gender: M           HR:           43 bpm. Exam Location:  Inpatient Procedure: 2D Echo, Cardiac Doppler and Color Doppler STAT ECHO Indications:    Cardiac arrest I46.9  History:        Patient has prior history of Echocardiogram examinations, most                 recent 05/23/2019. Arrythmias:Cardiac Arrest,                 Signs/Symptoms:Chest Pain; Risk Factors:Hypertension,                 Dyslipidemia and Former Smoker.  Sonographer:    Paulita Fujita RDCS Referring Phys: [id number] CHI JANE ELLISON IMPRESSIONS  1. Left ventricular ejection fraction, by visual estimation, is 25 to 30%. The left ventricle has severely decreased function. There is mildly increased left ventricular hypertrophy.  2. Left ventricular diastolic parameters are consistent with Grade I diastolic dysfunction (impaired relaxation).  3. The left ventricle demonstrates global hypokinesis.  4. Global right ventricle has moderately reduced systolic function.The right ventricular size is normal. No increase in right ventricular wall thickness.  5. Left atrial size was normal.  6. Right atrial size was normal.  7. The mitral valve is normal in structure. Trivial mitral valve regurgitation. No evidence of mitral stenosis.  8. The tricuspid valve is normal in structure.  9. The tricuspid valve is normal in structure. Tricuspid valve regurgitation is trivial. 10. The aortic valve is tricuspid. Aortic valve regurgitation is trivial. No evidence of aortic valve sclerosis or stenosis. 11. The pulmonic valve was normal in structure. Pulmonic valve regurgitation is not visualized. 12. Moderately elevated  pulmonary artery systolic pressure. 13. The tricuspid regurgitant velocity is 2.73 m/s, and with an assumed right atrial pressure of 15 mmHg, the estimated right ventricular systolic pressure is moderately elevated at 44.8 mmHg. 14. The inferior vena cava is dilated in size with <50% respiratory variability, suggesting right atrial pressure of 15 mmHg. 15. No significant change from prior study. FINDINGS  Left Ventricle: Left ventricular ejection fraction, by visual estimation, is 25 to 30%. The left ventricle has severely decreased function. The left ventricle demonstrates global hypokinesis. There is mildly increased left ventricular hypertrophy. Left ventricular diastolic parameters are consistent with Grade I diastolic dysfunction (impaired relaxation). Normal left atrial pressure. Right Ventricle: The right ventricular size is normal. No increase in right ventricular wall thickness. Global RV systolic function is has moderately reduced systolic function. The tricuspid regurgitant velocity is 2.73 m/s, and with an assumed right atrial pressure of 15 mmHg, the estimated right ventricular systolic pressure is moderately elevated at 44.8 mmHg. Left Atrium: Left atrial size was normal in size. Right Atrium: Right atrial size was normal in size Pericardium: There is no evidence of pericardial effusion. Mitral Valve: The mitral valve is normal in structure. There is mild thickening of the mitral valve leaflet(s). Trivial mitral valve regurgitation. No evidence of mitral valve stenosis by observation. Tricuspid Valve: The tricuspid valve is normal in structure. Tricuspid valve regurgitation is trivial. Aortic Valve: The aortic valve is tricuspid. . There is mild thickening and mild calcification of the aortic valve. Aortic valve regurgitation is trivial. The aortic valve is structurally normal, with no evidence of sclerosis or stenosis. There is mild thickening of  the aortic valve. There is mild calcification of the  aortic valve. Pulmonic Valve: The pulmonic valve was normal in structure. Pulmonic valve regurgitation is not visualized. Pulmonic regurgitation is not visualized. Aorta: The aortic root, ascending aorta and aortic arch are all structurally normal, with no evidence of dilitation or obstruction. Venous: The inferior vena cava is dilated in size with less than 50% respiratory variability, suggesting right atrial pressure of 15 mmHg. IAS/Shunts: No atrial level shunt detected by color flow Doppler. There is no evidence of a patent foramen ovale. No ventricular septal defect is seen or detected. There is no evidence of an atrial septal defect.  LEFT VENTRICLE PLAX 2D LVIDd:         5.20 cm LVIDs:         4.60 cm LV PW:         1.20 cm LV IVS:        1.20 cm LVOT diam:     2.10 cm LV SV:         32 ml LV SV Index:   12.71 LVOT Area:     3.46 cm  LV Volumes (MOD) LV area d, A2C:    47.00 cm LV area d, A4C:    59.90 cm LV area s, A2C:    36.30 cm LV area s, A4C:    49.10 cm LV major d, A2C:   9.44 cm LV major d, A4C:   10.20 cm LV major s, A2C:   8.16 cm LV major s, A4C:   9.55 cm LV vol d, MOD A2C: 195.0 ml LV vol d, MOD A4C: 290.0 ml LV vol s, MOD A2C: 136.0 ml LV vol s, MOD A4C: 212.0 ml LV SV MOD A2C:     59.0 ml LV SV MOD A4C:     290.0 ml LV SV MOD BP:      63.5 ml RIGHT VENTRICLE TAPSE (M-mode): 1.3 cm LEFT ATRIUM             Index       RIGHT ATRIUM           Index LA diam:        3.60 cm 1.53 cm/m  RA Area:     21.00 cm LA Vol (A2C):   56.7 ml 24.04 ml/m RA Volume:   59.50 ml  25.23 ml/m LA Vol (A4C):   47.6 ml 20.18 ml/m LA Biplane Vol: 54.2 ml 22.98 ml/m  AORTIC VALVE LVOT Vmax:   117.00 cm/s LVOT Vmean:  69.800 cm/s LVOT VTI:    0.167 m  AORTA Ao Root diam: 3.20 cm TRICUSPID VALVE TR Peak grad:   29.8 mmHg TR Vmax:        273.00 cm/s  SHUNTS Systemic VTI:  0.17 m Systemic Diam: 2.10 cm  Candee Furbish MD Electronically signed by Candee Furbish MD Signature Date/Time: 05/25/2019/8:58:28 AM    Final     ECHOCARDIOGRAM COMPLETE  Result Date: 05/23/2019   ECHOCARDIOGRAM REPORT   Patient Name:   ADVAY PILCHER Date of Exam: 05/23/2019 Medical Rec #:  [id number]   Height:       68.0 in Accession #:    [id number]  Weight:       270.8 lb Date of Birth:  August 06, 1960   BSA:          2.32 m Patient Age:    29 years    BP:           153/94 mmHg Patient Gender:  M           HR:           77 bpm. Exam Location:  Inpatient Procedure: 2D Echo, Cardiac Doppler and Color Doppler Indications:    Chest pain  History:        Patient has no prior history of Echocardiogram examinations.                 Signs/Symptoms:Chest Pain; Risk Factors:Hypertension and                 Dyslipidemia. Alcohol abuse, Cocaine abuse.  Sonographer:    Dustin Flock Referring Phys: [id number] Darreld Mclean  Sonographer Comments: Patient is morbidly obese. IMPRESSIONS  1. Left ventricular ejection fraction, by visual estimation, is 25 to 30%. The left ventricle has severely decreased function. There is moderately increased left ventricular hypertrophy.  2. Left ventricular diastolic parameters are consistent with Grade I diastolic dysfunction (impaired relaxation).  3. The left ventricle demonstrates global hypokinesis.  4. Global right ventricle has mildly reduced systolic function.The right ventricular size is normal. No increase in right ventricular wall thickness.  5. Left atrial size was mildly dilated.  6. Right atrial size was mildly dilated.  7. The mitral valve is normal in structure. Mild mitral valve regurgitation. No evidence of mitral stenosis.  8. The tricuspid valve is normal in structure. Tricuspid valve regurgitation is not demonstrated.  9. The aortic valve is tricuspid. Aortic valve regurgitation is mild. Mild aortic valve sclerosis without stenosis. 10. The inferior vena cava is normal in size with greater than 50% respiratory variability, suggesting right atrial pressure of 3 mmHg. 11. TR signal is inadequate for assessing  pulmonary artery systolic pressure. FINDINGS  Left Ventricle: Left ventricular ejection fraction, by visual estimation, is 25 to 30%. The left ventricle has severely decreased function. The left ventricle demonstrates global hypokinesis. The left ventricular internal cavity size was the left ventricle is normal in size. There is moderately increased left ventricular hypertrophy. Left ventricular diastolic parameters are consistent with Grade I diastolic dysfunction (impaired relaxation). Right Ventricle: The right ventricular size is normal. No increase in right ventricular wall thickness. Global RV systolic function is has mildly reduced systolic function. Left Atrium: Left atrial size was mildly dilated. Right Atrium: Right atrial size was mildly dilated Pericardium: There is no evidence of pericardial effusion. Mitral Valve: The mitral valve is normal in structure. Mild mitral valve regurgitation. No evidence of mitral valve stenosis by observation. Tricuspid Valve: The tricuspid valve is normal in structure. Tricuspid valve regurgitation is not demonstrated. Aortic Valve: The aortic valve is tricuspid. Aortic valve regurgitation is mild. Mild aortic valve sclerosis is present, with no evidence of aortic valve stenosis. Pulmonic Valve: The pulmonic valve was normal in structure. Pulmonic valve regurgitation is not visualized. Pulmonic regurgitation is not visualized. Aorta: The aortic root is normal in size and structure. Venous: The inferior vena cava is normal in size with greater than 50% respiratory variability, suggesting right atrial pressure of 3 mmHg. IAS/Shunts: No atrial level shunt detected by color flow Doppler.  LEFT VENTRICLE PLAX 2D LVIDd:         5.52 cm  Diastology LVIDs:         4.56 cm  LV e' lateral:   5.87 cm/s LV PW:         1.43 cm  LV E/e' lateral: 14.5 LV IVS:        1.49 cm  LV e' medial:  5.55 cm/s LVOT diam:     2.10 cm  LV E/e' medial:  15.4 LV SV:         53 ml LV SV Index:   21.44  LVOT Area:     3.46 cm  RIGHT VENTRICLE RV Basal diam:  2.62 cm RV S prime:     9.68 cm/s TAPSE (M-mode): 3.4 cm LEFT ATRIUM           Index       RIGHT ATRIUM           Index LA diam:      3.80 cm 1.63 cm/m  RA Area:     18.90 cm LA Vol (A2C): 33.0 ml 14.20 ml/m RA Volume:   53.00 ml  22.80 ml/m LA Vol (A4C): 83.1 ml 35.75 ml/m  AORTIC VALVE LVOT Vmax:   83.30 cm/s LVOT Vmean:  55.900 cm/s LVOT VTI:    0.160 m  AORTA Ao Root diam: 3.10 cm MITRAL VALVE MV Area (PHT): 4.68 cm             SHUNTS MV PHT:        46.98 msec           Systemic VTI:  0.16 m MV Decel Time: 162 msec             Systemic Diam: 2.10 cm MV E velocity: 85.30 cm/s 103 cm/s MV A velocity: 67.30 cm/s 70.3 cm/s MV E/A ratio:  1.27       1.5  Loralie Champagne MD Electronically signed by Loralie Champagne MD Signature Date/Time: 05/23/2019/3:37:26 PM    Final    VAS [place] UPPER EXTREMITY VENOUS DUPLEX  Result Date: 06/01/2019 UPPER VENOUS STUDY  Indications: Swelling Limitations: Poor ultrasound/tissue interface, bandages and line. Performing Technologist: Antonieta Pert RDMS, RVT  Examination Guidelines: A complete evaluation includes B-mode imaging, spectral Doppler, color Doppler, and power Doppler as needed of all accessible portions of each vessel. Bilateral testing is considered an integral part of a complete examination. Limited examinations for reoccurring indications may be performed as noted.  Right Findings: +----------+------------+---------+-----------+----------+-------+ RIGHT     CompressiblePhasicitySpontaneousPropertiesSummary +----------+------------+---------+-----------+----------+-------+ Subclavian    Full       Yes       Yes                      +----------+------------+---------+-----------+----------+-------+  Left Findings: +----------+------------+---------+-----------+----------+-------------------+ LEFT      CompressiblePhasicitySpontaneousProperties      Summary        +----------+------------+---------+-----------+----------+-------------------+ IJV           Full       Yes       Yes                                  +----------+------------+---------+-----------+----------+-------------------+ Subclavian    Full       Yes       Yes                                  +----------+------------+---------+-----------+----------+-------------------+ Axillary      Full       Yes       Yes                                  +----------+------------+---------+-----------+----------+-------------------+  Brachial      Full                                                      +----------+------------+---------+-----------+----------+-------------------+ Radial                                                Not visualized    +----------+------------+---------+-----------+----------+-------------------+ Ulnar                                                 Not visualized    +----------+------------+---------+-----------+----------+-------------------+ Cephalic      Full                                                      +----------+------------+---------+-----------+----------+-------------------+ Basilic     Partial                                 not well visualized +----------+------------+---------+-----------+----------+-------------------+  Summary:  Right: No evidence of thrombosis in the subclavian.  Left: No evidence of deep vein thrombosis in the upper extremity. No evidence of superficial vein thrombosis in the upper extremity. Left forearm covered by bandages.  *See table(s) above for measurements and observations.  Diagnosing physician: Deitra Mayo MD Electronically signed by Deitra Mayo MD on 06/01/2019 at 7:50:35 AM.    Final     Consults: Treatment Team:  Lind Covert, MD   Subjective:    Overnight Issues: spiked fever up to 102.7*F. Tylenol given, cooling blanket and ice packes  applied.  Objective:  Vital signs for last 24 hours: Temp:  [100 F (37.8 C)-102 F (38.9 C)] 100.4 F (38 C) (02/02 0751) Pulse Rate:  [91-139] 114 (02/02 0818) Resp:  [16-38] 30 (02/02 0818) BP: (110-154)/(67-95) 154/84 (02/02 0818) SpO2:  [86 %-100 %] 95 % (02/02 0818) FiO2 (%):  [40 %-60 %] 60 % (02/02 0818)  Intake/Output from previous day: 02/01 0701 - 02/02 0700 In: 2409.4 [I.V.:44.4; TT:[id number]; IV Piggyback:100] Out: 2665 [Urine:2165; Stool:500]  Intake/Output this shift: No intake/output data recorded.  Vent settings for last 24 hours: Vent Mode: PRVC FiO2 (%):  [40 %-60 %] 60 % Set Rate:  [30 bmp] 30 bmp Vt Set:  [540 mL] 540 mL PEEP:  [5 cmH20] 5 cmH20 Plateau Pressure:  [20 cmH20-24 cmH20] 20 cmH20  CBC Latest Ref Rng & Units 06/07/2019 06/06/2019 06/05/2019  WBC 4.0 - 10.5 K/uL 31.7(H) 23.8(H) 21.5(H)  Hemoglobin 13.0 - 17.0 g/dL 10.4(L) 10.4(L) 11.5(L)  Hematocrit 39.0 - 52.0 % 34.6(L) 35.6(L) 38.3(L)  Platelets 150 - 400 K/uL 534(H) 421(H) 436(H)   CMP Latest Ref Rng & Units 06/06/2019 06/05/2019 06/05/2019  Glucose 70 - 99 mg/dL 121(H) 147(H) 173(H)  BUN 6 - 20 mg/dL 99(H) 107(H) 114(H)  Creatinine 0.61 - 1.24 mg/dL 2.06(H) 2.07(H) 2.22(H)  Sodium 135 - 145 mmol/L 153(H)  153(H) 153(H)  Potassium 3.5 - 5.1 mmol/L 3.5 3.3(L) 3.3(L)  Chloride 98 - 111 mmol/L 113(H) 115(H) 114(H)  CO2 22 - 32 mmol/L 24 24 24   Calcium 8.9 - 10.3 mg/dL 10.2 9.7 10.0  Total Protein 6.5 - 8.1 g/dL - - -  Total Bilirubin 0.3 - 1.2 mg/dL - - -  Alkaline Phos 38 - 126 U/L - - -  AST 15 - 41 U/L - - -  ALT 0 - 44 U/L - - -   Physical Exam:  General: ill appearing, nonresponsive Neuro: myoclonus HEENT/Neck: ETT and normocephalic, atraumatic Resp: rales bilaterally CVS: HR 110, regular rhythm, S1, S2 clear, no murmur Skin: no rash Extremities: no edema, no erythema, pulses WNL  Assessment/Plan:   Cardiogenic shock secondary to V. fib arrest on 05/26/2019 s/p TTM completion on  1/23 HFrEF (EF 25-30% G1DD) - Plan to continue amiodarone 400mg  BID. Hemodynamically improved - Remains net negative, holding off on any further diuresis for now - Atorvastatin 80mg  daily - could discontinue d/t recent change to plan of care  Fever ON, increasing WBC count: WBC jumped form 23.8 up to 31.7, spiked a fever ON 102.7*F.  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 Due to the extensive neurological injury the patient has a poor prognosis.Expecting hospital death. After speaking with patient's son/POA on 06/06/2019 it was decided to refocus intervention with goal being comfort care. Son is still planning to travel to [facility] from [place], would like to be present before transitioning patient to full comfort care measures. - Continue Versed infusion 10mg /hr, Fentanyl 100-350mcg/hr - Continue Valproic Acid 500mg  BID per tube and Keppra 1000mg  BID - Seizure Precautions in place  - Clonazepam 2mg  BID per tube - Oxycodone 5mg  q6 per tube - Quetiapine 25mg  daily  Acute hypoxemic respiratory failure, at this juncture principally due to encephalopathy [facility] acquired/Aspiration Pneumonia - Full course cefepime completed - Continue pulmonary hygiene - Follow intermittent chest x-ray  Acute renal failure due to cardiorenal syndrome: serum creatinine continues to improve. Continued stable urine output, produced 1.7L output 02/01.  - Monitor BMP, urine output - Defer Lasix as patient is auto-diuresing  Hypernatremia: 144 > 153 06/05/2019, has remain unchanged. Patient continues to produce urine. High risk for DI given his brain  injury. - Continue free water per tube - Follow up BMP 06/07/2019. - May need to start D5W to account for free water losses if this actually is DI  T2DM: A1c 6.4%. CBG 02/01 128-168. - SSI q4 hours PRN Hyperglycemia - Levemir 10 units daily   DVT Prophylaxis: Heparin 5,000 units q8    LOS: 14 days   Additional comments: None  Milus Banister, [place], PGY-2 06/07/2019 11:01 AM

## 2019-06-07 NOTE — Social Work (Signed)
CSW received a call from social work office case Psychologist, prison and probation services [place]. Pt son Markael Hyder requested a call back at [phone]. HIPAA compliant message left at that number.   CSW continuing to follow for support with disposition when medically appropriate.  Westley Hummer, MSW, [place] Work [phone]

## 2019-06-07 NOTE — Plan of Care (Signed)
  Problem: Clinical Measurements: Goal: Ability to maintain clinical measurements within normal limits will improve Outcome: Progressing Goal: Respiratory complications will improve Outcome: Not Progressing Goal: Cardiovascular complication will be avoided Outcome: Progressing   Problem: Activity: Goal: Risk for activity intolerance will decrease Outcome: Not Progressing   Problem: Nutrition: Goal: Adequate nutrition will be maintained Outcome: Not Progressing   Problem: Elimination: Goal: Will not experience complications related to bowel motility Outcome: Progressing Goal: Will not experience complications related to urinary retention Outcome: Progressing   Problem: Safety: Goal: Ability to remain free from injury will improve Outcome: Progressing   Problem: Skin Integrity: Goal: Risk for impaired skin integrity will decrease Outcome: Progressing

## 2019-06-07 NOTE — Progress Notes (Addendum)
Pharmacy Antibiotic Note  Xavier Doyle is a 59 y.o. male s/p VF arrest and acute encephalopathy. He was noted with fever and increasing WBC. He is also noted with AKI  Pharmacy has been consulted for vancomycin and cefepime dosing. -WBC= 31.7, tmax= 102, SCr= 2.07 (0.9 on 05/12/2019), CrCl ~ 50  Plan: -Cefepime 2gm IV q12h -Vancomycin 2500mg  IV x1 followed by 1000mg  IV q24h (estimated AUC= 450) -Will follow renal function, cultures and clinical progress    Height: 5\' 8"  (172.7 cm) Weight: 265 lb 14 oz (120.6 kg) IBW/kg (Calculated) : 68.4  Temp (24hrs), Avg:100.6 F (38.1 C), Min:100 F (37.8 C), Max:102 F (38.9 C)  Recent Labs  Lab 06/02/19 0336 06/02/19 1908 06/03/19 0258 06/04/19 0500 06/05/19 0522 06/05/19 1925 06/06/19 0332 06/07/19 0246  WBC   < >  --  21.0* 22.0* 21.5*  --  23.8* 31.7*  CREATININE  --  3.46* 3.22*  --  2.22* 2.07* 2.06*  --    < > = values in this interval not displayed.    Estimated Creatinine Clearance: 49.4 mL/min (A) (by C-G formula based on SCr of 2.06 mg/dL (H)).    No Known Allergies  Antimicrobials this admission: Vancomycin 1/21 >> 1/23; 2/2>> Cefepime 1/21 >> 1/27; 2/2>>  Dose adjustments this admission:    Microbiology results: 1/22 sputum- staph aureus  Thank you for allowing pharmacy to be a part of this patient's care.  Hildred Laser, PharmD Clinical Pharmacist **Pharmacist phone directory can now be found on [email or web address] ([email or web address]).  Listed under [place].

## 2019-06-07 NOTE — Progress Notes (Signed)
FPTS continues to socially follow this patient.  Appreciate care provided by CCM in the ICU.  We will be happy to resume care when he is transferred to the floor. Family was contacted by palliative yesterday.   Gerlene Fee, D.O.  PGY-1 Family Medicine  06/07/2019 9:34 AM

## 2019-06-08 ENCOUNTER — Inpatient Hospital Stay (HOSPITAL_COMMUNITY): Payer: Medicare Other

## 2019-06-08 LAB — CBC WITH DIFFERENTIAL/PLATELET
Abs Immature Granulocytes: 0.34 10*3/uL — ABNORMAL HIGH (ref 0.00–0.07)
Basophils Absolute: 0.1 10*3/uL (ref 0.0–0.1)
Basophils Relative: 0 %
Eosinophils Absolute: 0.3 10*3/uL (ref 0.0–0.5)
Eosinophils Relative: 1 %
HCT: 31.1 % — ABNORMAL LOW (ref 39.0–52.0)
Hemoglobin: 9.4 g/dL — ABNORMAL LOW (ref 13.0–17.0)
Immature Granulocytes: 1 %
Lymphocytes Relative: 6 %
Lymphs Abs: 1.6 10*3/uL (ref 0.7–4.0)
MCH: 23.3 pg — ABNORMAL LOW (ref 26.0–34.0)
MCHC: 30.2 g/dL (ref 30.0–36.0)
MCV: 77.2 fL — ABNORMAL LOW (ref 80.0–100.0)
Monocytes Absolute: 1.7 10*3/uL — ABNORMAL HIGH (ref 0.1–1.0)
Monocytes Relative: 6 %
Neutro Abs: 22.5 10*3/uL — ABNORMAL HIGH (ref 1.7–7.7)
Neutrophils Relative %: 86 %
Platelets: 565 10*3/uL — ABNORMAL HIGH (ref 150–400)
RBC: 4.03 MIL/uL — ABNORMAL LOW (ref 4.22–5.81)
RDW: 17.2 % — ABNORMAL HIGH (ref 11.5–15.5)
WBC: 26.6 10*3/uL — ABNORMAL HIGH (ref 4.0–10.5)
nRBC: 0 % (ref 0.0–0.2)

## 2019-06-08 LAB — GLUCOSE, CAPILLARY
Glucose-Capillary: 102 mg/dL — ABNORMAL HIGH (ref 70–99)
Glucose-Capillary: 102 mg/dL — ABNORMAL HIGH (ref 70–99)
Glucose-Capillary: 108 mg/dL — ABNORMAL HIGH (ref 70–99)
Glucose-Capillary: 108 mg/dL — ABNORMAL HIGH (ref 70–99)
Glucose-Capillary: 134 mg/dL — ABNORMAL HIGH (ref 70–99)
Glucose-Capillary: 156 mg/dL — ABNORMAL HIGH (ref 70–99)

## 2019-06-08 LAB — VALPROIC ACID LEVEL: Valproic Acid Lvl: 18 ug/mL — ABNORMAL LOW (ref 50.0–100.0)

## 2019-06-08 NOTE — Progress Notes (Signed)
Patient ID: Seng Polishchuk, male   DOB: Jul 27, 1960, 59 y.o.   MRN: [id number]  This NP visited patient at the bedside as a follow up  for palliative medicine needs and emotional support.  Patient remains intubated, unable to follow commands and continues to fail to thrive/ today is day 17 of hospital stay.    Was able to speak with Margot Ables in [place].  Again we discussed the current medical situation; seriousness of multiple co-morbidites and grim prognosis.  He will arrive tomorrow afternoon around 5:00pm and hopes to visit with his father at that time,  and then to meet with the doctors on rounds on Friday morning and make the shift to a comfort path liberating him from the ventilator and allowing for natural death.  Questions and concerns addressed              Emotional support offered  Discussed with Dr Lynetta Mare via secure chat    Total time spent on the unit was 20 minutes     PMT will continue to support holistically  Greater than 50% of the time was spent in counseling and coordination of care  Wadie Lessen NP  Palliative Medicine Team Team Phone # [phone] Pager [phone]

## 2019-06-08 NOTE — Progress Notes (Addendum)
Follow up - Critical Care Medicine Note  Patient Details:    Xavier Doyle is an 59 y.o. male with history of hypertension, hyperlipidemia, asthma, depression admitted 05/28/2019 for chest pain.  Patient was found to have newly diagnosed systolic heart failure with EF 25-30% with global hypokinesis.  Patient had cardiac arrest during hospitalization, suffered anoxic brain injury with poor neurological recovery.   Family has agreed to modify DNR status until they can fly in to be with patient, at which point he will most likely be transitioned to comfort care.  Lines, Airways, Drains: Airway 8 mm (Active)  Secured at (cm) 25 cm 06/08/19 0424  Measured From Lips 06/08/19 0424  Secured Location Left 06/08/19 0424  Secured By [employer] 06/08/19 0424  Tube Holder Repositioned Yes 06/08/19 0424  Cuff Pressure (cm H2O) 24 cm H2O 06/07/19 2029  Site Condition Dry 06/07/19 0900     Rectal Tube/Pouch (Active)  Output (mL) 600 mL 06/07/19 2000     External Urinary Catheter (Active)  Collection Container Dedicated Suction Canister 06/07/19 2000  Securement Method Tape 06/07/19 2000  Site Assessment Clean;Dry;Intact 06/07/19 2000  Intervention Equipment Changed 06/07/19 1615  Output (mL) 200 mL 06/08/19 0500    Anti-infectives:  Anti-infectives (From admission, onward)   Start     Dose/Rate Route Frequency Ordered Stop   06/08/19 1200  vancomycin (VANCOCIN) IVPB 1000 mg/200 mL premix     1,000 mg 200 mL/hr over 60 Minutes Intravenous Every 24 hours 06/07/19 1105     06/07/19 1200  ceFEPIme (MAXIPIME) 2 g in sodium chloride 0.9 % 100 mL IVPB     2 g 200 mL/hr over 30 Minutes Intravenous Every 12 hours 06/07/19 1105     06/07/19 1200  vancomycin (VANCOCIN) 2,500 mg in sodium chloride 0.9 % 500 mL IVPB     2,500 mg 250 mL/hr over 120 Minutes Intravenous  Once 06/07/19 1105 06/07/19 1537   05/30/19 0445  ceFEPIme (MAXIPIME) 1 g in sodium chloride 0.9 % 100 mL IVPB     1 g 200  mL/hr over 30 Minutes Intravenous Every 24 hours 05/29/19 1121 06/01/19 0450   05/29/19 0445  ceFEPIme (MAXIPIME) 2 g in sodium chloride 0.9 % 100 mL IVPB  Status:  Discontinued     2 g 200 mL/hr over 30 Minutes Intravenous Every 24 hours 05/28/19 1214 05/29/19 1121   05/27/19 2159  vancomycin variable dose per unstable renal function (pharmacist dosing)  Status:  Discontinued      Does not apply See admin instructions 05/27/19 2159 05/28/19 1258   05/27/19 1600  vancomycin (VANCOREADY) IVPB 1500 mg/300 mL  Status:  Discontinued     1,500 mg 150 mL/hr over 120 Minutes Intravenous Every 24 hours 05/26/19 1514 05/26/19 1732   05/27/19 1600  vancomycin (VANCOCIN) IVPB 1000 mg/200 mL premix  Status:  Discontinued     1,000 mg 200 mL/hr over 60 Minutes Intravenous Every 24 hours 05/26/19 1732 05/27/19 2159   05/27/19 0400  ceFEPIme (MAXIPIME) 2 g in sodium chloride 0.9 % 100 mL IVPB  Status:  Discontinued     2 g 200 mL/hr over 30 Minutes Intravenous Every 12 hours 05/26/19 1729 05/28/19 1214   05/26/19 1545  vancomycin (VANCOCIN) 2,500 mg in sodium chloride 0.9 % 500 mL IVPB     2,500 mg 250 mL/hr over 120 Minutes Intravenous  Once 05/26/19 1514 05/26/19 1854   05/26/19 1530  ceFEPIme (MAXIPIME) 2 g in sodium chloride 0.9 % 100  mL IVPB  Status:  Discontinued     2 g 200 mL/hr over 30 Minutes Intravenous Every 8 hours 05/26/19 1514 05/26/19 1729      Microbiology: Results for orders placed or performed during the hospital encounter of 05/11/2019  Respiratory Panel by RT PCR (Flu A&B, Covid) - Nasopharyngeal Swab     Status: None   Collection Time: 05/08/2019 12:32 PM   Specimen: Nasopharyngeal Swab  Result Value Ref Range Status   SARS Coronavirus 2 by RT PCR NEGATIVE NEGATIVE Final    Comment: (NOTE) SARS-CoV-2 target nucleic acids are NOT DETECTED. The SARS-CoV-2 RNA is generally detectable in upper respiratoy specimens during the acute phase of infection. The lowest concentration of  SARS-CoV-2 viral copies this assay can detect is 131 copies/mL. A negative result does not preclude SARS-Cov-2 infection and should not be used as the sole basis for treatment or other patient management decisions. A negative result may occur with  improper specimen collection/handling, submission of specimen other than nasopharyngeal swab, presence of viral mutation(s) within the areas targeted by this assay, and inadequate number of viral copies (<131 copies/mL). A negative result must be combined with clinical observations, patient history, and epidemiological information. The expected result is Negative. Fact Sheet for Patients:  PinkCheek.be Fact Sheet for Healthcare Providers:  GravelBags.it This test is not yet ap proved or cleared by the [place] FDA and  has been authorized for detection and/or diagnosis of SARS-CoV-2 by FDA under an Emergency Use Authorization (EUA). This EUA will remain  in effect (meaning this test can be used) for the duration of the COVID-19 declaration under Section 564(b)(1) of the Act, 21 U.S.C. section 360bbb-3(b)(1), unless the authorization is terminated or revoked sooner.    Influenza A by PCR NEGATIVE NEGATIVE Final   Influenza B by PCR NEGATIVE NEGATIVE Final    Comment: (NOTE) The Xpert Xpress SARS-CoV-2/FLU/RSV assay is intended as an aid in  the diagnosis of influenza from Nasopharyngeal swab specimens and  should not be used as a sole basis for treatment. Nasal washings and  aspirates are unacceptable for Xpert Xpress SARS-CoV-2/FLU/RSV  testing. Fact Sheet for Patients: PinkCheek.be Fact Sheet for Healthcare Providers: GravelBags.it This test is not yet approved or cleared by the [place] FDA and  has been authorized for detection and/or diagnosis of SARS-CoV-2 by  FDA under an Emergency Use Authorization (EUA).  This EUA will remain  in effect (meaning this test can be used) for the duration of the  Covid-19 declaration under Section 564(b)(1) of the Act, 21  U.S.C. section 360bbb-3(b)(1), unless the authorization is  terminated or revoked. Performed at [facility], [place] [address], [place], [place] [address]   Culture, blood (routine x 2)     Status: None   Collection Time: 05/26/19  3:20 PM   Specimen: BLOOD RIGHT ARM  Result Value Ref Range Status   Specimen Description BLOOD RIGHT ARM  Final   Special Requests   Final    BOTTLES DRAWN AEROBIC ONLY Blood Culture results may not be optimal due to an inadequate volume of blood received in culture bottles   Culture   Final    NO GROWTH 5 DAYS Performed at [facility], [place] [address] [place], [place] [address]    Report Status 05/31/2019 FINAL  Final  Culture, blood (routine x 2)     Status: None   Collection Time: 05/26/19  3:27 PM   Specimen: BLOOD RIGHT HAND  Result  Value Ref Range Status   Specimen Description BLOOD RIGHT HAND  Final   Special Requests   Final    BOTTLES DRAWN AEROBIC ONLY Blood Culture results may not be optimal due to an inadequate volume of blood received in culture bottles   Culture   Final    NO GROWTH 5 DAYS Performed at [facility], [place] [address] [place], [place] [address]    Report Status 05/31/2019 FINAL  Final  Expectorated sputum assessment w rflx to resp cult     Status: None   Collection Time: 05/27/19 11:54 AM   Specimen: Sputum  Result Value Ref Range Status   Specimen Description SPUTUM  Final   Special Requests NONE  Final   Sputum evaluation   Final    THIS SPECIMEN IS ACCEPTABLE FOR SPUTUM CULTURE Performed at [facility], [place] [address] [place], [place] [address]    Report Status 05/27/2019 FINAL  Final  Culture, respiratory     Status: None   Collection Time: 05/27/19 11:54 AM   Specimen: SPU  Result Value Ref Range Status   Specimen Description SPUTUM   Final   Special Requests NONE Reflexed from [id number]  Final   Gram Stain   Final    MODERATE WBC PRESENT, PREDOMINANTLY PMN RARE GRAM POSITIVE COCCI    Culture   Final    RARE STAPHYLOCOCCUS AUREUS RARE GROUP B STREP(S.AGALACTIAE)ISOLATED TESTING AGAINST S. AGALACTIAE NOT ROUTINELY PERFORMED DUE TO PREDICTABILITY OF AMP/PEN/VAN SUSCEPTIBILITY. Performed at [facility], [place] [address] [place], [place] [address]    Report Status 05/29/2019 FINAL  Final   Organism ID, Bacteria STAPHYLOCOCCUS AUREUS  Final      Susceptibility   Staphylococcus aureus - MIC*    CIPROFLOXACIN >=8 RESISTANT Resistant     ERYTHROMYCIN >=8 RESISTANT Resistant     GENTAMICIN <=0.5 SENSITIVE Sensitive     OXACILLIN 0.5 SENSITIVE Sensitive     TETRACYCLINE <=1 SENSITIVE Sensitive     VANCOMYCIN 1 SENSITIVE Sensitive     TRIMETH/SULFA <=10 SENSITIVE Sensitive     CLINDAMYCIN <=0.25 SENSITIVE Sensitive     RIFAMPIN <=0.5 SENSITIVE Sensitive     Inducible Clindamycin NEGATIVE Sensitive     * RARE STAPHYLOCOCCUS AUREUS    Best Practice/Protocols:  VTE Prophylaxis: Heparin (SQ)   Events: 1/17 admitted to [facility] for chest pain 1/19 V. 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 Esophageal tube tip below the diaphragm but incompletely visualized. Left-sided central venous catheter tip partially obscured by support device, appears to be over the upper SVC. No pneumothorax. Low lung volumes. Enlarged cardiomediastinal silhouette. Increasing airspace disease at the left base. Right lung grossly clear. IMPRESSION: 1. Endotracheal tube tip  about 2.7 cm superior to carina. Esophageal tube tip below the diaphragm but incompletely visualized 2. Left IJ central venous catheter tip partially obscured, appears to overlie the SVC origin. No left pneumothorax 3. Increasing airspace disease at the left base.  Cardiomegaly. Electronically Signed   By: Donavan Foil M.D.   On: 06/02/2019 19:16   DG Chest 2 View  Result Date: 05/23/2019 CLINICAL DATA:  Chest pain, shortness of breath. EXAM: CHEST - 2 VIEW COMPARISON:  None. FINDINGS: Mild cardiomegaly is noted. No pneumothorax or significant pleural effusion is noted. Lungs are clear. Bony thorax is unremarkable. IMPRESSION: No active cardiopulmonary disease. Electronically Signed   By: Marijo Conception M.D.   On: 05/15/2019 11:33   DG Abd 1 View  Result Date: 05/20/2019 CLINICAL DATA:  OG tube placement EXAM: ABDOMEN - 1 VIEW COMPARISON:  None. FINDINGS: Airspace disease at the left base. Esophageal tube tip overlies the distal stomach. IMPRESSION: Esophageal tube tip overlies the distal stomach. Electronically Signed   By: Donavan Foil M.D.   On: 05/07/2019 19:17   CARDIAC CATHETERIZATION  Result Date: 05/14/2019  Mid RCA lesion is 10% stenosed. No significant CAD.  LV end diastolic pressure is moderately elevated. LVEDP 26 mm Hg.  There is no aortic valve stenosis.  Hemodynamic findings consistent with mild pulmonary hypertension.  Ao sat 96%, PA sat 67%, mean PA pressure 29 mm Hg; mean PCWP 20; CO 6.3 L/min, CI 2.72  Medical therapy for nonischemic  cardiomyopathy.   DG CHEST PORT 1 VIEW  Result Date: 06/07/2019 CLINICAL DATA:  LEFT lung consolidation EXAM: PORTABLE CHEST 1 VIEW COMPARISON:  Radiograph 06/07/2019 at 957 hours FINDINGS: Endotracheal tube and feeding tube unchanged. Near complete opacification of the LEFT hemithorax. There is some improved aeration in LEFT upper . RIGHT lung relatively clear with mild interstitial edema pattern. IMPRESSION: 1. Some mild improvement in aeration to the LEFT upper lobe. The majority of the LEFT hemithorax remains opacified. 2. Stable support apparatus. 3. Mild interstitial edema pattern in the well aerated RIGHT lung. Electronically Signed   By: Suzy Bouchard M.D.   On: 06/07/2019 16:03   DG CHEST PORT 1 VIEW  Result Date: 06/07/2019 CLINICAL DATA:  Fevers. EXAM: PORTABLE CHEST 1 VIEW COMPARISON:  06/06/2019 FINDINGS: ET tube tip is above the carina. There is a feeding tube with tip below the GE junction. Interval complete opacification of the left lung is identified compared with 06/06/2019. Findings are favored to represent complete atelectasis of the left lung which may reflect underlying mucous plugging. Increase interstitial markings within the right base also noted. IMPRESSION: Complete opacification of the left lung favored to represent complete atelectasis of the left lung which may reflect underlying mucous plugging. Follow-up imaging advised to ensure resolution. Increased interstitial markings noted within the right base. These results will be called to the ordering clinician or representative by the Radiologist Assistant, and communication documented in the PACS or zVision Dashboard. Electronically Signed   By: Kerby Moors M.D.   On: 06/07/2019 10:10   DG CHEST PORT 1 VIEW  Result Date: 06/06/2019 CLINICAL DATA:  Status post cardiac arrest. EXAM: PORTABLE CHEST 1 VIEW COMPARISON:  06/05/2019 FINDINGS: The endotracheal tube is 3.8 cm above the carina. The feeding tube is coursing down the  esophagus and into the stomach. Improved lung aeration with resolving right lower lobe atelectasis. No pneumothorax. IMPRESSION: Stable support apparatus. Improved lung aeration with resolving right lower lobe atelectasis. Electronically Signed   By: Mamie Nick.  Gallerani M.D.   On: 06/06/2019 06:46   DG Chest Port 1 View  Result Date: 06/05/2019 CLINICAL DATA:  Ventilator support. EXAM: PORTABLE CHEST 1 VIEW COMPARISON:  05/31/2019 FINDINGS: Endotracheal tube tip is 5 cm above the carina. Soft feeding tube enters the abdomen. Left internal jugular central line tip at the innominate SVC junction. Improved aeration in the left lower lobe. Worsened atelectasis in the right lower and middle lobes. Upper lungs remain clear. IMPRESSION: Lines and tubes satisfactory. Improved aeration in the left lower lobe. Worsened volume loss in the right middle lobe and lower lobe. Electronically Signed   By: Nelson Chimes M.D.   On: 06/05/2019 06:43   DG Chest Port 1 View  Result Date: 05/31/2019 CLINICAL DATA:  Acute respiratory failure EXAM: PORTABLE CHEST 1 VIEW COMPARISON:  Radiograph 05/30/2019 FINDINGS: *Endotracheal tube position in the mid trachea, 4 cm from the carina. *Transesophageal feeding tube tip terminates beyond the GE junction, below the level of imaging. *Left IJ central venous catheter tip terminates at the brachiocephalic-caval confluence. *Telemetry leads overlie the chest. There are patchy bilateral airspace opacities more focal confluent opacity is seen in the left lung base with obscuration of left hemidiaphragm which could reflect a combination of pleural fluid and parenchymal disease. No pneumothorax. Visualized cardiomediastinal contours are similar to prior accounting for differences in technique. No acute osseous or soft tissue abnormality. IMPRESSION: 1. Patchy bilateral airspace opacities are similar to prior. 2. More confluent opacity in the left lung base is slightly decreased from prior. Could  reflect a combination of pleural fluid and parenchymal disease. 3. Lines and tubes as above Electronically Signed   By: Lovena Le M.D.   On: 05/31/2019 03:34   DG CHEST PORT 1 VIEW  Result Date: 05/30/2019 CLINICAL DATA:  Ventilator support.  Follow-up. EXAM: PORTABLE CHEST 1 VIEW COMPARISON:  05/28/2019 FINDINGS: Endotracheal tube tip is 4 cm above the carina. Soft feeding tube enters the abdomen. Left internal jugular central line tip is in the SVC at the azygos level. Right lung shows improvement with better aeration in the lower lobe. Worsened opacity in the left hemithorax with less aerated upper lobe. Findings could be consistent with consolidation and pleural fluid. IMPRESSION: Worsened appearance on the left with less aerated lung. Consolidation and possible pleural fluid. Improved aeration of the right lung base. Electronically Signed   By: Nelson Chimes M.D.   On: 05/30/2019 09:20   DG CHEST PORT 1 VIEW  Result Date: 05/28/2019 CLINICAL DATA:  Dyspnea EXAM: PORTABLE CHEST 1 VIEW COMPARISON:  05/27/2019 FINDINGS: Moderate interstitial/airspace opacities in the lungs bilaterally. Small to moderate left pleural effusion. No pneumothorax. Cardiomegaly. Endotracheal tube terminates 3.5 cm above the carina. Left IJ venous catheter terminates in the mid SVC. Enteric tube courses into the stomach. IMPRESSION: Moderate interstitial/airspace opacities in the lungs bilaterally, favoring interstitial edema over multifocal pneumonia, unchanged. Small to moderate left pleural effusion. Endotracheal tube terminates 3.5 cm above the carina. Additional support apparatus as above. Electronically Signed   By: Julian Hy M.D.   On: 05/28/2019 10:52   DG CHEST PORT 1 VIEW  Result Date: 05/27/2019 CLINICAL DATA:  Ventricular fibrillation, CHF.  Endotracheal tube. EXAM: PORTABLE CHEST 1 VIEW COMPARISON:  05/26/2019. FINDINGS: Endotracheal tube, NG tube, left IJ line in stable position. Cardiomegaly with  progressive bilateral pulmonary infiltrates/edema. Small bilateral pleural effusions. Findings consistent with progressive CHF. Bilateral pneumonia cannot be excluded. No pneumothorax. IMPRESSION: 1.  Lines and tubes in stable position. 2. Cardiomegaly  with progressive bilateral pulmonary infiltrates/edema. Small bilateral pleural effusions. Findings consistent progressive CHF. Bilateral pneumonia cannot be excluded. Electronically Signed   By: Marcello Moores  Register   On: 05/27/2019 05:50   DG CHEST PORT 1 VIEW  Result Date: 05/26/2019 CLINICAL DATA:  Dyspnea. Evaluate for possible aspiration. Intubated patient. EXAM: PORTABLE CHEST 1 VIEW COMPARISON:  05/27/2019 FINDINGS: There is opacity at both lung bases, likely a combination of atelectasis and pleural fluid. This has increased from the prior study. There is also bilateral perihilar opacity increased from the prior exam. These findings are accentuated by low lung volumes. Endotracheal tube tip projects 2 point 8 cm above the carina. Left internal jugular central venous catheter has its tip at the confluence of the brachiocephalic vein and superior vena cava. Nasal/orogastric tube passes below the diaphragm well into the stomach. IMPRESSION: 1. Worsened lung aeration compared to the prior study. There are perihilar bilateral lung base opacities. Suspect combination of pulmonary edema with lung base atelectasis and pleural effusions. Pneumonia or aspiration pneumonitis is not excluded. 2. Stable support apparatus. Electronically Signed   By: Lajean Manes M.D.   On: 05/26/2019 15:49   DG Chest Port 1 View  Result Date: 05/21/2019 CLINICAL DATA:  Status post intubation. EXAM: PORTABLE CHEST 1 VIEW COMPARISON:  May 22, 2019 FINDINGS: An endotracheal tube is seen with its distal tip approximately 4.7 cm from the carina. This represents a new finding when compared to the prior study. Mildly radiopaque tags and labels are seen overlying the left lung with  subsequently limited evaluation of the pulmonary parenchyma within these regions. There is no evidence of acute infiltrate, pleural effusion or pneumothorax. There is mild to moderate severity enlargement of the cardiac silhouette. The visualized skeletal structures are unremarkable. IMPRESSION: 1. Interval endotracheal tube placement and positioning, as described above, when compared to the prior chest plain film dated May 22, 2019. Electronically Signed   By: Virgina Norfolk M.D.   On: 06/02/2019 17:43   DG Abd Portable 1V  Result Date: 06/07/2019 CLINICAL DATA:  OG tube placement. EXAM: PORTABLE ABDOMEN - 1 VIEW COMPARISON:  05/27/2019 FINDINGS: Feeding tube tip appears to be within the proximal jejunum beyond the ligament of Treitz in the same position as on the prior study. The visualized bowel gas pattern is normal. Dense consolidation in the left mid and lower lung zones. IMPRESSION: Feeding tube tip appears to be in the proximal jejunum. Electronically Signed   By: Lorriane Shire M.D.   On: 06/07/2019 12:02   DG Abd Portable 1V  Result Date: 05/27/2019 CLINICAL DATA:  Feeding tube placement EXAM: PORTABLE ABDOMEN - 1 VIEW COMPARISON:  Abdominal radiograph obtained earlier in the day FINDINGS: Nasogastric tube has been removed. Feeding tube now present with tip in proximal jejunum. There is paucity of bowel gas. No bowel dilatation or free air evident. There is consolidation in the left lower lobe as well as more patchy infiltrate in the right base. IMPRESSION: 1.  Feeding tube tip in proximal jejunum. 2. Paucity of bowel gas. This finding raises concern for a degree of ileus or enteritis. Bowel obstruction less likely. No free air. 3. Consolidation throughout the left lower lobe with more patchy infiltrate left base. Small left pleural effusion cannot be excluded. Electronically Signed   By: Lowella Grip III M.D.   On: 05/27/2019 13:49   DG Abd Portable 1V  Result Date:  05/27/2019 CLINICAL DATA:  Impaired nasogastric feeding tube. EXAM: PORTABLE ABDOMEN - 1 VIEW COMPARISON:  One-view abdomen 05/23/2019. One-view chest x-ray 05/27/2019. FINDINGS: Side port of the NG tube is in the stomach. The stomach is decompressed. Left greater than right basilar airspace disease is noted. The heart is enlarged. Bowel gas pattern is unremarkable. IMPRESSION: 1. Side port of the NG tube is in the stomach. 2. Left greater than right basilar airspace disease. Electronically Signed   By: San Morelle M.D.   On: 05/27/2019 05:14   EEG adult  Result Date: 05/25/2019 Lora Havens, MD     05/16/2019  9:05 PM Patient Name: Xavier Doyle MRN: [id number] Epilepsy Attending: Lora Havens Referring Physician/Provider: Merlene Laughter, NP Date: 05/12/2019 Duration: 21.03 mins Patient history: 59yo s/p cardiac arrest now on TTM. EEG to evaluate for seizure Level of alertness: comatose AEDs during EEG study: Propofol Technical aspects: This EEG study was done with scalp electrodes positioned according to the 10-20 International system of electrode placement. Electrical activity was acquired at a sampling rate of 500Hz  and reviewed with a high frequency filter of 70Hz  and a low frequency filter of 1Hz . EEG data were recorded continuously and digitally stored. DESCRIPTION: EEG showed continuous generalized background suppression. Intermittent generalized high amplitude sharply contoured 4-6zh theta slowing was also noted. Hyperventilation and photic stimulation were not performed. ABNORMALITY - Background suppression, generalized - Intermittent slow, generalized IMPRESSION: This study is showed evidence of profound diffuse encephalopathy, non specific to etiology. No seizures or definite epileptiform discharges were seen throughout the recording. Priyanka [name]   Overnight EEG with video  Result Date: 05/25/2019 Lora Havens, MD     05/26/2019  9:09 AM Patient Name: Xavier Doyle MRN:  [id number] Epilepsy Attending: Lora Havens Referring Physician/Provider: Merlene Laughter, NP Duration: 05/09/2019 2048 to 05/25/2019 2048  Patient history: 59yo s/p cardiac arrest now on TTM. EEG to evaluate for seizure  Level of alertness: comatose  AEDs during EEG study: Propofol, Keppra  Technical aspects: This EEG study was done with scalp electrodes positioned according to the 10-20 International system of electrode placement. Electrical activity was acquired at a sampling rate of 500Hz  and reviewed with a high frequency filter of 70Hz  and a low frequency filter of 1Hz . EEG data were recorded continuously and digitally stored.  DESCRIPTION: EEG showed continuous generalized background suppression. Intermittent generalized high amplitude sharply contoured 4-6zh theta slowing was also noted. Hyperventilation and photic stimulation were not performed. Event button was pressed multiple times (at 2340 and 2349 on 05/23/2019, at 1358, 1402, 1403, 1405, 1406, 1416, 1426, 1445, 1451, 1518, 1530, 1536, 1552, 1609, 1948 on 05/25/2019) for unclear reasons.  Concomitant EEG at times showed high amplitude sharply contoured 4 to 6 Hz theta slowing without clear evolution.  ABNORMALITY - Burst suppression, generalized  IMPRESSION: This study is showed evidence of profound diffuse encephalopathy, non specific to etiology. No seizures or definite epileptiform discharges were seen throughout the recording.  Priyanka [name]   EEG LTVM - Continuous Bedside W/ Video Includes Portable EEG Read  Result Date: 05/30/2019 Lora Havens, MD     05/30/2019  1:52 PM Patient Name:Xavier Doyle [id number] Epilepsy Attending:Priyanka [name] Referring Physician/Provider:Dr. Dyann Ruddle Duration:05/29/2019 1529 to 05/30/2019 1036  Patient history:58yo s/p cardiac arrest now on TTM. EEG to evaluate for seizure  Level of alertness:comatose  AEDs during EEG study:Keppra, valproate, Versed, clonazepam  Technical  aspects: This EEG study was done with scalp electrodes positioned according to the 10-20 International system of electrode placement. Electrical activity was acquired at a sampling rate of  500Hz  and reviewed with a high frequency filter of 70Hz  and a low frequency filter of 1Hz . EEG data were recorded continuously and digitally stored.  DESCRIPTION: EEG showed continuous generalized background suppression. EEG was reactive to tactile stimulation. Hyperventilation and photic stimulation were not performed.  EKG artifact was seen throughout the study. Event button was pressed multiple times during the study (On 05/29/2019 1701 and 2026, on 05/30/2019 at 739, 0800 and 0854). On video, patient does appear to have whole-body twitching or right or left side twitching. Concomitant EEG before during and after the event and does not show any EEG change to suggest seizure.  ABNORMALITY -Backgroundsuppression, generalized   IMPRESSION: This study isshowed evidence of profound diffuse encephalopathy, non specific to etiology. No seizures ordefiniteepileptiform discharges were seen throughout the recording. Event button was pressed multiple times as described above for whole body twitching and left or right leg twitching without concomitant EEG change and is therefore not epileptic. Of note, it appears that these episodes may be triggered by stimulation and given the semiology of the events could be stimulation induced myoclonus or subcortical myoclonus.  Lora Havens  ECHOCARDIOGRAM COMPLETE  Result Date: 05/25/2019   ECHOCARDIOGRAM REPORT   Patient Name:   Xavier Doyle Date of Exam: 05/25/2019 Medical Rec #:  [id number]   Height:       68.0 in Accession #:    [id number]  Weight:       280.2 lb Date of Birth:  November 19, 1960   BSA:          2.36 m Patient Age:    27 years    BP:           129/77 mmHg Patient Gender: M           HR:           43 bpm. Exam Location:  Inpatient Procedure: 2D Echo, Cardiac Doppler and  Color Doppler STAT ECHO Indications:    Cardiac arrest I46.9  History:        Patient has prior history of Echocardiogram examinations, most                 recent 05/23/2019. Arrythmias:Cardiac Arrest,                 Signs/Symptoms:Chest Pain; Risk Factors:Hypertension,                 Dyslipidemia and Former Smoker.  Sonographer:    Paulita Fujita RDCS Referring Phys: [id number] CHI JANE ELLISON IMPRESSIONS  1. Left ventricular ejection fraction, by visual estimation, is 25 to 30%. The left ventricle has severely decreased function. There is mildly increased left ventricular hypertrophy.  2. Left ventricular diastolic parameters are consistent with Grade I diastolic dysfunction (impaired relaxation).  3. The left ventricle demonstrates global hypokinesis.  4. Global right ventricle has moderately reduced systolic function.The right ventricular size is normal. No increase in right ventricular wall thickness.  5. Left atrial size was normal.  6. Right atrial size was normal.  7. The mitral valve is normal in structure. Trivial mitral valve regurgitation. No evidence of mitral stenosis.  8. The tricuspid valve is normal in structure.  9. The tricuspid valve is normal in structure. Tricuspid valve regurgitation is trivial. 10. The aortic valve is tricuspid. Aortic valve regurgitation is trivial. No evidence of aortic valve sclerosis or stenosis. 11. The pulmonic valve was normal in structure. Pulmonic valve regurgitation is not visualized. 12. Moderately elevated pulmonary artery systolic pressure. 13.  The tricuspid regurgitant velocity is 2.73 m/s, and with an assumed right atrial pressure of 15 mmHg, the estimated right ventricular systolic pressure is moderately elevated at 44.8 mmHg. 14. The inferior vena cava is dilated in size with <50% respiratory variability, suggesting right atrial pressure of 15 mmHg. 15. No significant change from prior study. FINDINGS  Left Ventricle: Left ventricular ejection fraction, by  visual estimation, is 25 to 30%. The left ventricle has severely decreased function. The left ventricle demonstrates global hypokinesis. There is mildly increased left ventricular hypertrophy. Left ventricular diastolic parameters are consistent with Grade I diastolic dysfunction (impaired relaxation). Normal left atrial pressure. Right Ventricle: The right ventricular size is normal. No increase in right ventricular wall thickness. Global RV systolic function is has moderately reduced systolic function. The tricuspid regurgitant velocity is 2.73 m/s, and with an assumed right atrial pressure of 15 mmHg, the estimated right ventricular systolic pressure is moderately elevated at 44.8 mmHg. Left Atrium: Left atrial size was normal in size. Right Atrium: Right atrial size was normal in size Pericardium: There is no evidence of pericardial effusion. Mitral Valve: The mitral valve is normal in structure. There is mild thickening of the mitral valve leaflet(s). Trivial mitral valve regurgitation. No evidence of mitral valve stenosis by observation. Tricuspid Valve: The tricuspid valve is normal in structure. Tricuspid valve regurgitation is trivial. Aortic Valve: The aortic valve is tricuspid. . There is mild thickening and mild calcification of the aortic valve. Aortic valve regurgitation is trivial. The aortic valve is structurally normal, with no evidence of sclerosis or stenosis. There is mild thickening of the aortic valve. There is mild calcification of the aortic valve. Pulmonic Valve: The pulmonic valve was normal in structure. Pulmonic valve regurgitation is not visualized. Pulmonic regurgitation is not visualized. Aorta: The aortic root, ascending aorta and aortic arch are all structurally normal, with no evidence of dilitation or obstruction. Venous: The inferior vena cava is dilated in size with less than 50% respiratory variability, suggesting right atrial pressure of 15 mmHg. IAS/Shunts: No atrial level  shunt detected by color flow Doppler. There is no evidence of a patent foramen ovale. No ventricular septal defect is seen or detected. There is no evidence of an atrial septal defect.  LEFT VENTRICLE PLAX 2D LVIDd:         5.20 cm LVIDs:         4.60 cm LV PW:         1.20 cm LV IVS:        1.20 cm LVOT diam:     2.10 cm LV SV:         32 ml LV SV Index:   12.71 LVOT Area:     3.46 cm  LV Volumes (MOD) LV area d, A2C:    47.00 cm LV area d, A4C:    59.90 cm LV area s, A2C:    36.30 cm LV area s, A4C:    49.10 cm LV major d, A2C:   9.44 cm LV major d, A4C:   10.20 cm LV major s, A2C:   8.16 cm LV major s, A4C:   9.55 cm LV vol d, MOD A2C: 195.0 ml LV vol d, MOD A4C: 290.0 ml LV vol s, MOD A2C: 136.0 ml LV vol s, MOD A4C: 212.0 ml LV SV MOD A2C:     59.0 ml LV SV MOD A4C:     290.0 ml LV SV MOD BP:      63.5 ml  RIGHT VENTRICLE TAPSE (M-mode): 1.3 cm LEFT ATRIUM             Index       RIGHT ATRIUM           Index LA diam:        3.60 cm 1.53 cm/m  RA Area:     21.00 cm LA Vol (A2C):   56.7 ml 24.04 ml/m RA Volume:   59.50 ml  25.23 ml/m LA Vol (A4C):   47.6 ml 20.18 ml/m LA Biplane Vol: 54.2 ml 22.98 ml/m  AORTIC VALVE LVOT Vmax:   117.00 cm/s LVOT Vmean:  69.800 cm/s LVOT VTI:    0.167 m  AORTA Ao Root diam: 3.20 cm TRICUSPID VALVE TR Peak grad:   29.8 mmHg TR Vmax:        273.00 cm/s  SHUNTS Systemic VTI:  0.17 m Systemic Diam: 2.10 cm  Candee Furbish MD Electronically signed by Candee Furbish MD Signature Date/Time: 05/25/2019/8:58:28 AM    Final    ECHOCARDIOGRAM COMPLETE  Result Date: 05/23/2019   ECHOCARDIOGRAM REPORT   Patient Name:   Xavier Doyle Date of Exam: 05/23/2019 Medical Rec #:  [id number]   Height:       68.0 in Accession #:    [id number]  Weight:       270.8 lb Date of Birth:  1960-11-01   BSA:          2.32 m Patient Age:    82 years    BP:           153/94 mmHg Patient Gender: M           HR:           77 bpm. Exam Location:  Inpatient Procedure: 2D Echo, Cardiac Doppler and Color Doppler  Indications:    Chest pain  History:        Patient has no prior history of Echocardiogram examinations.                 Signs/Symptoms:Chest Pain; Risk Factors:Hypertension and                 Dyslipidemia. Alcohol abuse, Cocaine abuse.  Sonographer:    Dustin Flock Referring Phys: [id number] Darreld Mclean  Sonographer Comments: Patient is morbidly obese. IMPRESSIONS  1. Left ventricular ejection fraction, by visual estimation, is 25 to 30%. The left ventricle has severely decreased function. There is moderately increased left ventricular hypertrophy.  2. Left ventricular diastolic parameters are consistent with Grade I diastolic dysfunction (impaired relaxation).  3. The left ventricle demonstrates global hypokinesis.  4. Global right ventricle has mildly reduced systolic function.The right ventricular size is normal. No increase in right ventricular wall thickness.  5. Left atrial size was mildly dilated.  6. Right atrial size was mildly dilated.  7. The mitral valve is normal in structure. Mild mitral valve regurgitation. No evidence of mitral stenosis.  8. The tricuspid valve is normal in structure. Tricuspid valve regurgitation is not demonstrated.  9. The aortic valve is tricuspid. Aortic valve regurgitation is mild. Mild aortic valve sclerosis without stenosis. 10. The inferior vena cava is normal in size with greater than 50% respiratory variability, suggesting right atrial pressure of 3 mmHg. 11. TR signal is inadequate for assessing pulmonary artery systolic pressure. FINDINGS  Left Ventricle: Left ventricular ejection fraction, by visual estimation, is 25 to 30%. The left ventricle has severely decreased function. The left ventricle demonstrates global hypokinesis. The left ventricular  internal cavity size was the left ventricle is normal in size. There is moderately increased left ventricular hypertrophy. Left ventricular diastolic parameters are consistent with Grade I diastolic dysfunction  (impaired relaxation). Right Ventricle: The right ventricular size is normal. No increase in right ventricular wall thickness. Global RV systolic function is has mildly reduced systolic function. Left Atrium: Left atrial size was mildly dilated. Right Atrium: Right atrial size was mildly dilated Pericardium: There is no evidence of pericardial effusion. Mitral Valve: The mitral valve is normal in structure. Mild mitral valve regurgitation. No evidence of mitral valve stenosis by observation. Tricuspid Valve: The tricuspid valve is normal in structure. Tricuspid valve regurgitation is not demonstrated. Aortic Valve: The aortic valve is tricuspid. Aortic valve regurgitation is mild. Mild aortic valve sclerosis is present, with no evidence of aortic valve stenosis. Pulmonic Valve: The pulmonic valve was normal in structure. Pulmonic valve regurgitation is not visualized. Pulmonic regurgitation is not visualized. Aorta: The aortic root is normal in size and structure. Venous: The inferior vena cava is normal in size with greater than 50% respiratory variability, suggesting right atrial pressure of 3 mmHg. IAS/Shunts: No atrial level shunt detected by color flow Doppler.  LEFT VENTRICLE PLAX 2D LVIDd:         5.52 cm  Diastology LVIDs:         4.56 cm  LV e' lateral:   5.87 cm/s LV PW:         1.43 cm  LV E/e' lateral: 14.5 LV IVS:        1.49 cm  LV e' medial:    5.55 cm/s LVOT diam:     2.10 cm  LV E/e' medial:  15.4 LV SV:         53 ml LV SV Index:   21.44 LVOT Area:     3.46 cm  RIGHT VENTRICLE RV Basal diam:  2.62 cm RV S prime:     9.68 cm/s TAPSE (M-mode): 3.4 cm LEFT ATRIUM           Index       RIGHT ATRIUM           Index LA diam:      3.80 cm 1.63 cm/m  RA Area:     18.90 cm LA Vol (A2C): 33.0 ml 14.20 ml/m RA Volume:   53.00 ml  22.80 ml/m LA Vol (A4C): 83.1 ml 35.75 ml/m  AORTIC VALVE LVOT Vmax:   83.30 cm/s LVOT Vmean:  55.900 cm/s LVOT VTI:    0.160 m  AORTA Ao Root diam: 3.10 cm MITRAL VALVE MV  Area (PHT): 4.68 cm             SHUNTS MV PHT:        46.98 msec           Systemic VTI:  0.16 m MV Decel Time: 162 msec             Systemic Diam: 2.10 cm MV E velocity: 85.30 cm/s 103 cm/s MV A velocity: 67.30 cm/s 70.3 cm/s MV E/A ratio:  1.27       1.5  Loralie Champagne MD Electronically signed by Loralie Champagne MD Signature Date/Time: 05/23/2019/3:37:26 PM    Final    VAS [place] UPPER EXTREMITY VENOUS DUPLEX  Result Date: 06/01/2019 UPPER VENOUS STUDY  Indications: Swelling Limitations: Poor ultrasound/tissue interface, bandages and line. Performing Technologist: Antonieta Pert RDMS, RVT  Examination Guidelines: A complete evaluation includes B-mode imaging, spectral Doppler, color Doppler,  and power Doppler as needed of all accessible portions of each vessel. Bilateral testing is considered an integral part of a complete examination. Limited examinations for reoccurring indications may be performed as noted.  Right Findings: +----------+------------+---------+-----------+----------+-------+ RIGHT     CompressiblePhasicitySpontaneousPropertiesSummary +----------+------------+---------+-----------+----------+-------+ Subclavian    Full       Yes       Yes                      +----------+------------+---------+-----------+----------+-------+  Left Findings: +----------+------------+---------+-----------+----------+-------------------+ LEFT      CompressiblePhasicitySpontaneousProperties      Summary       +----------+------------+---------+-----------+----------+-------------------+ IJV           Full       Yes       Yes                                  +----------+------------+---------+-----------+----------+-------------------+ Subclavian    Full       Yes       Yes                                  +----------+------------+---------+-----------+----------+-------------------+ Axillary      Full       Yes       Yes                                   +----------+------------+---------+-----------+----------+-------------------+ Brachial      Full                                                      +----------+------------+---------+-----------+----------+-------------------+ Radial                                                Not visualized    +----------+------------+---------+-----------+----------+-------------------+ Ulnar                                                 Not visualized    +----------+------------+---------+-----------+----------+-------------------+ Cephalic      Full                                                      +----------+------------+---------+-----------+----------+-------------------+ Basilic     Partial                                 not well visualized +----------+------------+---------+-----------+----------+-------------------+  Summary:  Right: No evidence of thrombosis in the subclavian.  Left: No evidence of deep vein thrombosis in the upper extremity. No evidence of superficial vein thrombosis in the upper extremity. Left forearm covered by bandages.  *See table(s) above for measurements and observations.  Diagnosing physician:  Deitra Mayo MD Electronically signed by Deitra Mayo MD on 06/01/2019 at 7:50:35 AM.    Final     Consults: Treatment Team:  Lind Covert, MD   Subjective:    Overnight Issues: Febrile overnight up to 100.4 F.  Remains on cefepime and vancomycin.  Objective:  Vital signs for last 24 hours: Temp:  [98.7 F (37.1 C)-100.4 F (38 C)] 100.4 F (38 C) (02/03 0400) Pulse Rate:  [76-119] 76 (02/03 0600) Resp:  [28-31] 30 (02/03 0600) BP: (74-204)/(56-146) 74/56 (02/03 0600) SpO2:  [73 %-100 %] 94 % (02/03 0600) FiO2 (%):  [50 %-100 %] 60 % (02/03 0430) Weight:  GI:[id number] kg] 118 kg (02/03 0500)  Intake/Output from previous day: 02/02 0701 - 02/03 0700 In: 2443.3 [I.V.:441.1; NG/GT:1082.5; IV Piggyback:919.7] Out: 2710  [Urine:2110; Stool:600]  Intake/Output this shift: No intake/output data recorded.  Vent settings for last 24 hours: Vent Mode: PRVC FiO2 (%):  [50 %-100 %] 60 % Set Rate:  [30 bmp] 30 bmp Vt Set:  [540 mL] 540 mL PEEP:  [5 cmH20-8 cmH20] 5 cmH20 Plateau Pressure:  [22 [id number] cmH20] 22 cmH20  Physical Exam:  General: Ill-appearing Neuro: unresponsive to voice and painful stimulation, left-sided myoclonus appreciated HEENT/neck: Normocephalic, atraumatic, ET tube in place Resp: Rhonchi bilaterally, left greater than right, occasional productive cough CVS: Regular rate and rhythm, S1-S2 clear, no murmur appreciated Extremities: Trace edema to bilateral lower extremities, pulses within normal limits  Assessment/Plan:  Cardiogenic shock secondary to V. fib arrest on 06/04/2019 s/p TTM completion on 1/23 HFrEF (EF 25-30% G1DD) -Continue amiodarone400mg  BID. Hemodynamically improved -Atorvastatin80mg  daily - could discontinue d/t recent change to plan of care  Fever ON, improving WBC count: WBC 31.7 > 26.6, fever ON 100.4*F 06/08/2019.  - Follow-up blood cultures - Follow-up sputum cultures - Cefepime and Vancomycin per pharmacy - Tylenol PRN fevers  Hypoxic ischemic encephalopathy  Stimulation induced myoclonus Patient continues to have myoclonus, has improved with anticonvulsant therapy.  Patient successfully weaned off of sedative infusions.  Due to the extensive neurological injury the patient has a poor prognosis. -ContinueVersed infusion 10mg /hr,Fentanyl 100-339mcg/hr -ContinueValproic Acid 500mg  BID per tube and Keppra 1000mg  BID - Seizure Precautions in place  -Clonazepam 2mg  BID per tube - Oxycodone 5mg  q6 per tube - Quetiapine 25mg  daily  Acute hypoxemic respiratory failure, secondary to pulmonary edema from [facility] acquired/Aspiration Pneumonia. Diuresed 02/02 Lasix IV 80 x1.  Bronchoscopy 02/02 revealed feed-appearing material in the lungs  concerning for aspiration. - Stop feeding supplement - Cefepime and vancomycin per pharmacy -Continue pulmonary hygiene, Chest physiotherapy - Torsemide 20 mg p.o. daily  Acute renal failure due to cardiorenal syndrome:serum creatinine continues toimprove. Continued stable urine output, produced 2.1L urine output 02/02 after receiving Lasix IV 80 mg. - Monitor BMP, urine output - Torsemide 20 mg daily p.o.  Hypernatremia, slightly improved: 153 > 149 06/07/2019. Patient continues to produce urine.High risk for DI given his brain injury. - Discontinuefree waterper tube - Follow up BMP 06/08/2019. -May need to start D5W to account for free water losses if this actually is DI  T2DM: A1c 6.4%. CBG 02/01 87-215. - SSI q4 hours PRN Hyperglycemia  - CBG checks every 4 hours - Levemir 10 units daily  DVT Prophylaxis: Heparin 5,000 units q8   LOS: 15 days   Milus Banister, [place], PGY-2 06/08/2019 8:10 AM

## 2019-06-08 NOTE — Progress Notes (Signed)
FPTS continues to socially follow this patient.  Appreciate care provided by CCM in the ICU.  We will be happy to resume care when he is transferred to the floor. Family was contacted by palliative 2/1. Tube feed stopped this morning. Had a better night per RN.   Gerlene Fee, D.O.  PGY-1 Family Medicine  06/08/2019 9:15 AM

## 2019-06-09 LAB — BASIC METABOLIC PANEL
Anion gap: 13 (ref 5–15)
BUN: 124 mg/dL — ABNORMAL HIGH (ref 6–20)
CO2: 21 mmol/L — ABNORMAL LOW (ref 22–32)
Calcium: 9.3 mg/dL (ref 8.9–10.3)
Chloride: 119 mmol/L — ABNORMAL HIGH (ref 98–111)
Creatinine, Ser: 3.55 mg/dL — ABNORMAL HIGH (ref 0.61–1.24)
GFR calc Af Amer: 21 mL/min — ABNORMAL LOW (ref >60)
GFR calc non Af Amer: 18 mL/min — ABNORMAL LOW (ref >60)
Glucose, Bld: 134 mg/dL — ABNORMAL HIGH (ref 70–99)
Potassium: 3.9 mmol/L (ref 3.5–5.1)
Sodium: 153 mmol/L — ABNORMAL HIGH (ref 135–145)

## 2019-06-09 LAB — CBC WITH DIFFERENTIAL/PLATELET
Abs Immature Granulocytes: 0.42 10*3/uL — ABNORMAL HIGH (ref 0.00–0.07)
Basophils Absolute: 0.1 10*3/uL (ref 0.0–0.1)
Basophils Relative: 0 %
Eosinophils Absolute: 0.4 10*3/uL (ref 0.0–0.5)
Eosinophils Relative: 2 %
HCT: 30.4 % — ABNORMAL LOW (ref 39.0–52.0)
Hemoglobin: 9.1 g/dL — ABNORMAL LOW (ref 13.0–17.0)
Immature Granulocytes: 2 %
Lymphocytes Relative: 13 %
Lymphs Abs: 2.7 10*3/uL (ref 0.7–4.0)
MCH: 23.1 pg — ABNORMAL LOW (ref 26.0–34.0)
MCHC: 29.9 g/dL — ABNORMAL LOW (ref 30.0–36.0)
MCV: 77.2 fL — ABNORMAL LOW (ref 80.0–100.0)
Monocytes Absolute: 1.6 10*3/uL — ABNORMAL HIGH (ref 0.1–1.0)
Monocytes Relative: 8 %
Neutro Abs: 15.8 10*3/uL — ABNORMAL HIGH (ref 1.7–7.7)
Neutrophils Relative %: 75 %
Platelets: 614 10*3/uL — ABNORMAL HIGH (ref 150–400)
RBC: 3.94 MIL/uL — ABNORMAL LOW (ref 4.22–5.81)
RDW: 17 % — ABNORMAL HIGH (ref 11.5–15.5)
WBC: 21.1 10*3/uL — ABNORMAL HIGH (ref 4.0–10.5)
nRBC: 0 % (ref 0.0–0.2)

## 2019-06-09 LAB — GLUCOSE, CAPILLARY
Glucose-Capillary: 112 mg/dL — ABNORMAL HIGH (ref 70–99)
Glucose-Capillary: 110 mg/dL — ABNORMAL HIGH (ref 70–99)
Glucose-Capillary: 107 mg/dL — ABNORMAL HIGH (ref 70–99)
Glucose-Capillary: 97 mg/dL (ref 70–99)
Glucose-Capillary: 106 mg/dL — ABNORMAL HIGH (ref 70–99)
Glucose-Capillary: 121 mg/dL — ABNORMAL HIGH (ref 70–99)

## 2019-06-09 LAB — VANCOMYCIN, RANDOM: Vancomycin Rm: 22

## 2019-06-09 MED ORDER — VANCOMYCIN VARIABLE DOSE PER UNSTABLE RENAL FUNCTION (PHARMACIST DOSING): Status: DC

## 2019-06-09 MED ORDER — VANCOMYCIN HCL 750 MG/150ML IV SOLN
750.0000 mg | INTRAVENOUS | Status: DC
  Administered 2019-06-10: 16:00:00 750 mg via INTRAVENOUS
  Filled 2019-06-09 (×2): qty 150

## 2019-06-09 MED ORDER — SODIUM CHLORIDE 0.9 % IV SOLN
2.0000 g | INTRAVENOUS | Status: DC
  Administered 2019-06-10 (×2): 2 g via INTRAVENOUS
  Filled 2019-06-09 (×2): qty 2

## 2019-06-09 NOTE — Progress Notes (Signed)
Nutrition Follow up  DOCUMENTATION CODES:   Obesity unspecified  INTERVENTION:   Family to arrive tonight for [place] meeting. Likely to transition to comfort care tomorrow. TF d/c.   Recommendations if needed:  -Vital AF @ 50 ml/hr via Cortrak (1200 ml) -60 ml Prostat BID  Provides: 1840 kcals, 150 grams protein, 973 ml free water. Meets 108% kcal needs and 100% protein needs.   NUTRITION DIAGNOSIS:   Increased nutrient needs related to acute illness as evidenced by estimated needs.  Ongoing  GOAL:   Patient will meet greater than or equal to 90% of their needs   Addressed via TF  MONITOR:   Weight trends, Diet advancement, Vent status, Skin, TF tolerance, I & O's  REASON FOR ASSESSMENT:   Ventilator    ASSESSMENT:   Patient with PMH significant for HLD, HTN, OSA, ETOH abuse, and asthma. Presents this admission with new CHF.   1/19- L/R heart cath, s/p PEA arrest, intubated  1/22- s/p post pyloric Cortrak   1/23- completed TTM   RD working remotely.  Pt remains unresponsive. Hypernatremia worse today. Underwent bronchoscopy on 2/2. Initial concern for aspiration given color of output, TF stopped. It's highly unlikely this is tube feeding given Cortrak tip shows to be at the proximal jejunum. No plans to restart feeding as care likely to transition to comfort measures tomorrow.   Admission weight: 124.7 kg   Current weight: 118.4 kg   Patient is currently intubated on ventilator support MV: 15 L/min Temp (24hrs), Avg:101.2 F (38.4 C), Min:100.2 F (37.9 C), Max:102.7 F (39.3 C)   I/O: -12,275 ml since 1/21 UOP: 1,400 ml x 24 hrs  Rectal tube: 500 ml x 24 hrs   Drips: fentanyl, versed Medications: SS novolog, levemir, demadex Labs: Na 153 (H) Cr 3.55-tending up BUN 124 CBG 107-156  Diet Order:   Diet Order    None      EDUCATION NEEDS:   Not appropriate for education at this time  Skin:  Skin Assessment: Skin Integrity Issues: Skin Integrity  Issues:: Stage I, DTI, Stage II DTI: R head Stage I: L head Stage II: buttocks  Last BM:  2/3  Height:   Ht Readings from Last 1 Encounters:  05/20/2019 5\' 8"  (1.727 m)    Weight:   Wt Readings from Last 1 Encounters:  06/09/19 118.4 kg    Ideal Body Weight:  70 kg  BMI:  Body mass index is 39.69 kg/m.  Estimated Nutritional Needs:   Kcal:  1343- 1709 kcal  Protein:  140-175 grams  Fluid:  >/= 1.7 L/day   Mariana Single RD, LDN Clinical Nutrition Pager # - [phone]

## 2019-06-09 NOTE — Progress Notes (Signed)
Pharmacy Antibiotic Note  Xavier Doyle is a 59 y.o. male s/p VF arrest and acute encephalopathy. He was noted with fever and increasing WBC. He is also noted with AKI  Pharmacy has been consulted for vancomycin and cefepime dosing. Family flying in- possible transition to comfort care on 2/5 -WBC= 21, tmax= 102.7, SCr= 3.55 (0.9 on 05/23/2019), CrCl ~ 50  Plan: -Change cefepime to 2gm IV q24 -Hold vancomycin and check a random level today -Will follow renal function, cultures and clinical progress    Height: 5\' 8"  (172.7 cm) Weight: 261 lb 0.4 oz (118.4 kg) IBW/kg (Calculated) : 68.4  Temp (24hrs), Avg:101.3 F (38.5 C), Min:100.2 F (37.9 C), Max:102.7 F (39.3 C)  Recent Labs  Lab 06/05/19 0522 06/05/19 1925 06/06/19 0332 06/07/19 0246 06/07/19 1005 06/08/19 0219 06/09/19 0210  WBC 21.5*  --  23.8* 31.7*  --  26.6* 21.1*  CREATININE 2.22* 2.07* 2.06*  --  2.14*  --  3.55*    Estimated Creatinine Clearance: 28.4 mL/min (A) (by C-G formula based on SCr of 3.55 mg/dL (H)).    No Known Allergies  Antimicrobials this admission: Vancomycin 1/21 >> 1/23; 2/2>> Cefepime 1/21 >> 1/27; 2/2>>  Dose adjustments this admission:    Microbiology results: 1/22 sputum- staph aureus 2/2 blood x2- ngtd  Thank you for allowing pharmacy to be a part of this patient's care.  Hildred Laser, PharmD Clinical Pharmacist **Pharmacist phone directory can now be found on [email or web address] ([email or web address]).  Listed under [place].

## 2019-06-09 NOTE — Progress Notes (Signed)
Holding 1200 CPT due to patient having seizure.  Tolerating current ventilator settings well.  Will continue to monitor.

## 2019-06-09 NOTE — Progress Notes (Signed)
[facility]PTS continues to socially follow this patient.  Appreciate care provided by CCM in the ICU.  We will be happy to resume care when he is transferred to the floor. Family was contacted by palliative 2/1. Family to fly in from [place] tomorrow.   Gerlene Fee, D.O.  PGY-1 Family Medicine  06/09/2019 2:23 PM

## 2019-06-09 NOTE — Progress Notes (Addendum)
NAME:  Xavier Doyle, MRN:  [id number], DOB:  [birth year], LOS: 15 ADMISSION DATE:  05/17/2019, CONSULTATION DATE:  05/06/2019 REFERRING MD: Family medicine - Talbert Cage, MD CHIEF COMPLAINT: Anoxic brain injury status post cardiac arrest  Brief History   59 year old man status post cardiac arrest with poor neurological recovery.  History of present illness   Xavier Doyle is a 59 year old male with history of hypertension, hyperlipidemia, asthma, and depression admitted 05/19/2019 for chest pain.  Patient found to have newly diagnosed systolic heart failure with ejection fraction 25-30% with global hypokinesis.  Patient had cardiac arrest during hospitalization, suffered anoxic brain injury with poor neurological recovery.  Family has agreed to modified DNR status until family can fly in from [place] to be with patient, at which point he will most likely be transitioned to comfort care.  Past Medical History   Patient Active Problem List   Diagnosis Date Noted   Adult failure to thrive    Acute respiratory failure ([place])    Anoxic brain damage ([place])    Palliative care by specialist    DNR (do not resuscitate) discussion    Endotracheally intubated    Pressure injury of skin 05/28/2019   Respiratory failure with hypoxia ([place]) 05/27/2019   Nonischemic cardiomyopathy ([place]) 05/25/2019   Chest pain 05/25/2019   Cardiac arrest [facility])    Ventricular fibrillation ([place])    Prediabetes 05/23/2019   Hyperlipidemia 05/23/2019   Morbid obesity ([place]) 05/23/2019   Essential hypertension 05/23/2019   Acute HFrEF (heart failure with reduced ejection fraction) ([place])    Chest pain with high risk for cardiac etiology 05/28/2019   Significant Hospital Events   1/17 admitted to [facility] for chest pain 1/19 V. 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  1/28 titrated off of dobutamine 2/01 made partial DNR (can have medicinal resuscitation, intubated; no chest compressions) 2/02 bronchoscopy to obtain sputum culture, assess new CXR opacity  Consults:  Palliative care, case management, critical care  Procedures:  1/19: EEG initiated, stopped 1/21 1/20: TTE 1/24: EEG due to myoclonic activity 2/02: bronchoscopy  Significant Diagnostic Tests:  DG Chest 1 View  Result Date: 06/04/2019 CLINICAL DATA:  Central line placement OG tube placement EXAM: CHEST  1 VIEW COMPARISON:  06/04/2019 FINDINGS: Endotracheal tube tip is about 2.7 cm superior to the carina. Esophageal tube tip below the diaphragm but incompletely visualized. Left-sided central venous catheter tip partially obscured by support device, appears to be over the upper SVC. No pneumothorax. Low lung volumes. Enlarged cardiomediastinal silhouette. Increasing airspace disease at the left base. Right lung grossly clear. IMPRESSION: 1. Endotracheal tube tip about 2.7 cm superior to carina. Esophageal tube tip below the diaphragm but incompletely visualized 2. Left IJ central venous catheter tip partially obscured, appears to overlie the SVC origin. No left pneumothorax 3. Increasing airspace disease at the left base.  Cardiomegaly. Electronically Signed   By: Donavan Foil M.D.   On: 05/18/2019 19:16   DG Chest 2 View  Result Date: 05/21/2019 CLINICAL DATA:  Chest pain, shortness of breath. EXAM: CHEST - 2 VIEW COMPARISON:  None. FINDINGS: Mild cardiomegaly  is noted. No pneumothorax or significant pleural effusion is noted. Lungs are clear. Bony thorax is unremarkable. IMPRESSION: No active cardiopulmonary disease.  Electronically Signed   By: Marijo Conception M.D.   On: 05/15/2019 11:33   DG Abd 1 View  Result Date: 05/23/2019 CLINICAL DATA:  OG tube placement EXAM: ABDOMEN - 1 VIEW COMPARISON:  None. FINDINGS: Airspace disease at the left base. Esophageal tube tip overlies the distal stomach. IMPRESSION: Esophageal tube tip overlies the distal stomach. Electronically Signed   By: Donavan Foil M.D.   On: 05/28/2019 19:17   CARDIAC CATHETERIZATION  Result Date: 06/04/2019  Mid RCA lesion is 10% stenosed. No significant CAD.  LV end diastolic pressure is moderately elevated. LVEDP 26 mm Hg.  There is no aortic valve stenosis.  Hemodynamic findings consistent with mild pulmonary hypertension.  Ao sat 96%, PA sat 67%, mean PA pressure 29 mm Hg; mean PCWP 20; CO 6.3 L/min, CI 2.72  Medical therapy for nonischemic cardiomyopathy.   DG CHEST PORT 1 VIEW  Result Date: 06/08/2019 CLINICAL DATA:  Coronary artery disease, hypertension, asthma, previous tobacco abuse EXAM: PORTABLE CHEST 1 VIEW COMPARISON:  06/07/2019 FINDINGS: Single frontal view of the chest demonstrates stable endotracheal tube and enteric catheter. Improved aeration within the left lung, with persistent consolidation at the left lung base obscuring the left hemidiaphragm. Left pleural effusion not excluded. Continued central vascular congestion bilaterally, left greater than right. Otherwise the right chest is clear. No pneumothorax. IMPRESSION: 1. Improving aeration within the left lung, with persistent consolidation and/or effusion at the left lung base. 2. Stable central vascular congestion. 3. Stable support devices. Electronically Signed   By: Randa Ngo M.D.   On: 06/08/2019 11:57   DG CHEST PORT 1 VIEW  Result Date: 06/07/2019 CLINICAL DATA:  LEFT lung consolidation EXAM: PORTABLE CHEST 1 VIEW COMPARISON:  Radiograph 06/07/2019 at 957 hours FINDINGS: Endotracheal tube and feeding tube unchanged. Near complete opacification of the LEFT  hemithorax. There is some improved aeration in LEFT upper . RIGHT lung relatively clear with mild interstitial edema pattern. IMPRESSION: 1. Some mild improvement in aeration to the LEFT upper lobe. The majority of the LEFT hemithorax remains opacified. 2. Stable support apparatus. 3. Mild interstitial edema pattern in the well aerated RIGHT lung. Electronically Signed   By: Suzy Bouchard M.D.   On: 06/07/2019 16:03   DG CHEST PORT 1 VIEW  Result Date: 06/07/2019 CLINICAL DATA:  Fevers. EXAM: PORTABLE CHEST 1 VIEW COMPARISON:  06/06/2019 FINDINGS: ET tube tip is above the carina. There is a feeding tube with tip below the GE junction. Interval complete opacification of the left lung is identified compared with 06/06/2019. Findings are favored to represent complete atelectasis of the left lung which may reflect underlying mucous plugging. Increase interstitial markings within the right base also noted. IMPRESSION: Complete opacification of the left lung favored to represent complete atelectasis of the left lung which may reflect underlying mucous plugging. Follow-up imaging advised to ensure resolution. Increased interstitial markings noted within the right base. These results will be called to the ordering clinician or representative by the Radiologist Assistant, and communication documented in the PACS or zVision Dashboard. Electronically Signed   By: Kerby Moors M.D.   On: 06/07/2019 10:10   DG CHEST PORT 1 VIEW  Result Date: 06/06/2019 CLINICAL DATA:  Status post cardiac arrest. EXAM: PORTABLE CHEST 1 VIEW COMPARISON:  06/05/2019 FINDINGS: The endotracheal tube is 3.8 cm above the carina. The feeding tube is  coursing down the esophagus and into the stomach. Improved lung aeration with resolving right lower lobe atelectasis. No pneumothorax. IMPRESSION: Stable support apparatus. Improved lung aeration with resolving right lower lobe atelectasis. Electronically Signed   By: Marijo Sanes M.D.   On:  06/06/2019 06:46   DG Chest Port 1 View  Result Date: 06/05/2019 CLINICAL DATA:  Ventilator support. EXAM: PORTABLE CHEST 1 VIEW COMPARISON:  05/31/2019 FINDINGS: Endotracheal tube tip is 5 cm above the carina. Soft feeding tube enters the abdomen. Left internal jugular central line tip at the innominate SVC junction. Improved aeration in the left lower lobe. Worsened atelectasis in the right lower and middle lobes. Upper lungs remain clear. IMPRESSION: Lines and tubes satisfactory. Improved aeration in the left lower lobe. Worsened volume loss in the right middle lobe and lower lobe. Electronically Signed   By: Nelson Chimes M.D.   On: 06/05/2019 06:43   DG Chest Port 1 View  Result Date: 05/31/2019 CLINICAL DATA:  Acute respiratory failure EXAM: PORTABLE CHEST 1 VIEW COMPARISON:  Radiograph 05/30/2019 FINDINGS: *Endotracheal tube position in the mid trachea, 4 cm from the carina. *Transesophageal feeding tube tip terminates beyond the GE junction, below the level of imaging. *Left IJ central venous catheter tip terminates at the brachiocephalic-caval confluence. *Telemetry leads overlie the chest. There are patchy bilateral airspace opacities more focal confluent opacity is seen in the left lung base with obscuration of left hemidiaphragm which could reflect a combination of pleural fluid and parenchymal disease. No pneumothorax. Visualized cardiomediastinal contours are similar to prior accounting for differences in technique. No acute osseous or soft tissue abnormality. IMPRESSION: 1. Patchy bilateral airspace opacities are similar to prior. 2. More confluent opacity in the left lung base is slightly decreased from prior. Could reflect a combination of pleural fluid and parenchymal disease. 3. Lines and tubes as above Electronically Signed   By: Lovena Le M.D.   On: 05/31/2019 03:34   DG CHEST PORT 1 VIEW  Result Date: 05/30/2019 CLINICAL DATA:  Ventilator support.  Follow-up. EXAM: PORTABLE CHEST  1 VIEW COMPARISON:  05/28/2019 FINDINGS: Endotracheal tube tip is 4 cm above the carina. Soft feeding tube enters the abdomen. Left internal jugular central line tip is in the SVC at the azygos level. Right lung shows improvement with better aeration in the lower lobe. Worsened opacity in the left hemithorax with less aerated upper lobe. Findings could be consistent with consolidation and pleural fluid. IMPRESSION: Worsened appearance on the left with less aerated lung. Consolidation and possible pleural fluid. Improved aeration of the right lung base. Electronically Signed   By: Nelson Chimes M.D.   On: 05/30/2019 09:20   DG CHEST PORT 1 VIEW  Result Date: 05/28/2019 CLINICAL DATA:  Dyspnea EXAM: PORTABLE CHEST 1 VIEW COMPARISON:  05/27/2019 FINDINGS: Moderate interstitial/airspace opacities in the lungs bilaterally. Small to moderate left pleural effusion. No pneumothorax. Cardiomegaly. Endotracheal tube terminates 3.5 cm above the carina. Left IJ venous catheter terminates in the mid SVC. Enteric tube courses into the stomach. IMPRESSION: Moderate interstitial/airspace opacities in the lungs bilaterally, favoring interstitial edema over multifocal pneumonia, unchanged. Small to moderate left pleural effusion. Endotracheal tube terminates 3.5 cm above the carina. Additional support apparatus as above. Electronically Signed   By: Julian Hy M.D.   On: 05/28/2019 10:52   DG CHEST PORT 1 VIEW  Result Date: 05/27/2019 CLINICAL DATA:  Ventricular fibrillation, CHF.  Endotracheal tube. EXAM: PORTABLE CHEST 1 VIEW COMPARISON:  05/26/2019. FINDINGS: Endotracheal tube, NG tube,  left IJ line in stable position. Cardiomegaly with progressive bilateral pulmonary infiltrates/edema. Small bilateral pleural effusions. Findings consistent with progressive CHF. Bilateral pneumonia cannot be excluded. No pneumothorax. IMPRESSION: 1.  Lines and tubes in stable position. 2. Cardiomegaly with progressive bilateral  pulmonary infiltrates/edema. Small bilateral pleural effusions. Findings consistent progressive CHF. Bilateral pneumonia cannot be excluded. Electronically Signed   By: Marcello Moores  Register   On: 05/27/2019 05:50   DG CHEST PORT 1 VIEW  Result Date: 05/26/2019 CLINICAL DATA:  Dyspnea. Evaluate for possible aspiration. Intubated patient. EXAM: PORTABLE CHEST 1 VIEW COMPARISON:  05/18/2019 FINDINGS: There is opacity at both lung bases, likely a combination of atelectasis and pleural fluid. This has increased from the prior study. There is also bilateral perihilar opacity increased from the prior exam. These findings are accentuated by low lung volumes. Endotracheal tube tip projects 2 point 8 cm above the carina. Left internal jugular central venous catheter has its tip at the confluence of the brachiocephalic vein and superior vena cava. Nasal/orogastric tube passes below the diaphragm well into the stomach. IMPRESSION: 1. Worsened lung aeration compared to the prior study. There are perihilar bilateral lung base opacities. Suspect combination of pulmonary edema with lung base atelectasis and pleural effusions. Pneumonia or aspiration pneumonitis is not excluded. 2. Stable support apparatus. Electronically Signed   By: Lajean Manes M.D.   On: 05/26/2019 15:49   DG Chest Port 1 View  Result Date: 05/31/2019 CLINICAL DATA:  Status post intubation. EXAM: PORTABLE CHEST 1 VIEW COMPARISON:  May 22, 2019 FINDINGS: An endotracheal tube is seen with its distal tip approximately 4.7 cm from the carina. This represents a new finding when compared to the prior study. Mildly radiopaque tags and labels are seen overlying the left lung with subsequently limited evaluation of the pulmonary parenchyma within these regions. There is no evidence of acute infiltrate, pleural effusion or pneumothorax. There is mild to moderate severity enlargement of the cardiac silhouette. The visualized skeletal structures are unremarkable.  IMPRESSION: 1. Interval endotracheal tube placement and positioning, as described above, when compared to the prior chest plain film dated May 22, 2019. Electronically Signed   By: Virgina Norfolk M.D.   On: 05/22/2019 17:43   DG Abd Portable 1V  Result Date: 06/07/2019 CLINICAL DATA:  OG tube placement. EXAM: PORTABLE ABDOMEN - 1 VIEW COMPARISON:  05/27/2019 FINDINGS: Feeding tube tip appears to be within the proximal jejunum beyond the ligament of Treitz in the same position as on the prior study. The visualized bowel gas pattern is normal. Dense consolidation in the left mid and lower lung zones. IMPRESSION: Feeding tube tip appears to be in the proximal jejunum. Electronically Signed   By: Lorriane Shire M.D.   On: 06/07/2019 12:02   DG Abd Portable 1V  Result Date: 05/27/2019 CLINICAL DATA:  Feeding tube placement EXAM: PORTABLE ABDOMEN - 1 VIEW COMPARISON:  Abdominal radiograph obtained earlier in the day FINDINGS: Nasogastric tube has been removed. Feeding tube now present with tip in proximal jejunum. There is paucity of bowel gas. No bowel dilatation or free air evident. There is consolidation in the left lower lobe as well as more patchy infiltrate in the right base. IMPRESSION: 1.  Feeding tube tip in proximal jejunum. 2. Paucity of bowel gas. This finding raises concern for a degree of ileus or enteritis. Bowel obstruction less likely. No free air. 3. Consolidation throughout the left lower lobe with more patchy infiltrate left base. Small left pleural effusion cannot be excluded.  Electronically Signed   By: Lowella Grip III M.D.   On: 05/27/2019 13:49   DG Abd Portable 1V  Result Date: 05/27/2019 CLINICAL DATA:  Impaired nasogastric feeding tube. EXAM: PORTABLE ABDOMEN - 1 VIEW COMPARISON:  One-view abdomen 05/20/2019. One-view chest x-ray 05/27/2019. FINDINGS: Side port of the NG tube is in the stomach. The stomach is decompressed. Left greater than right basilar airspace disease  is noted. The heart is enlarged. Bowel gas pattern is unremarkable. IMPRESSION: 1. Side port of the NG tube is in the stomach. 2. Left greater than right basilar airspace disease. Electronically Signed   By: San Morelle M.D.   On: 05/27/2019 05:14   EEG adult  Result Date: 05/12/2019 Lora Havens, MD     05/21/2019  9:05 PM Patient Name: Xavier Doyle MRN: [id number] Epilepsy Attending: Lora Havens Referring Physician/Provider: Merlene Laughter, NP Date: 05/22/2019 Duration: 21.03 mins Patient history: 59yo s/p cardiac arrest now on TTM. EEG to evaluate for seizure Level of alertness: comatose AEDs during EEG study: Propofol Technical aspects: This EEG study was done with scalp electrodes positioned according to the 10-20 International system of electrode placement. Electrical activity was acquired at a sampling rate of 500Hz  and reviewed with a high frequency filter of 70Hz  and a low frequency filter of 1Hz . EEG data were recorded continuously and digitally stored. DESCRIPTION: EEG showed continuous generalized background suppression. Intermittent generalized high amplitude sharply contoured 4-6zh theta slowing was also noted. Hyperventilation and photic stimulation were not performed. ABNORMALITY - Background suppression, generalized - Intermittent slow, generalized IMPRESSION: This study is showed evidence of profound diffuse encephalopathy, non specific to etiology. No seizures or definite epileptiform discharges were seen throughout the recording. Priyanka [name]   Overnight EEG with video  Result Date: 05/25/2019 Lora Havens, MD     05/26/2019  9:09 AM Patient Name: Xavier Doyle MRN: [id number] Epilepsy Attending: Lora Havens Referring Physician/Provider: Merlene Laughter, NP Duration: 05/10/2019 2048 to 05/25/2019 2048  Patient history: 59yo s/p cardiac arrest now on TTM. EEG to evaluate for seizure  Level of alertness: comatose  AEDs during EEG study: Propofol, Keppra  Technical  aspects: This EEG study was done with scalp electrodes positioned according to the 10-20 International system of electrode placement. Electrical activity was acquired at a sampling rate of 500Hz  and reviewed with a high frequency filter of 70Hz  and a low frequency filter of 1Hz . EEG data were recorded continuously and digitally stored.  DESCRIPTION: EEG showed continuous generalized background suppression. Intermittent generalized high amplitude sharply contoured 4-6zh theta slowing was also noted. Hyperventilation and photic stimulation were not performed. Event button was pressed multiple times (at 2340 and 2349 on 06/02/2019, at 1358, 1402, 1403, 1405, 1406, 1416, 1426, 1445, 1451, 1518, 1530, 1536, 1552, 1609, 1948 on 05/25/2019) for unclear reasons.  Concomitant EEG at times showed high amplitude sharply contoured 4 to 6 Hz theta slowing without clear evolution.  ABNORMALITY - Burst suppression, generalized  IMPRESSION: This study is showed evidence of profound diffuse encephalopathy, non specific to etiology. No seizures or definite epileptiform discharges were seen throughout the recording.  Priyanka [name]   EEG LTVM - Continuous Bedside W/ Video Includes Portable EEG Read  Result Date: 05/30/2019 Lora Havens, MD     05/30/2019  1:52 PM Patient Name:Xavier Doyle [id number] Epilepsy Attending:Priyanka [name] Referring Physician/Provider:Dr. Dyann Ruddle Duration:05/29/2019 1529 to 05/30/2019 1036  Patient history:58yo s/p cardiac arrest now on TTM. EEG to evaluate for seizure  Level  of alertness:comatose  AEDs during EEG study:Keppra, valproate, Versed, clonazepam  Technical aspects: This EEG study was done with scalp electrodes positioned according to the 10-20 International system of electrode placement. Electrical activity was acquired at a sampling rate of 500Hz  and reviewed with a high frequency filter of 70Hz  and a low frequency filter of 1Hz . EEG data were recorded  continuously and digitally stored.  DESCRIPTION: EEG showed continuous generalized background suppression. EEG was reactive to tactile stimulation. Hyperventilation and photic stimulation were not performed.  EKG artifact was seen throughout the study. Event button was pressed multiple times during the study (On 05/29/2019 1701 and 2026, on 05/30/2019 at 739, 0800 and 0854). On video, patient does appear to have whole-body twitching or right or left side twitching. Concomitant EEG before during and after the event and does not show any EEG change to suggest seizure.  ABNORMALITY -Backgroundsuppression, generalized   IMPRESSION: This study isshowed evidence of profound diffuse encephalopathy, non specific to etiology. No seizures ordefiniteepileptiform discharges were seen throughout the recording. Event button was pressed multiple times as described above for whole body twitching and left or right leg twitching without concomitant EEG change and is therefore not epileptic. Of note, it appears that these episodes may be triggered by stimulation and given the semiology of the events could be stimulation induced myoclonus or subcortical myoclonus.  Lora Havens  ECHOCARDIOGRAM COMPLETE  Result Date: 05/25/2019   ECHOCARDIOGRAM REPORT   Patient Name:   Xavier Doyle Date of Exam: 05/25/2019 Medical Rec #:  [id number]   Height:       68.0 in Accession #:    [id number]  Weight:       280.2 lb Date of Birth:  November 01, 1960   BSA:          2.36 m Patient Age:    55 years    BP:           129/77 mmHg Patient Gender: M           HR:           43 bpm. Exam Location:  Inpatient Procedure: 2D Echo, Cardiac Doppler and Color Doppler STAT ECHO Indications:    Cardiac arrest I46.9  History:        Patient has prior history of Echocardiogram examinations, most                 recent 05/23/2019. Arrythmias:Cardiac Arrest,                 Signs/Symptoms:Chest Pain; Risk Factors:Hypertension,                 Dyslipidemia and  Former Smoker.  Sonographer:    Paulita Fujita RDCS Referring Phys: [id number] CH[facility] JANE ELLISON IMPRESSIONS  1. Left ventricular ejection fraction, by visual estimation, is 25 to 30%. The left ventricle has severely decreased function. There is mildly increased left ventricular hypertrophy.  2. Left ventricular diastolic parameters are consistent with Grade I diastolic dysfunction (impaired relaxation).  3. The left ventricle demonstrates global hypokinesis.  4. Global right ventricle has moderately reduced systolic function.The right ventricular size is normal. No increase in right ventricular wall thickness.  5. Left atrial size was normal.  6. Right atrial size was normal.  7. The mitral valve is normal in structure. Trivial mitral valve regurgitation. No evidence of mitral stenosis.  8. The tricuspid valve is normal in structure.  9. The tricuspid valve is normal in structure. Tricuspid valve regurgitation is trivial.  10. The aortic valve is tricuspid. Aortic valve regurgitation is trivial. No evidence of aortic valve sclerosis or stenosis. 11. The pulmonic valve was normal in structure. Pulmonic valve regurgitation is not visualized. 12. Moderately elevated pulmonary artery systolic pressure. 13. The tricuspid regurgitant velocity is 2.73 m/s, and with an assumed right atrial pressure of 15 mmHg, the estimated right ventricular systolic pressure is moderately elevated at 44.8 mmHg. 14. The inferior vena cava is dilated in size with <50% respiratory variability, suggesting right atrial pressure of 15 mmHg. 15. No significant change from prior study. FINDINGS  Left Ventricle: Left ventricular ejection fraction, by visual estimation, is 25 to 30%. The left ventricle has severely decreased function. The left ventricle demonstrates global hypokinesis. There is mildly increased left ventricular hypertrophy. Left ventricular diastolic parameters are consistent with Grade I diastolic dysfunction (impaired relaxation).  Normal left atrial pressure. Right Ventricle: The right ventricular size is normal. No increase in right ventricular wall thickness. Global RV systolic function is has moderately reduced systolic function. The tricuspid regurgitant velocity is 2.73 m/s, and with an assumed right atrial pressure of 15 mmHg, the estimated right ventricular systolic pressure is moderately elevated at 44.8 mmHg. Left Atrium: Left atrial size was normal in size. Right Atrium: Right atrial size was normal in size Pericardium: There is no evidence of pericardial effusion. Mitral Valve: The mitral valve is normal in structure. There is mild thickening of the mitral valve leaflet(s). Trivial mitral valve regurgitation. No evidence of mitral valve stenosis by observation. Tricuspid Valve: The tricuspid valve is normal in structure. Tricuspid valve regurgitation is trivial. Aortic Valve: The aortic valve is tricuspid. . There is mild thickening and mild calcification of the aortic valve. Aortic valve regurgitation is trivial. The aortic valve is structurally normal, with no evidence of sclerosis or stenosis. There is mild thickening of the aortic valve. There is mild calcification of the aortic valve. Pulmonic Valve: The pulmonic valve was normal in structure. Pulmonic valve regurgitation is not visualized. Pulmonic regurgitation is not visualized. Aorta: The aortic root, ascending aorta and aortic arch are all structurally normal, with no evidence of dilitation or obstruction. Venous: The inferior vena cava is dilated in size with less than 50% respiratory variability, suggesting right atrial pressure of 15 mmHg. IAS/Shunts: No atrial level shunt detected by color flow Doppler. There is no evidence of a patent foramen ovale. No ventricular septal defect is seen or detected. There is no evidence of an atrial septal defect.  LEFT VENTRICLE PLAX 2D LVIDd:         5.20 cm LVIDs:         4.60 cm LV PW:         1.20 cm LV IVS:        1.20 cm LVOT  diam:     2.10 cm LV SV:         32 ml LV SV Index:   12.71 LVOT Area:     3.46 cm  LV Volumes (MOD) LV area d, A2C:    47.00 cm LV area d, A4C:    59.90 cm LV area s, A2C:    36.30 cm LV area s, A4C:    49.10 cm LV major d, A2C:   9.44 cm LV major d, A4C:   10.20 cm LV major s, A2C:   8.16 cm LV major s, A4C:   9.55 cm LV vol d, MOD A2C: 195.0 ml LV vol d, MOD A4C: 290.0 ml LV vol s, MOD  A2C: 136.0 ml LV vol s, MOD A4C: 212.0 ml LV SV MOD A2C:     59.0 ml LV SV MOD A4C:     290.0 ml LV SV MOD BP:      63.5 ml RIGHT VENTRICLE TAPSE (M-mode): 1.3 cm LEFT ATRIUM             Index       RIGHT ATRIUM           Index LA diam:        3.60 cm 1.53 cm/m  RA Area:     21.00 cm LA Vol (A2C):   56.7 ml 24.04 ml/m RA Volume:   59.50 ml  25.23 ml/m LA Vol (A4C):   47.6 ml 20.18 ml/m LA Biplane Vol: 54.2 ml 22.98 ml/m  AORTIC VALVE LVOT Vmax:   117.00 cm/s LVOT Vmean:  69.800 cm/s LVOT VTI:    0.167 m  AORTA Ao Root diam: 3.20 cm TRICUSPID VALVE TR Peak grad:   29.8 mmHg TR Vmax:        273.00 cm/s  SHUNTS Systemic VTI:  0.17 m Systemic Diam: 2.10 cm  Candee Furbish MD Electronically signed by Candee Furbish MD Signature Date/Time: 05/25/2019/8:58:28 AM    Final    ECHOCARDIOGRAM COMPLETE  Result Date: 05/23/2019   ECHOCARDIOGRAM REPORT   Patient Name:   Xavier Doyle Date of Exam: 05/23/2019 Medical Rec #:  [id number]   Height:       68.0 in Accession #:    [id number]  Weight:       270.8 lb Date of Birth:  May 12, 1960   BSA:          2.32 m Patient Age:    56 years    BP:           153/94 mmHg Patient Gender: M           HR:           77 bpm. Exam Location:  Inpatient Procedure: 2D Echo, Cardiac Doppler and Color Doppler Indications:    Chest pain  History:        Patient has no prior history of Echocardiogram examinations.                 Signs/Symptoms:Chest Pain; Risk Factors:Hypertension and                 Dyslipidemia. Alcohol abuse, Cocaine abuse.  Sonographer:    Xavier Flock Referring Phys: [id number] Darreld Mclean  Sonographer Comments: Patient is morbidly obese. IMPRESSIONS  1. Left ventricular ejection fraction, by visual estimation, is 25 to 30%. The left ventricle has severely decreased function. There is moderately increased left ventricular hypertrophy.  2. Left ventricular diastolic parameters are consistent with Grade I diastolic dysfunction (impaired relaxation).  3. The left ventricle demonstrates global hypokinesis.  4. Global right ventricle has mildly reduced systolic function.The right ventricular size is normal. No increase in right ventricular wall thickness.  5. Left atrial size was mildly dilated.  6. Right atrial size was mildly dilated.  7. The mitral valve is normal in structure. Mild mitral valve regurgitation. No evidence of mitral stenosis.  8. The tricuspid valve is normal in structure. Tricuspid valve regurgitation is not demonstrated.  9. The aortic valve is tricuspid. Aortic valve regurgitation is mild. Mild aortic valve sclerosis without stenosis. 10. The inferior vena cava is normal in size with greater than 50% respiratory variability, suggesting right atrial pressure of 3 mmHg. 11.  TR signal is inadequate for assessing pulmonary artery systolic pressure. FINDINGS  Left Ventricle: Left ventricular ejection fraction, by visual estimation, is 25 to 30%. The left ventricle has severely decreased function. The left ventricle demonstrates global hypokinesis. The left ventricular internal cavity size was the left ventricle is normal in size. There is moderately increased left ventricular hypertrophy. Left ventricular diastolic parameters are consistent with Grade I diastolic dysfunction (impaired relaxation). Right Ventricle: The right ventricular size is normal. No increase in right ventricular wall thickness. Global RV systolic function is has mildly reduced systolic function. Left Atrium: Left atrial size was mildly dilated. Right Atrium: Right atrial size was mildly dilated Pericardium:  There is no evidence of pericardial effusion. Mitral Valve: The mitral valve is normal in structure. Mild mitral valve regurgitation. No evidence of mitral valve stenosis by observation. Tricuspid Valve: The tricuspid valve is normal in structure. Tricuspid valve regurgitation is not demonstrated. Aortic Valve: The aortic valve is tricuspid. Aortic valve regurgitation is mild. Mild aortic valve sclerosis is present, with no evidence of aortic valve stenosis. Pulmonic Valve: The pulmonic valve was normal in structure. Pulmonic valve regurgitation is not visualized. Pulmonic regurgitation is not visualized. Aorta: The aortic root is normal in size and structure. Venous: The inferior vena cava is normal in size with greater than 50% respiratory variability, suggesting right atrial pressure of 3 mmHg. IAS/Shunts: No atrial level shunt detected by color flow Doppler.  LEFT VENTRICLE PLAX 2D LVIDd:         5.52 cm  Diastology LVIDs:         4.56 cm  LV e' lateral:   5.87 cm/s LV PW:         1.43 cm  LV E/e' lateral: 14.5 LV IVS:        1.49 cm  LV e' medial:    5.55 cm/s LVOT diam:     2.10 cm  LV E/e' medial:  15.4 LV SV:         53 ml LV SV Index:   21.44 LVOT Area:     3.46 cm  RIGHT VENTRICLE RV Basal diam:  2.62 cm RV S prime:     9.68 cm/s TAPSE (M-mode): 3.4 cm LEFT ATRIUM           Index       RIGHT ATRIUM           Index LA diam:      3.80 cm 1.63 cm/m  RA Area:     18.90 cm LA Vol (A2C): 33.0 ml 14.20 ml/m RA Volume:   53.00 ml  22.80 ml/m LA Vol (A4C): 83.1 ml 35.75 ml/m  AORTIC VALVE LVOT Vmax:   83.30 cm/s LVOT Vmean:  55.900 cm/s LVOT VTI:    0.160 m  AORTA Ao Root diam: 3.10 cm MITRAL VALVE MV Area (PHT): 4.68 cm             SHUNTS MV PHT:        46.98 msec           Systemic VTI:  0.16 m MV Decel Time: 162 msec             Systemic Diam: 2.10 cm MV E velocity: 85.30 cm/s 103 cm/s MV A velocity: 67.30 cm/s 70.3 cm/s MV E/A ratio:  1.27       1.5  Loralie Champagne MD Electronically signed by Loralie Champagne MD Signature Date/Time: 05/23/2019/3:37:26 PM    Final    VAS  [place] UPPER EXTREMITY VENOUS DUPLEX  Result Date: 06/01/2019 UPPER VENOUS STUDY  Indications: Swelling Limitations: Poor ultrasound/tissue interface, bandages and line. Performing Technologist: Antonieta Pert RDMS, RVT  Examination Guidelines: A complete evaluation includes B-mode imaging, spectral Doppler, color Doppler, and power Doppler as needed of all accessible portions of each vessel. Bilateral testing is considered an integral part of a complete examination. Limited examinations for reoccurring indications may be performed as noted.  Right Findings: +----------+------------+---------+-----------+----------+-------+  RIGHT      Compressible Phasicity Spontaneous Properties Summary  +----------+------------+---------+-----------+----------+-------+  Subclavian     Full        Yes        Yes                         +----------+------------+---------+-----------+----------+-------+  Left Findings: +----------+------------+---------+-----------+----------+-------------------+  LEFT       Compressible Phasicity Spontaneous Properties       Summary        +----------+------------+---------+-----------+----------+-------------------+  IJV            Full        Yes        Yes                                     +----------+------------+---------+-----------+----------+-------------------+  Subclavian     Full        Yes        Yes                                     +----------+------------+---------+-----------+----------+-------------------+  Axillary       Full        Yes        Yes                                     +----------+------------+---------+-----------+----------+-------------------+  Brachial       Full                                                           +----------+------------+---------+-----------+----------+-------------------+  Radial                                                     Not visualized      +----------+------------+---------+-----------+----------+-------------------+  Ulnar                                                      Not visualized     +----------+------------+---------+-----------+----------+-------------------+  Cephalic       Full                                                           +----------+------------+---------+-----------+----------+-------------------+  Basilic      Partial                                     not well visualized  +----------+------------+---------+-----------+----------+-------------------+  Summary:  Right: No evidence of thrombosis in the subclavian.  Left: No evidence of deep vein thrombosis in the upper extremity. No evidence of superficial vein thrombosis in the upper extremity. Left forearm covered by bandages.  *See table(s) above for measurements and observations.  Diagnosing physician: Deitra Mayo MD Electronically signed by Deitra Mayo MD on 06/01/2019 at 7:50:35 AM.    Final      Micro Data:   Results for orders placed or performed during the hospital encounter of 05/21/2019  Respiratory Panel by RT PCR (Flu A&B, Covid) - Nasopharyngeal Swab     Status: None   Collection Time: 05/30/2019 12:32 PM   Specimen: Nasopharyngeal Swab  Result Value Ref Range Status   SARS Coronavirus 2 by RT PCR NEGATIVE NEGATIVE Final    Comment: (NOTE) SARS-CoV-2 target nucleic acids are NOT DETECTED. The SARS-CoV-2 RNA is generally detectable in upper respiratoy specimens during the acute phase of infection. The lowest concentration of SARS-CoV-2 viral copies this assay can detect is 131 copies/mL. A negative result does not preclude SARS-Cov-2 infection and should not be used as the sole basis for treatment or other patient management decisions. A negative result may occur with  improper specimen collection/handling, submission of specimen other than nasopharyngeal swab, presence of viral mutation(s) within the areas targeted by this  assay, and inadequate number of viral copies (<131 copies/mL). A negative result must be combined with clinical observations, patient history, and epidemiological information. The expected result is Negative. Fact Sheet for Patients:  PinkCheek.be Fact Sheet for Healthcare Providers:  GravelBags.it This test is not yet ap proved or cleared by the [place] FDA and  has been authorized for detection and/or diagnosis of SARS-CoV-2 by FDA under an Emergency Use Authorization (EUA). This EUA will remain  in effect (meaning this test can be used) for the duration of the COVID-19 declaration under Section 564(b)(1) of the Act, 21 U.S.C. section 360bbb-3(b)(1), unless the authorization is terminated or revoked sooner.    Influenza A by PCR NEGATIVE NEGATIVE Final   Influenza B by PCR NEGATIVE NEGATIVE Final    Comment: (NOTE) The Xpert Xpress SARS-CoV-2/FLU/RSV assay is intended as an aid in  the diagnosis of influenza from Nasopharyngeal swab specimens and  should not be used as a sole basis for treatment. Nasal washings and  aspirates are unacceptable for Xpert Xpress SARS-CoV-2/FLU/RSV  testing. Fact Sheet for Patients: PinkCheek.be Fact Sheet for Healthcare Providers: GravelBags.it This test is not yet approved or cleared by the [place] FDA and  has been authorized for detection and/or diagnosis of SARS-CoV-2 by  FDA under an Emergency Use Authorization (EUA). This EUA will remain  in effect (meaning this test can be used) for the duration of the  Covid-19 declaration under Section 564(b)(1) of the Act, 21  U.S.C. section 360bbb-3(b)(1), unless the authorization is  terminated or revoked. Performed at [facility], [place] [address] [place], [place] [address]   Culture, blood (routine x 2)     Status: None   Collection Time: 05/26/19  3:20 PM   Specimen:  BLOOD RIGHT ARM  Result Value Ref Range Status   Specimen Description BLOOD RIGHT ARM  Final  Special Requests   Final    BOTTLES DRAWN AEROBIC ONLY Blood Culture results may not be optimal due to an inadequate volume of blood received in culture bottles   Culture   Final    NO GROWTH 5 DAYS Performed at [facility], [place] [address], [place], [place] [address]    Report Status 05/31/2019 FINAL  Final  Culture, blood (routine x 2)     Status: None   Collection Time: 05/26/19  3:27 PM   Specimen: BLOOD RIGHT HAND  Result Value Ref Range Status   Specimen Description BLOOD RIGHT HAND  Final   Special Requests   Final    BOTTLES DRAWN AEROBIC ONLY Blood Culture results may not be optimal due to an inadequate volume of blood received in culture bottles   Culture   Final    NO GROWTH 5 DAYS Performed at [facility], [place] [address], [place], [place] [address]    Report Status 05/31/2019 FINAL  Final  Expectorated sputum assessment w rflx to resp cult     Status: None   Collection Time: 05/27/19 11:54 AM   Specimen: Sputum  Result Value Ref Range Status   Specimen Description SPUTUM  Final   Special Requests NONE  Final   Sputum evaluation   Final    THIS SPECIMEN IS ACCEPTABLE FOR SPUTUM CULTURE Performed at [facility], [place] [address] [place], [place] [address]    Report Status 05/27/2019 FINAL  Final  Culture, respiratory     Status: None   Collection Time: 05/27/19 11:54 AM   Specimen: SPU  Result Value Ref Range Status   Specimen Description SPUTUM  Final   Special Requests NONE Reflexed from [id number]  Final   Gram Stain   Final    MODERATE WBC PRESENT, PREDOMINANTLY PMN RARE GRAM POSITIVE COCCI    Culture   Final    RARE STAPHYLOCOCCUS AUREUS RARE GROUP B STREP(S.AGALACTIAE)ISOLATED TESTING AGAINST S. AGALACTIAE NOT ROUTINELY PERFORMED DUE TO PREDICTABILITY OF AMP/PEN/VAN SUSCEPTIBILITY. Performed at [facility], [place] [address],  [place], [place] [address]    Report Status 05/29/2019 FINAL  Final   Organism ID, Bacteria STAPHYLOCOCCUS AUREUS  Final      Susceptibility   Staphylococcus aureus - MIC*    CIPROFLOXACIN >=8 RESISTANT Resistant     ERYTHROMYCIN >=8 RESISTANT Resistant     GENTAMICIN <=0.5 SENSITIVE Sensitive     OXACILLIN 0.5 SENSITIVE Sensitive     TETRACYCLINE <=1 SENSITIVE Sensitive     VANCOMYCIN 1 SENSITIVE Sensitive     TRIMETH/SULFA <=10 SENSITIVE Sensitive     CLINDAMYCIN <=0.25 SENSITIVE Sensitive     RIFAMPIN <=0.5 SENSITIVE Sensitive     Inducible Clindamycin NEGATIVE Sensitive     * RARE STAPHYLOCOCCUS AUREUS  Culture, blood (routine x 2)     Status: None (Preliminary result)   Collection Time: 06/07/19 10:00 AM   Specimen: BLOOD RIGHT HAND  Result Value Ref Range Status   Specimen Description BLOOD RIGHT HAND  Final   Special Requests   Final    BOTTLES DRAWN AEROBIC ONLY Blood Culture adequate volume   Culture   Final    NO GROWTH < 24 HOURS Performed at [facility], [place] [address], [place], [place] [address]    Report Status PENDING  Incomplete  Culture, blood (routine x 2)     Status: None (Preliminary result)   Collection Time: 06/07/19 10:15 AM   Specimen:  BLOOD LEFT ARM  Result Value Ref Range Status   Specimen Description BLOOD LEFT ARM  Final   Special Requests   Final    BOTTLES DRAWN AEROBIC ONLY Blood Culture adequate volume   Culture   Final    NO GROWTH < 24 HOURS Performed at [facility], [address], [place], [place] [address]    Report Status PENDING  Incomplete     Antimicrobials:   Antibiotics Given (last 72 hours)    Date/Time Action Medication Dose Rate   06/07/19 1137 New Bag/Given   vancomycin (VANCOCIN) 2,500 mg in sodium chloride 0.9 % 500 mL IVPB 2,500 mg 125 mL/hr   06/07/19 1315 New Bag/Given   ceFEPIme (MAXIPIME) 2 g in sodium chloride 0.9 % 100 mL IVPB 2 g 200 mL/hr   06/07/19 2230 New Bag/Given   ceFEPIme (MAXIPIME) 2 g in  sodium chloride 0.9 % 100 mL IVPB 2 g 200 mL/hr   06/08/19 1035 New Bag/Given   ceFEPIme (MAXIPIME) 2 g in sodium chloride 0.9 % 100 mL IVPB 2 g 200 mL/hr   06/08/19 1235 New Bag/Given   vancomycin (VANCOCIN) IVPB 1000 mg/200 mL premix 1,000 mg 200 mL/hr   06/08/19 2226 New Bag/Given   ceFEPIme (MAXIPIME) 2 g in sodium chloride 0.9 % 100 mL IVPB 2 g 200 mL/hr     Interim history/subjective:  Patient remained febrile overnight 102.2 F, remains on vancomycin/cefepime  Objective   Blood pressure 118/76, pulse 94, temperature (!) 101.1 F (38.4 C), temperature source Esophageal, resp. rate (!) 30, height 5\' 8"  (1.727 m), weight 118.4 kg, SpO2 96 %.    Vent Mode: PRVC FiO2 (%):  [50 %-60 %] 50 % Set Rate:  [30 bmp] 30 bmp Vt Set:  [540 mL] 540 mL PEEP:  [5 cmH20-10 cmH20] 8 cmH20 Plateau Pressure:  [19 cmH20-26 cmH20] 26 cmH20   Intake/Output Summary (Last 24 hours) at 06/09/2019 0744 Last data filed at 06/09/2019 0700 Gross per 24 hour  Intake 1183.76 ml  Output 1900 ml  Net -716.24 ml   Filed Weights   06/06/19 0336 06/08/19 0500 06/09/19 0615  Weight: 120.6 kg 118 kg 118.4 kg    Examination: General: Ill-appearing, unresponsive HENT: Normocephalic, atraumatic, ET tube in place Lungs: Rhonchi bilaterally Cardiovascular: Regular rate and rhythm, S1-S2 clear, no murmurs appreciated Abdomen: Soft, normal bowel sounds appreciated Extremities: Trace edema to bilateral lower extremities, pulses within normal limits Neuro: Unresponsive to voice and painful stimulation, left-sided myoclonus appreciated  Resolved Hospital Problem list   Cardiogenic shock secondary to V. 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  Patient successfully weaned off of sedative infusions.  Due to the extensive neurological  injury the patient has a poor prognosis. - Goals of care discussion ongoing with son, expected to visit in person 06/10/2019.  Patient is a modified DNR (all interventions appreciated except chest compressions), will likely transition to full comfort care after son's arrival - Continue below medication regimen -ContinueVersed infusion 10mg /hr,Fentanyl 100-359mcg/hr -ContinueValproic Acid 500mg  BID per tube and Keppra 1000mg  BID - Seizure Precautions in place  -Clonazepam 2mg  BID per tube - Oxycodone 5mg  q6 per tube - Quetiapine 25mg  daily  HFrEF (EF 25-30% G1DD) -Continue amiodarone400mg  BID. Hemodynamically improved -Atorvastatin80mg  daily - could discontinue d/t recent change to plan of care  Fever ON, improving WBC count:  WBC 26.6> 21, fever ON102 F 06/09/2019 -Follow-up blood cultures, no growth to date at 24 hours - Follow-up sputum cultures, most likely not to process due to faulty transportation - Cefepime and Vancomycin per pharmacy - Tylenol PRN fevers  Acute hypoxemic respiratory failure, secondary to pulmonary edema from ADHF [facility] acquired/Aspiration Pneumonia. Diuresed 02/02 Lasix IV 80 x1. Bronchoscopy 02/02 revealed feed-appearing material in the lungs concerning for aspiration. - Stop feeding supplement - Cefepime and vancomycin per pharmacy -Continue pulmonary hygiene, Chest physiotherapy - Torsemide 20 mg p.o. daily  Acute renal failure due to cardiorenal syndrome:serum creatinine acutely worse status post diuresis. Continued stable urine output - Monitor BMP, urine output - Torsemide 20 mg daily p.o.  Hypernatremia, unchanged: 153 06/09/2019. High risk for DI given his brain injury. - Follow up AM BMP  T2DM, chronic, stable: A1c 6.4%. CBG 02/01 87-215. - SSI q4 hours PRN Hyperglycemia  - CBG checks every 4 hours - Levemir 10 units daily  DVT Prophylaxis: Heparin 5,000 units q8  Best practice:  Diet: NPO due to aspirating tube  feeds Pain/Anxiety/Delirium protocol (if indicated): Tylenol, fentanyl, Versed, Klonopin DVT prophylaxis: Heparin GI prophylaxis: PPI Glucose control: Levemir 5 units Mobility: Provided via nursing staff Code Status: Partial DNR (chemical resuscitation and intubation appropriate; no chest compressions) Family Communication: Patient's son arriving afternoon of 2/4 with anticipated end-of-life discussion Friday morning 2/5. Disposition: Plan hospital death  Labs   CBC: Recent Labs  Lab 06/05/19 0522 06/06/19 0332 06/07/19 0246 06/08/19 0219 06/09/19 0210  WBC 21.5* 23.8* 31.7* 26.6* 21.1*  NEUTROABS 16.6* 17.7* 27.0* 22.5* 15.8*  HGB 11.5* 10.4* 10.4* 9.4* 9.1*  HCT 38.3* 35.6* 34.6* 31.1* 30.4*  MCV 76.6* 78.1* 76.9* 77.2* 77.2*  PLT 436* 421* 534* 565* 614*    Basic Metabolic Panel: Recent Labs  Lab 06/03/19 0258 06/03/19 0258 06/05/19 0522 06/05/19 1925 06/06/19 0332 06/07/19 1005 06/09/19 0210  NA 144   < > 153* 153* 153* 149* 153*  K 3.1*   < > 3.3* 3.3* 3.5 4.6 3.9  CL 104   < > 114* 115* 113* 112* 119*  CO2 25   < > 24 24 24  20* 21*  GLUCOSE 166*   < > 173* 147* 121* 137* 134*  BUN 124*   < > 114* 107* 99* 80* 124*  CREATININE 3.22*   < > 2.22* 2.07* 2.06* 2.14* 3.55*  CALCIUM 9.1   < > 10.0 9.7 10.2 9.5 9.3  MG 2.4  --   --   --  2.3  --   --   PHOS 4.1  --   --   --   --   --   --    < > = values in this interval not displayed.   GFR: Estimated Creatinine Clearance: 28.4 mL/min (A) (by C-G formula based on SCr of 3.55 mg/dL (H)). Recent Labs  Lab 06/06/19 0332 06/07/19 0246 06/08/19 0219 06/09/19 0210  WBC 23.8* 31.7* 26.6* 21.1*    Liver Function Tests: No results for input(s): AST, ALT, ALKPHOS, BILITOT, PROT, ALBUMIN in the last 168 hours. No results for input(s): LIPASE, AMYLASE in the last 168 hours. No results for input(s): AMMONIA in the last 168 hours.  ABG    Component Value Date/Time   PHART 7.436 05/29/2019 1050   PCO2ART 26.7 (L)  05/29/2019 1050   PO2ART 94.7 05/29/2019 1050   HCO3 17.9 (L) 05/29/2019 1050   TCO2 21 (L) 05/29/2019 0328   ACIDBASEDEF 5.8 (H) 05/29/2019  1050   O2SAT 97.5 05/29/2019 1050    Coagulation Profile: No results for input(s): INR, PROTIME in the last 168 hours.  Cardiac Enzymes: No results for input(s): CKTOTAL, CKMB, CKMBINDEX, TROPONINI in the last 168 hours.  HbA1C: Hgb A1c MFr Bld  Date/Time Value Ref Range Status  05/23/2019 07:28 AM 6.4 (H) 4.8 - 5.6 % Final    Comment:    (NOTE) Pre diabetes:          5.7%-6.4% Diabetes:              >6.4% Glycemic control for   <7.0% adults with diabetes     CBG: Recent Labs  Lab 06/08/19 1127 06/08/19 1531 06/08/19 1938 06/08/19 2338 06/09/19 0348  GLUCAP 156* 108* 108* 134* 112*    Review of Systems:   Level 5 caveat, unable to obtain  Past Medical History  He,  has a past medical history of Allergy, Anxiety, Arthritis, Asthma, Depression, Heart murmur, Hyperlipidemia, Hypertension, and Osteoporosis.   Surgical History    Past Surgical History:  Procedure Laterality Date   NO PAST SURGERIES     RIGHT/LEFT HEART CATH AND CORONARY ANGIOGRAPHY N/A 05/13/2019   Procedure: RIGHT/LEFT HEART CATH AND CORONARY ANGIOGRAPHY;  Surgeon: Jettie Booze, MD;  Location: [facility];  Service: Cardiovascular;  Laterality: N/A;     Social History   reports that he has quit smoking. He has never used smokeless tobacco. He reports current alcohol use. He reports current drug use. Drug: "Crack" cocaine.   Family History   His family history is negative for Colon cancer, Colon polyps, Esophageal cancer, Rectal cancer, and Stomach cancer.   Allergies No Known Allergies   Home Medications  Prior to Admission medications   Medication Sig Start Date End Date Taking? Authorizing Provider  furosemide (LASIX) 40 MG tablet Take 40 mg by mouth daily as needed for fluid. 04/20/19  Yes [provider]    losartan-hydrochlorothiazide (HYZAAR) 100-25 MG tablet Take 1 tablet by mouth daily.  05/12/18  Yes [provider]  OVER THE COUNTER MEDICATION Neutragenix daily   Yes [provider]  OVER THE COUNTER MEDICATION Take 1 tablet by mouth daily. Keto diet pill    Yes [provider]  PROAIR HFA 108 (90 Base) MCG/ACT inhaler Inhale 2 puffs into the lungs every 6 (six) hours as needed for wheezing.  05/12/18  Yes [provider]  tamsulosin (FLOMAX) 0.4 MG CAPS capsule Take 0.4 mg by mouth.   Yes [provider]    Critical care time: [place], [place], PGY-2 06/09/2019 7:47 AM

## 2019-06-09 NOTE — Progress Notes (Signed)
Pharmacy Antibiotic Note  Xavier Doyle is a 59 y.o. male s/p VF arrest and acute encephalopathy. He was noted with fever and increasing WBC. He is also noted with AKI  Pharmacy has been consulted for vancomycin and cefepime dosing. Family flying in- possible transition to comfort care on 2/5  Cr has continued to trend up but vancomycin random this afternoon is 22 mcg/ml ~26hr after last maintenance dose indicating some degree of clearance. Will adjust vancomycin to q48h regimen starting tomorrow which should allow further clearance prior to dose.   Plan: -Vancomycin 750mg  IV q48h starting tomorrow afternoon -Watch Cr tomorrow, if significantly elevated would recommend rechecking a level prior to redosing    Height: 5\' 8"  (172.7 cm) Weight: 261 lb 0.4 oz (118.4 kg) IBW/kg (Calculated) : 68.4  Temp (24hrs), Avg:101.1 F (38.4 C), Min:100.2 F (37.9 C), Max:102.7 F (39.3 C)  Recent Labs  Lab 06/05/19 0522 06/05/19 1925 06/06/19 0332 06/07/19 0246 06/07/19 1005 06/08/19 0219 06/09/19 0210 06/09/19 1445  WBC 21.5*  --  23.8* 31.7*  --  26.6* 21.1*  --   CREATININE 2.22* 2.07* 2.06*  --  2.14*  --  3.55*  --   VANCORANDOM  --   --   --   --   --   --   --  22    Estimated Creatinine Clearance: 28.4 mL/min (A) (by C-G formula based on SCr of 3.55 mg/dL (H)).    No Known Allergies  Antimicrobials this admission: Vancomycin 1/21 >> 1/23; 2/2>> Cefepime 1/21 >> 1/27; 2/2>>   Microbiology results: 1/22 sputum- staph aureus 2/2 blood x2- ngtd  Thank you for allowing pharmacy to be a part of this patient's care.  Arrie Senate, PharmD, BCPS Clinical Pharmacist [phone] Please check AMION for all [place] numbers 06/09/2019

## 2019-06-10 LAB — BASIC METABOLIC PANEL
Anion gap: 16 — ABNORMAL HIGH (ref 5–15)
Anion gap: 11 (ref 5–15)
BUN: 125 mg/dL — ABNORMAL HIGH (ref 6–20)
BUN: 123 mg/dL — ABNORMAL HIGH (ref 6–20)
CO2: 22 mmol/L (ref 22–32)
CO2: 21 mmol/L — ABNORMAL LOW (ref 22–32)
Calcium: 9.7 mg/dL (ref 8.9–10.3)
Calcium: 9.1 mg/dL (ref 8.9–10.3)
Chloride: 123 mmol/L — ABNORMAL HIGH (ref 98–111)
Chloride: 126 mmol/L — ABNORMAL HIGH (ref 98–111)
Creatinine, Ser: 3.26 mg/dL — ABNORMAL HIGH (ref 0.61–1.24)
Creatinine, Ser: 3.29 mg/dL — ABNORMAL HIGH (ref 0.61–1.24)
GFR calc Af Amer: 23 mL/min — ABNORMAL LOW (ref >60)
GFR calc Af Amer: 23 mL/min — ABNORMAL LOW (ref >60)
GFR calc non Af Amer: 20 mL/min — ABNORMAL LOW (ref >60)
GFR calc non Af Amer: 20 mL/min — ABNORMAL LOW (ref >60)
Glucose, Bld: 151 mg/dL — ABNORMAL HIGH (ref 70–99)
Glucose, Bld: 127 mg/dL — ABNORMAL HIGH (ref 70–99)
Potassium: 3.7 mmol/L (ref 3.5–5.1)
Potassium: 3.9 mmol/L (ref 3.5–5.1)
Sodium: 161 mmol/L (ref 135–145)
Sodium: 158 mmol/L — ABNORMAL HIGH (ref 135–145)

## 2019-06-10 LAB — CBC WITH DIFFERENTIAL/PLATELET
Abs Immature Granulocytes: 0.26 10*3/uL — ABNORMAL HIGH (ref 0.00–0.07)
Basophils Absolute: 0.1 10*3/uL (ref 0.0–0.1)
Basophils Relative: 1 %
Eosinophils Absolute: 0.4 10*3/uL (ref 0.0–0.5)
Eosinophils Relative: 3 %
HCT: 30.6 % — ABNORMAL LOW (ref 39.0–52.0)
Hemoglobin: 9 g/dL — ABNORMAL LOW (ref 13.0–17.0)
Immature Granulocytes: 2 %
Lymphocytes Relative: 15 %
Lymphs Abs: 2.3 10*3/uL (ref 0.7–4.0)
MCH: 22.5 pg — ABNORMAL LOW (ref 26.0–34.0)
MCHC: 29.4 g/dL — ABNORMAL LOW (ref 30.0–36.0)
MCV: 76.5 fL — ABNORMAL LOW (ref 80.0–100.0)
Monocytes Absolute: 1.2 10*3/uL — ABNORMAL HIGH (ref 0.1–1.0)
Monocytes Relative: 8 %
Neutro Abs: 11.5 10*3/uL — ABNORMAL HIGH (ref 1.7–7.7)
Neutrophils Relative %: 71 %
Platelets: 670 10*3/uL — ABNORMAL HIGH (ref 150–400)
RBC: 4 MIL/uL — ABNORMAL LOW (ref 4.22–5.81)
RDW: 17 % — ABNORMAL HIGH (ref 11.5–15.5)
WBC: 15.8 10*3/uL — ABNORMAL HIGH (ref 4.0–10.5)
nRBC: 0 % (ref 0.0–0.2)

## 2019-06-10 LAB — GLUCOSE, CAPILLARY
Glucose-Capillary: 109 mg/dL — ABNORMAL HIGH (ref 70–99)
Glucose-Capillary: 75 mg/dL (ref 70–99)
Glucose-Capillary: 105 mg/dL — ABNORMAL HIGH (ref 70–99)
Glucose-Capillary: 104 mg/dL — ABNORMAL HIGH (ref 70–99)
Glucose-Capillary: 91 mg/dL (ref 70–99)

## 2019-06-10 MED ORDER — SODIUM CHLORIDE 0.45 % IV SOLN: INTRAVENOUS | Status: DC

## 2019-06-10 MED FILL — Sodium Chloride IV Soln 0.9%: INTRAVENOUS | Qty: 250 | Status: AC

## 2019-06-10 MED FILL — Fentanyl Citrate Preservative Free (PF) Inj 2500 MCG/50ML: INTRAMUSCULAR | Qty: 50 | Status: AC

## 2019-06-10 NOTE — Progress Notes (Signed)
Spoke with [facility], Therapist, sports at [facility] regarding patient's critical sodium level of 161. Also discussed the patients seizure activity tonight and relayed the fact that versed boluses have been helping with the seizure activity.   Will continue to monitor.

## 2019-06-10 NOTE — Progress Notes (Signed)
Charted in error.

## 2019-06-10 NOTE — Progress Notes (Addendum)
[facility]PTS continues to socially follow this patient. Family was contacted by palliative 2/1. Family to fly in some time this week Appreciate care provided by CCM in the ICU.  We will be happy to resume care when he is transferred to the floor.   Gerlene Fee, D.O.  PGY-1 Family Medicine  06/10/2019 7:30 AM

## 2019-06-10 NOTE — Progress Notes (Signed)
CPT held at this time per RN due to seizures at this time.

## 2019-06-10 NOTE — Progress Notes (Addendum)
[facility] Progress Note Patient Name: Xavier Doyle DOB: 1961-02-21 MRN: [id number]   Date of Service  06/10/2019  HPI/Events of Note  Hypernatremia - Na+ = 161.  eICU Interventions  Will order: 1. 0.45 NaCl IV infusion to run IV at 100 mL/hour 2. Hold Demedex until patient evaluated by rounding team on 06/10/2019. 3. Repeat BMP at 12 noon.      Intervention Category Major Interventions: Electrolyte abnormality - evaluation and management  Gabrielle Mester [place] 06/10/2019, 4:25 AM

## 2019-06-10 NOTE — Progress Notes (Signed)
NAME:  Xavier Doyle, MRN:  [id number], DOB:  30-Jun-1960, LOS: 50 ADMISSION DATE:  05/17/2019, CONSULTATION DATE: 05/12/2019 REFERRING MD: Family practice teaching service, CHIEF COMPLAINT: Anoxic brain injury status post cardiac arrest  Brief History   59 year old man status post cardiac arrest with poor neurological recovery.  History of present illness   Xavier Doyle is a 59 year old male with history of hypertension, hyperlipidemia, asthma, and depression admitted 05/12/2019 for chest pain.  Patient found to have newly diagnosed systolic heart failure with ejection fraction 25-30% with global hypokinesis.  Patient had cardiac arrest during hospitalization, suffered anoxic brain injury with poor neurological recovery.  Family has agreed to modified DNR status until family can fly in from [place] to be with patient, at which point he will most likely be transitioned to comfort care.  Past Medical History   Patient Active Problem List   Diagnosis Date Noted   Adult failure to thrive    Acute respiratory failure ([place])    Anoxic brain damage ([place])    Palliative care by specialist    DNR (do not resuscitate) discussion    Endotracheally intubated    Pressure injury of skin 05/28/2019   Respiratory failure with hypoxia ([place]) 05/27/2019   Nonischemic cardiomyopathy ([place]) 05/25/2019   Chest pain 05/23/2019   Cardiac arrest [facility]    Ventricular fibrillation ([place])    Prediabetes 05/23/2019   Hyperlipidemia 05/23/2019   Morbid obesity ([place]) 05/23/2019   Essential hypertension 05/23/2019   Acute HFrEF (heart failure with reduced ejection fraction) ([place])    Chest pain with high risk for cardiac etiology 05/16/2019   Significant Hospital Events   1/17 admitted to [facility] for chest pain 1/19 V. 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  1/28 titrated off of dobutamine 2/01 made partial DNR (can have medicinal resuscitation, intubated; no chest compressions) 2/02 bronchoscopy to obtain sputum culture, assess new CXR opacity  Consults:  Palliative care, case management, critical care  Procedures:  1/19: EEG initiated, stopped 1/21 1/20: TTE 1/24: EEG due to myoclonic activity 2/02: bronchoscopy  Significant Diagnostic Tests:  DG Chest 1 View  Result Date: 05/07/2019 CLINICAL DATA:  Central line placement OG tube placement EXAM: CHEST  1 VIEW COMPARISON:  05/22/2019 FINDINGS: Endotracheal tube tip is about 2.7 cm superior to the carina. Esophageal tube tip below the diaphragm but incompletely visualized. Left-sided central venous catheter tip partially obscured by support device, appears to be over the upper SVC. No pneumothorax. Low lung volumes. Enlarged cardiomediastinal silhouette. Increasing airspace disease at the left base. Right lung grossly clear. IMPRESSION: 1. Endotracheal tube tip about 2.7 cm superior to carina. Esophageal tube tip below the diaphragm but incompletely visualized 2. Left IJ central venous catheter tip partially obscured, appears to overlie the SVC origin. No left pneumothorax 3. Increasing airspace disease at the left base.  Cardiomegaly. Electronically Signed   By: Donavan Foil M.D.   On: 05/30/2019 19:16   DG Chest 2 View  Result Date: 06/02/2019 CLINICAL DATA:  Chest pain, shortness of breath. EXAM: CHEST - 2 VIEW COMPARISON:  None. FINDINGS: Mild cardiomegaly is noted. No  pneumothorax or significant pleural effusion is noted. Lungs are clear. Bony thorax is unremarkable. IMPRESSION: No active cardiopulmonary disease. Electronically  Signed   By: Marijo Conception M.D.   On: 06/03/2019 11:33   DG Abd 1 View  Result Date: 05/14/2019 CLINICAL DATA:  OG tube placement EXAM: ABDOMEN - 1 VIEW COMPARISON:  None. FINDINGS: Airspace disease at the left base. Esophageal tube tip overlies the distal stomach. IMPRESSION: Esophageal tube tip overlies the distal stomach. Electronically Signed   By: Donavan Foil M.D.   On: 05/14/2019 19:17   CARDIAC CATHETERIZATION  Result Date: 05/23/2019  Mid RCA lesion is 10% stenosed. No significant CAD.  LV end diastolic pressure is moderately elevated. LVEDP 26 mm Hg.  There is no aortic valve stenosis.  Hemodynamic findings consistent with mild pulmonary hypertension.  Ao sat 96%, PA sat 67%, mean PA pressure 29 mm Hg; mean PCWP 20; CO 6.3 L/min, CI 2.72  Medical therapy for nonischemic cardiomyopathy.   DG CHEST PORT 1 VIEW  Result Date: 06/08/2019 CLINICAL DATA:  Coronary artery disease, hypertension, asthma, previous tobacco abuse EXAM: PORTABLE CHEST 1 VIEW COMPARISON:  06/07/2019 FINDINGS: Single frontal view of the chest demonstrates stable endotracheal tube and enteric catheter. Improved aeration within the left lung, with persistent consolidation at the left lung base obscuring the left hemidiaphragm. Left pleural effusion not excluded. Continued central vascular congestion bilaterally, left greater than right. Otherwise the right chest is clear. No pneumothorax. IMPRESSION: 1. Improving aeration within the left lung, with persistent consolidation and/or effusion at the left lung base. 2. Stable central vascular congestion. 3. Stable support devices. Electronically Signed   By: Randa Ngo M.D.   On: 06/08/2019 11:57   DG CHEST PORT 1 VIEW  Result Date: 06/07/2019 CLINICAL DATA:  LEFT lung consolidation EXAM: PORTABLE CHEST 1 VIEW COMPARISON:  Radiograph 06/07/2019 at 957 hours FINDINGS: Endotracheal tube and feeding tube unchanged. Near complete opacification of the LEFT hemithorax. There  is some improved aeration in LEFT upper . RIGHT lung relatively clear with mild interstitial edema pattern. IMPRESSION: 1. Some mild improvement in aeration to the LEFT upper lobe. The majority of the LEFT hemithorax remains opacified. 2. Stable support apparatus. 3. Mild interstitial edema pattern in the well aerated RIGHT lung. Electronically Signed   By: Suzy Bouchard M.D.   On: 06/07/2019 16:03   DG CHEST PORT 1 VIEW  Result Date: 06/07/2019 CLINICAL DATA:  Fevers. EXAM: PORTABLE CHEST 1 VIEW COMPARISON:  06/06/2019 FINDINGS: ET tube tip is above the carina. There is a feeding tube with tip below the GE junction. Interval complete opacification of the left lung is identified compared with 06/06/2019. Findings are favored to represent complete atelectasis of the left lung which may reflect underlying mucous plugging. Increase interstitial markings within the right base also noted. IMPRESSION: Complete opacification of the left lung favored to represent complete atelectasis of the left lung which may reflect underlying mucous plugging. Follow-up imaging advised to ensure resolution. Increased interstitial markings noted within the right base. These results will be called to the ordering clinician or representative by the Radiologist Assistant, and communication documented in the PACS or zVision Dashboard. Electronically Signed   By: Kerby Moors M.D.   On: 06/07/2019 10:10   DG CHEST PORT 1 VIEW  Result Date: 06/06/2019 CLINICAL DATA:  Status post cardiac arrest. EXAM: PORTABLE CHEST 1 VIEW COMPARISON:  06/05/2019 FINDINGS: The endotracheal tube is 3.8 cm above the carina. The feeding tube is coursing down the  esophagus and into the stomach. Improved lung aeration with resolving right lower lobe atelectasis. No pneumothorax. IMPRESSION: Stable support apparatus. Improved lung aeration with resolving right lower lobe atelectasis. Electronically Signed   By: Marijo Sanes M.D.   On: 06/06/2019 06:46    DG Chest Port 1 View  Result Date: 06/05/2019 CLINICAL DATA:  Ventilator support. EXAM: PORTABLE CHEST 1 VIEW COMPARISON:  05/31/2019 FINDINGS: Endotracheal tube tip is 5 cm above the carina. Soft feeding tube enters the abdomen. Left internal jugular central line tip at the innominate SVC junction. Improved aeration in the left lower lobe. Worsened atelectasis in the right lower and middle lobes. Upper lungs remain clear. IMPRESSION: Lines and tubes satisfactory. Improved aeration in the left lower lobe. Worsened volume loss in the right middle lobe and lower lobe. Electronically Signed   By: Nelson Chimes M.D.   On: 06/05/2019 06:43   DG Chest Port 1 View  Result Date: 05/31/2019 CLINICAL DATA:  Acute respiratory failure EXAM: PORTABLE CHEST 1 VIEW COMPARISON:  Radiograph 05/30/2019 FINDINGS: *Endotracheal tube position in the mid trachea, 4 cm from the carina. *Transesophageal feeding tube tip terminates beyond the GE junction, below the level of imaging. *Left IJ central venous catheter tip terminates at the brachiocephalic-caval confluence. *Telemetry leads overlie the chest. There are patchy bilateral airspace opacities more focal confluent opacity is seen in the left lung base with obscuration of left hemidiaphragm which could reflect a combination of pleural fluid and parenchymal disease. No pneumothorax. Visualized cardiomediastinal contours are similar to prior accounting for differences in technique. No acute osseous or soft tissue abnormality. IMPRESSION: 1. Patchy bilateral airspace opacities are similar to prior. 2. More confluent opacity in the left lung base is slightly decreased from prior. Could reflect a combination of pleural fluid and parenchymal disease. 3. Lines and tubes as above Electronically Signed   By: Lovena Le M.D.   On: 05/31/2019 03:34   DG CHEST PORT 1 VIEW  Result Date: 05/30/2019 CLINICAL DATA:  Ventilator support.  Follow-up. EXAM: PORTABLE CHEST 1 VIEW  COMPARISON:  05/28/2019 FINDINGS: Endotracheal tube tip is 4 cm above the carina. Soft feeding tube enters the abdomen. Left internal jugular central line tip is in the SVC at the azygos level. Right lung shows improvement with better aeration in the lower lobe. Worsened opacity in the left hemithorax with less aerated upper lobe. Findings could be consistent with consolidation and pleural fluid. IMPRESSION: Worsened appearance on the left with less aerated lung. Consolidation and possible pleural fluid. Improved aeration of the right lung base. Electronically Signed   By: Nelson Chimes M.D.   On: 05/30/2019 09:20   DG CHEST PORT 1 VIEW  Result Date: 05/28/2019 CLINICAL DATA:  Dyspnea EXAM: PORTABLE CHEST 1 VIEW COMPARISON:  05/27/2019 FINDINGS: Moderate interstitial/airspace opacities in the lungs bilaterally. Small to moderate left pleural effusion. No pneumothorax. Cardiomegaly. Endotracheal tube terminates 3.5 cm above the carina. Left IJ venous catheter terminates in the mid SVC. Enteric tube courses into the stomach. IMPRESSION: Moderate interstitial/airspace opacities in the lungs bilaterally, favoring interstitial edema over multifocal pneumonia, unchanged. Small to moderate left pleural effusion. Endotracheal tube terminates 3.5 cm above the carina. Additional support apparatus as above. Electronically Signed   By: Julian Hy M.D.   On: 05/28/2019 10:52   DG CHEST PORT 1 VIEW  Result Date: 05/27/2019 CLINICAL DATA:  Ventricular fibrillation, CHF.  Endotracheal tube. EXAM: PORTABLE CHEST 1 VIEW COMPARISON:  05/26/2019. FINDINGS: Endotracheal tube, NG tube, left IJ line  in stable position. Cardiomegaly with progressive bilateral pulmonary infiltrates/edema. Small bilateral pleural effusions. Findings consistent with progressive CHF. Bilateral pneumonia cannot be excluded. No pneumothorax. IMPRESSION: 1.  Lines and tubes in stable position. 2. Cardiomegaly with progressive bilateral pulmonary  infiltrates/edema. Small bilateral pleural effusions. Findings consistent progressive CHF. Bilateral pneumonia cannot be excluded. Electronically Signed   By: Marcello Moores  Register   On: 05/27/2019 05:50   DG CHEST PORT 1 VIEW  Result Date: 05/26/2019 CLINICAL DATA:  Dyspnea. Evaluate for possible aspiration. Intubated patient. EXAM: PORTABLE CHEST 1 VIEW COMPARISON:  05/12/2019 FINDINGS: There is opacity at both lung bases, likely a combination of atelectasis and pleural fluid. This has increased from the prior study. There is also bilateral perihilar opacity increased from the prior exam. These findings are accentuated by low lung volumes. Endotracheal tube tip projects 2 point 8 cm above the carina. Left internal jugular central venous catheter has its tip at the confluence of the brachiocephalic vein and superior vena cava. Nasal/orogastric tube passes below the diaphragm well into the stomach. IMPRESSION: 1. Worsened lung aeration compared to the prior study. There are perihilar bilateral lung base opacities. Suspect combination of pulmonary edema with lung base atelectasis and pleural effusions. Pneumonia or aspiration pneumonitis is not excluded. 2. Stable support apparatus. Electronically Signed   By: Lajean Manes M.D.   On: 05/26/2019 15:49   DG Chest Port 1 View  Result Date: 05/17/2019 CLINICAL DATA:  Status post intubation. EXAM: PORTABLE CHEST 1 VIEW COMPARISON:  May 22, 2019 FINDINGS: An endotracheal tube is seen with its distal tip approximately 4.7 cm from the carina. This represents a new finding when compared to the prior study. Mildly radiopaque tags and labels are seen overlying the left lung with subsequently limited evaluation of the pulmonary parenchyma within these regions. There is no evidence of acute infiltrate, pleural effusion or pneumothorax. There is mild to moderate severity enlargement of the cardiac silhouette. The visualized skeletal structures are unremarkable. IMPRESSION:  1. Interval endotracheal tube placement and positioning, as described above, when compared to the prior chest plain film dated May 22, 2019. Electronically Signed   By: Virgina Norfolk M.D.   On: 05/21/2019 17:43   DG Abd Portable 1V  Result Date: 06/07/2019 CLINICAL DATA:  OG tube placement. EXAM: PORTABLE ABDOMEN - 1 VIEW COMPARISON:  05/27/2019 FINDINGS: Feeding tube tip appears to be within the proximal jejunum beyond the ligament of Treitz in the same position as on the prior study. The visualized bowel gas pattern is normal. Dense consolidation in the left mid and lower lung zones. IMPRESSION: Feeding tube tip appears to be in the proximal jejunum. Electronically Signed   By: Lorriane Shire M.D.   On: 06/07/2019 12:02   DG Abd Portable 1V  Result Date: 05/27/2019 CLINICAL DATA:  Feeding tube placement EXAM: PORTABLE ABDOMEN - 1 VIEW COMPARISON:  Abdominal radiograph obtained earlier in the day FINDINGS: Nasogastric tube has been removed. Feeding tube now present with tip in proximal jejunum. There is paucity of bowel gas. No bowel dilatation or free air evident. There is consolidation in the left lower lobe as well as more patchy infiltrate in the right base. IMPRESSION: 1.  Feeding tube tip in proximal jejunum. 2. Paucity of bowel gas. This finding raises concern for a degree of ileus or enteritis. Bowel obstruction less likely. No free air. 3. Consolidation throughout the left lower lobe with more patchy infiltrate left base. Small left pleural effusion cannot be excluded. Electronically Signed  By: Lowella Grip III M.D.   On: 05/27/2019 13:49   DG Abd Portable 1V  Result Date: 05/27/2019 CLINICAL DATA:  Impaired nasogastric feeding tube. EXAM: PORTABLE ABDOMEN - 1 VIEW COMPARISON:  One-view abdomen 05/20/2019. One-view chest x-ray 05/27/2019. FINDINGS: Side port of the NG tube is in the stomach. The stomach is decompressed. Left greater than right basilar airspace disease is noted.  The heart is enlarged. Bowel gas pattern is unremarkable. IMPRESSION: 1. Side port of the NG tube is in the stomach. 2. Left greater than right basilar airspace disease. Electronically Signed   By: San Morelle M.D.   On: 05/27/2019 05:14   EEG adult  Result Date: 05/11/2019 Lora Havens, MD     05/19/2019  9:05 PM Patient Name: Xavier Doyle MRN: [id number] Epilepsy Attending: Lora Havens Referring Physician/Provider: Merlene Laughter, NP Date: 05/06/2019 Duration: 21.03 mins Patient history: 59yo s/p cardiac arrest now on TTM. EEG to evaluate for seizure Level of alertness: comatose AEDs during EEG study: Propofol Technical aspects: This EEG study was done with scalp electrodes positioned according to the 10-20 International system of electrode placement. Electrical activity was acquired at a sampling rate of 500Hz  and reviewed with a high frequency filter of 70Hz  and a low frequency filter of 1Hz . EEG data were recorded continuously and digitally stored. DESCRIPTION: EEG showed continuous generalized background suppression. Intermittent generalized high amplitude sharply contoured 4-6zh theta slowing was also noted. Hyperventilation and photic stimulation were not performed. ABNORMALITY - Background suppression, generalized - Intermittent slow, generalized IMPRESSION: This study is showed evidence of profound diffuse encephalopathy, non specific to etiology. No seizures or definite epileptiform discharges were seen throughout the recording. Priyanka [name]   Overnight EEG with video  Result Date: 05/25/2019 Lora Havens, MD     05/26/2019  9:09 AM Patient Name: Xavier Doyle MRN: [id number] Epilepsy Attending: Lora Havens Referring Physician/Provider: Merlene Laughter, NP Duration: 05/18/2019 2048 to 05/25/2019 2048  Patient history: 59yo s/p cardiac arrest now on TTM. EEG to evaluate for seizure  Level of alertness: comatose  AEDs during EEG study: Propofol, Keppra  Technical aspects:  This EEG study was done with scalp electrodes positioned according to the 10-20 International system of electrode placement. Electrical activity was acquired at a sampling rate of 500Hz  and reviewed with a high frequency filter of 70Hz  and a low frequency filter of 1Hz . EEG data were recorded continuously and digitally stored.  DESCRIPTION: EEG showed continuous generalized background suppression. Intermittent generalized high amplitude sharply contoured 4-6zh theta slowing was also noted. Hyperventilation and photic stimulation were not performed. Event button was pressed multiple times (at 2340 and 2349 on 05/31/2019, at 1358, 1402, 1403, 1405, 1406, 1416, 1426, 1445, 1451, 1518, 1530, 1536, 1552, 1609, 1948 on 05/25/2019) for unclear reasons.  Concomitant EEG at times showed high amplitude sharply contoured 4 to 6 Hz theta slowing without clear evolution.  ABNORMALITY - Burst suppression, generalized  IMPRESSION: This study is showed evidence of profound diffuse encephalopathy, non specific to etiology. No seizures or definite epileptiform discharges were seen throughout the recording.  Priyanka [name]   EEG LTVM - Continuous Bedside W/ Video Includes Portable EEG Read  Result Date: 05/30/2019 Lora Havens, MD     05/30/2019  1:52 PM Patient Name:Xavier Doyle [id number] Epilepsy Attending:Priyanka [name] Referring Physician/Provider:Dr. Dyann Ruddle Duration:05/29/2019 1529 to 05/30/2019 1036  Patient history:58yo s/p cardiac arrest now on TTM. EEG to evaluate for seizure  Level of alertness:comatose  AEDs  during EEG study:Keppra, valproate, Versed, clonazepam  Technical aspects: This EEG study was done with scalp electrodes positioned according to the 10-20 International system of electrode placement. Electrical activity was acquired at a sampling rate of 500Hz  and reviewed with a high frequency filter of 70Hz  and a low frequency filter of 1Hz . EEG data were recorded continuously and  digitally stored.  DESCRIPTION: EEG showed continuous generalized background suppression. EEG was reactive to tactile stimulation. Hyperventilation and photic stimulation were not performed.  EKG artifact was seen throughout the study. Event button was pressed multiple times during the study (On 05/29/2019 1701 and 2026, on 05/30/2019 at 739, 0800 and 0854). On video, patient does appear to have whole-body twitching or right or left side twitching. Concomitant EEG before during and after the event and does not show any EEG change to suggest seizure.  ABNORMALITY -Backgroundsuppression, generalized   IMPRESSION: This study isshowed evidence of profound diffuse encephalopathy, non specific to etiology. No seizures ordefiniteepileptiform discharges were seen throughout the recording. Event button was pressed multiple times as described above for whole body twitching and left or right leg twitching without concomitant EEG change and is therefore not epileptic. Of note, it appears that these episodes may be triggered by stimulation and given the semiology of the events could be stimulation induced myoclonus or subcortical myoclonus.  Lora Havens  ECHOCARDIOGRAM COMPLETE  Result Date: 05/25/2019   ECHOCARDIOGRAM REPORT   Patient Name:   Xavier Doyle Date of Exam: 05/25/2019 Medical Rec #:  [id number]   Height:       68.0 in Accession #:    [id number]  Weight:       280.2 lb Date of Birth:  December 21, 1960   BSA:          2.36 m Patient Age:    36 years    BP:           129/77 mmHg Patient Gender: M           HR:           43 bpm. Exam Location:  Inpatient Procedure: 2D Echo, Cardiac Doppler and Color Doppler STAT ECHO Indications:    Cardiac arrest I46.9  History:        Patient has prior history of Echocardiogram examinations, most                 recent 05/23/2019. Arrythmias:Cardiac Arrest,                 Signs/Symptoms:Chest Pain; Risk Factors:Hypertension,                 Dyslipidemia and Former Smoker.   Sonographer:    Paulita Fujita RDCS Referring Phys: [id number] CHI JANE ELLISON IMPRESSIONS  1. Left ventricular ejection fraction, by visual estimation, is 25 to 30%. The left ventricle has severely decreased function. There is mildly increased left ventricular hypertrophy.  2. Left ventricular diastolic parameters are consistent with Grade I diastolic dysfunction (impaired relaxation).  3. The left ventricle demonstrates global hypokinesis.  4. Global right ventricle has moderately reduced systolic function.The right ventricular size is normal. No increase in right ventricular wall thickness.  5. Left atrial size was normal.  6. Right atrial size was normal.  7. The mitral valve is normal in structure. Trivial mitral valve regurgitation. No evidence of mitral stenosis.  8. The tricuspid valve is normal in structure.  9. The tricuspid valve is normal in structure. Tricuspid valve regurgitation is trivial. 10. The aortic valve  is tricuspid. Aortic valve regurgitation is trivial. No evidence of aortic valve sclerosis or stenosis. 11. The pulmonic valve was normal in structure. Pulmonic valve regurgitation is not visualized. 12. Moderately elevated pulmonary artery systolic pressure. 13. The tricuspid regurgitant velocity is 2.73 m/s, and with an assumed right atrial pressure of 15 mmHg, the estimated right ventricular systolic pressure is moderately elevated at 44.8 mmHg. 14. The inferior vena cava is dilated in size with <50% respiratory variability, suggesting right atrial pressure of 15 mmHg. 15. No significant change from prior study. FINDINGS  Left Ventricle: Left ventricular ejection fraction, by visual estimation, is 25 to 30%. The left ventricle has severely decreased function. The left ventricle demonstrates global hypokinesis. There is mildly increased left ventricular hypertrophy. Left ventricular diastolic parameters are consistent with Grade I diastolic dysfunction (impaired relaxation). Normal left atrial  pressure. Right Ventricle: The right ventricular size is normal. No increase in right ventricular wall thickness. Global RV systolic function is has moderately reduced systolic function. The tricuspid regurgitant velocity is 2.73 m/s, and with an assumed right atrial pressure of 15 mmHg, the estimated right ventricular systolic pressure is moderately elevated at 44.8 mmHg. Left Atrium: Left atrial size was normal in size. Right Atrium: Right atrial size was normal in size Pericardium: There is no evidence of pericardial effusion. Mitral Valve: The mitral valve is normal in structure. There is mild thickening of the mitral valve leaflet(s). Trivial mitral valve regurgitation. No evidence of mitral valve stenosis by observation. Tricuspid Valve: The tricuspid valve is normal in structure. Tricuspid valve regurgitation is trivial. Aortic Valve: The aortic valve is tricuspid. . There is mild thickening and mild calcification of the aortic valve. Aortic valve regurgitation is trivial. The aortic valve is structurally normal, with no evidence of sclerosis or stenosis. There is mild thickening of the aortic valve. There is mild calcification of the aortic valve. Pulmonic Valve: The pulmonic valve was normal in structure. Pulmonic valve regurgitation is not visualized. Pulmonic regurgitation is not visualized. Aorta: The aortic root, ascending aorta and aortic arch are all structurally normal, with no evidence of dilitation or obstruction. Venous: The inferior vena cava is dilated in size with less than 50% respiratory variability, suggesting right atrial pressure of 15 mmHg. IAS/Shunts: No atrial level shunt detected by color flow Doppler. There is no evidence of a patent foramen ovale. No ventricular septal defect is seen or detected. There is no evidence of an atrial septal defect.  LEFT VENTRICLE PLAX 2D LVIDd:         5.20 cm LVIDs:         4.60 cm LV PW:         1.20 cm LV IVS:        1.20 cm LVOT diam:     2.10 cm LV  SV:         32 ml LV SV Index:   12.71 LVOT Area:     3.46 cm  LV Volumes (MOD) LV area d, A2C:    47.00 cm LV area d, A4C:    59.90 cm LV area s, A2C:    36.30 cm LV area s, A4C:    49.10 cm LV major d, A2C:   9.44 cm LV major d, A4C:   10.20 cm LV major s, A2C:   8.16 cm LV major s, A4C:   9.55 cm LV vol d, MOD A2C: 195.0 ml LV vol d, MOD A4C: 290.0 ml LV vol s, MOD A2C: 136.0 ml LV  vol s, MOD A4C: 212.0 ml LV SV MOD A2C:     59.0 ml LV SV MOD A4C:     290.0 ml LV SV MOD BP:      63.5 ml RIGHT VENTRICLE TAPSE (M-mode): 1.3 cm LEFT ATRIUM             Index       RIGHT ATRIUM           Index LA diam:        3.60 cm 1.53 cm/m  RA Area:     21.00 cm LA Vol (A2C):   56.7 ml 24.04 ml/m RA Volume:   59.50 ml  25.23 ml/m LA Vol (A4C):   47.6 ml 20.18 ml/m LA Biplane Vol: 54.2 ml 22.98 ml/m  AORTIC VALVE LVOT Vmax:   117.00 cm/s LVOT Vmean:  69.800 cm/s LVOT VTI:    0.167 m  AORTA Ao Root diam: 3.20 cm TRICUSPID VALVE TR Peak grad:   29.8 mmHg TR Vmax:        273.00 cm/s  SHUNTS Systemic VTI:  0.17 m Systemic Diam: 2.10 cm  Candee Furbish MD Electronically signed by Candee Furbish MD Signature Date/Time: 05/25/2019/8:58:28 AM    Final    ECHOCARDIOGRAM COMPLETE  Result Date: 05/23/2019   ECHOCARDIOGRAM REPORT   Patient Name:   Xavier Doyle Date of Exam: 05/23/2019 Medical Rec #:  [id number]   Height:       68.0 in Accession #:    [id number]  Weight:       270.8 lb Date of Birth:  03/24/1961   BSA:          2.32 m Patient Age:    55 years    BP:           153/94 mmHg Patient Gender: M           HR:           77 bpm. Exam Location:  Inpatient Procedure: 2D Echo, Cardiac Doppler and Color Doppler Indications:    Chest pain  History:        Patient has no prior history of Echocardiogram examinations.                 Signs/Symptoms:Chest Pain; Risk Factors:Hypertension and                 Dyslipidemia. Alcohol abuse, Cocaine abuse.  Sonographer:    Dustin Flock Referring Phys: [id number] Darreld Mclean  Sonographer  Comments: Patient is morbidly obese. IMPRESSIONS  1. Left ventricular ejection fraction, by visual estimation, is 25 to 30%. The left ventricle has severely decreased function. There is moderately increased left ventricular hypertrophy.  2. Left ventricular diastolic parameters are consistent with Grade I diastolic dysfunction (impaired relaxation).  3. The left ventricle demonstrates global hypokinesis.  4. Global right ventricle has mildly reduced systolic function.The right ventricular size is normal. No increase in right ventricular wall thickness.  5. Left atrial size was mildly dilated.  6. Right atrial size was mildly dilated.  7. The mitral valve is normal in structure. Mild mitral valve regurgitation. No evidence of mitral stenosis.  8. The tricuspid valve is normal in structure. Tricuspid valve regurgitation is not demonstrated.  9. The aortic valve is tricuspid. Aortic valve regurgitation is mild. Mild aortic valve sclerosis without stenosis. 10. The inferior vena cava is normal in size with greater than 50% respiratory variability, suggesting right atrial pressure of 3 mmHg. 11. TR signal is inadequate  for assessing pulmonary artery systolic pressure. FINDINGS  Left Ventricle: Left ventricular ejection fraction, by visual estimation, is 25 to 30%. The left ventricle has severely decreased function. The left ventricle demonstrates global hypokinesis. The left ventricular internal cavity size was the left ventricle is normal in size. There is moderately increased left ventricular hypertrophy. Left ventricular diastolic parameters are consistent with Grade I diastolic dysfunction (impaired relaxation). Right Ventricle: The right ventricular size is normal. No increase in right ventricular wall thickness. Global RV systolic function is has mildly reduced systolic function. Left Atrium: Left atrial size was mildly dilated. Right Atrium: Right atrial size was mildly dilated Pericardium: There is no evidence of  pericardial effusion. Mitral Valve: The mitral valve is normal in structure. Mild mitral valve regurgitation. No evidence of mitral valve stenosis by observation. Tricuspid Valve: The tricuspid valve is normal in structure. Tricuspid valve regurgitation is not demonstrated. Aortic Valve: The aortic valve is tricuspid. Aortic valve regurgitation is mild. Mild aortic valve sclerosis is present, with no evidence of aortic valve stenosis. Pulmonic Valve: The pulmonic valve was normal in structure. Pulmonic valve regurgitation is not visualized. Pulmonic regurgitation is not visualized. Aorta: The aortic root is normal in size and structure. Venous: The inferior vena cava is normal in size with greater than 50% respiratory variability, suggesting right atrial pressure of 3 mmHg. IAS/Shunts: No atrial level shunt detected by color flow Doppler.  LEFT VENTRICLE PLAX 2D LVIDd:         5.52 cm  Diastology LVIDs:         4.56 cm  LV e' lateral:   5.87 cm/s LV PW:         1.43 cm  LV E/e' lateral: 14.5 LV IVS:        1.49 cm  LV e' medial:    5.55 cm/s LVOT diam:     2.10 cm  LV E/e' medial:  15.4 LV SV:         53 ml LV SV Index:   21.44 LVOT Area:     3.46 cm  RIGHT VENTRICLE RV Basal diam:  2.62 cm RV S prime:     9.68 cm/s TAPSE (M-mode): 3.4 cm LEFT ATRIUM           Index       RIGHT ATRIUM           Index LA diam:      3.80 cm 1.63 cm/m  RA Area:     18.90 cm LA Vol (A2C): 33.0 ml 14.20 ml/m RA Volume:   53.00 ml  22.80 ml/m LA Vol (A4C): 83.1 ml 35.75 ml/m  AORTIC VALVE LVOT Vmax:   83.30 cm/s LVOT Vmean:  55.900 cm/s LVOT VTI:    0.160 m  AORTA Ao Root diam: 3.10 cm MITRAL VALVE MV Area (PHT): 4.68 cm             SHUNTS MV PHT:        46.98 msec           Systemic VTI:  0.16 m MV Decel Time: 162 msec             Systemic Diam: 2.10 cm MV E velocity: 85.30 cm/s 103 cm/s MV A velocity: 67.30 cm/s 70.3 cm/s MV E/A ratio:  1.27       1.5  Loralie Champagne MD Electronically signed by Loralie Champagne MD Signature  Date/Time: 05/23/2019/3:37:26 PM    Final    VAS [place] UPPER EXTREMITY VENOUS  DUPLEX  Result Date: 06/01/2019 UPPER VENOUS STUDY  Indications: Swelling Limitations: Poor ultrasound/tissue interface, bandages and line. Performing Technologist: Antonieta Pert RDMS, RVT  Examination Guidelines: A complete evaluation includes B-mode imaging, spectral Doppler, color Doppler, and power Doppler as needed of all accessible portions of each vessel. Bilateral testing is considered an integral part of a complete examination. Limited examinations for reoccurring indications may be performed as noted.  Right Findings: +----------+------------+---------+-----------+----------+-------+  RIGHT      Compressible Phasicity Spontaneous Properties Summary  +----------+------------+---------+-----------+----------+-------+  Subclavian     Full        Yes        Yes                         +----------+------------+---------+-----------+----------+-------+  Left Findings: +----------+------------+---------+-----------+----------+-------------------+  LEFT       Compressible Phasicity Spontaneous Properties       Summary        +----------+------------+---------+-----------+----------+-------------------+  IJV            Full        Yes        Yes                                     +----------+------------+---------+-----------+----------+-------------------+  Subclavian     Full        Yes        Yes                                     +----------+------------+---------+-----------+----------+-------------------+  Axillary       Full        Yes        Yes                                     +----------+------------+---------+-----------+----------+-------------------+  Brachial       Full                                                           +----------+------------+---------+-----------+----------+-------------------+  Radial                                                     Not visualized      +----------+------------+---------+-----------+----------+-------------------+  Ulnar                                                      Not visualized     +----------+------------+---------+-----------+----------+-------------------+  Cephalic       Full                                                           +----------+------------+---------+-----------+----------+-------------------+  Basilic      Partial                                     not well visualized  +----------+------------+---------+-----------+----------+-------------------+  Summary:  Right: No evidence of thrombosis in the subclavian.  Left: No evidence of deep vein thrombosis in the upper extremity. No evidence of superficial vein thrombosis in the upper extremity. Left forearm covered by bandages.  *See table(s) above for measurements and observations.  Diagnosing physician: Deitra Mayo MD Electronically signed by Deitra Mayo MD on 06/01/2019 at 7:50:35 AM.    Final      Micro Data:   Results for orders placed or performed during the hospital encounter of 05/11/2019  Respiratory Panel by RT PCR (Flu A&B, Covid) - Nasopharyngeal Swab     Status: None   Collection Time: 06/03/2019 12:32 PM   Specimen: Nasopharyngeal Swab  Result Value Ref Range Status   SARS Coronavirus 2 by RT PCR NEGATIVE NEGATIVE Final    Comment: (NOTE) SARS-CoV-2 target nucleic acids are NOT DETECTED. The SARS-CoV-2 RNA is generally detectable in upper respiratoy specimens during the acute phase of infection. The lowest concentration of SARS-CoV-2 viral copies this assay can detect is 131 copies/mL. A negative result does not preclude SARS-Cov-2 infection and should not be used as the sole basis for treatment or other patient management decisions. A negative result may occur with  improper specimen collection/handling, submission of specimen other than nasopharyngeal swab, presence of viral mutation(s) within the areas targeted by this  assay, and inadequate number of viral copies (<131 copies/mL). A negative result must be combined with clinical observations, patient history, and epidemiological information. The expected result is Negative. Fact Sheet for Patients:  PinkCheek.be Fact Sheet for Healthcare Providers:  GravelBags.it This test is not yet ap proved or cleared by the [place] FDA and  has been authorized for detection and/or diagnosis of SARS-CoV-2 by FDA under an Emergency Use Authorization (EUA). This EUA will remain  in effect (meaning this test can be used) for the duration of the COVID-19 declaration under Section 564(b)(1) of the Act, 21 U.S.C. section 360bbb-3(b)(1), unless the authorization is terminated or revoked sooner.    Influenza A by PCR NEGATIVE NEGATIVE Final   Influenza B by PCR NEGATIVE NEGATIVE Final    Comment: (NOTE) The Xpert Xpress SARS-CoV-2/FLU/RSV assay is intended as an aid in  the diagnosis of influenza from Nasopharyngeal swab specimens and  should not be used as a sole basis for treatment. Nasal washings and  aspirates are unacceptable for Xpert Xpress SARS-CoV-2/FLU/RSV  testing. Fact Sheet for Patients: PinkCheek.be Fact Sheet for Healthcare Providers: GravelBags.it This test is not yet approved or cleared by the [place] FDA and  has been authorized for detection and/or diagnosis of SARS-CoV-2 by  FDA under an Emergency Use Authorization (EUA). This EUA will remain  in effect (meaning this test can be used) for the duration of the  Covid-19 declaration under Section 564(b)(1) of the Act, 21  U.S.C. section 360bbb-3(b)(1), unless the authorization is  terminated or revoked. Performed at [facility], [place] [address], [place], [place] [address]   Culture, blood (routine x 2)     Status: None   Collection Time: 05/26/19  3:20 PM   Specimen:  BLOOD RIGHT ARM  Result Value Ref Range Status   Specimen Description BLOOD RIGHT ARM  Final  Special Requests   Final    BOTTLES DRAWN AEROBIC ONLY Blood Culture results may not be optimal due to an inadequate volume of blood received in culture bottles   Culture   Final    NO GROWTH 5 DAYS Performed at [facility], [place] [address], [place], [place] [address]    Report Status 05/31/2019 FINAL  Final  Culture, blood (routine x 2)     Status: None   Collection Time: 05/26/19  3:27 PM   Specimen: BLOOD RIGHT HAND  Result Value Ref Range Status   Specimen Description BLOOD RIGHT HAND  Final   Special Requests   Final    BOTTLES DRAWN AEROBIC ONLY Blood Culture results may not be optimal due to an inadequate volume of blood received in culture bottles   Culture   Final    NO GROWTH 5 DAYS Performed at [facility], [place] [address], [place], [place] [address]    Report Status 05/31/2019 FINAL  Final  Expectorated sputum assessment w rflx to resp cult     Status: None   Collection Time: 05/27/19 11:54 AM   Specimen: Sputum  Result Value Ref Range Status   Specimen Description SPUTUM  Final   Special Requests NONE  Final   Sputum evaluation   Final    THIS SPECIMEN IS ACCEPTABLE FOR SPUTUM CULTURE Performed at [facility], [place] [address] [place], [place] [address]    Report Status 05/27/2019 FINAL  Final  Culture, respiratory     Status: None   Collection Time: 05/27/19 11:54 AM   Specimen: SPU  Result Value Ref Range Status   Specimen Description SPUTUM  Final   Special Requests NONE Reflexed from [id number]  Final   Gram Stain   Final    MODERATE WBC PRESENT, PREDOMINANTLY PMN RARE GRAM POSITIVE COCCI    Culture   Final    RARE STAPHYLOCOCCUS AUREUS RARE GROUP B STREP(S.AGALACTIAE)ISOLATED TESTING AGAINST S. AGALACTIAE NOT ROUTINELY PERFORMED DUE TO PREDICTABILITY OF AMP/PEN/VAN SUSCEPTIBILITY. Performed at [facility], [place] [address],  [place], [place] [address]    Report Status 05/29/2019 FINAL  Final   Organism ID, Bacteria STAPHYLOCOCCUS AUREUS  Final      Susceptibility   Staphylococcus aureus - MIC*    CIPROFLOXACIN >=8 RESISTANT Resistant     ERYTHROMYCIN >=8 RESISTANT Resistant     GENTAMICIN <=0.5 SENSITIVE Sensitive     OXACILLIN 0.5 SENSITIVE Sensitive     TETRACYCLINE <=1 SENSITIVE Sensitive     VANCOMYCIN 1 SENSITIVE Sensitive     TRIMETH/SULFA <=10 SENSITIVE Sensitive     CLINDAMYCIN <=0.25 SENSITIVE Sensitive     RIFAMPIN <=0.5 SENSITIVE Sensitive     Inducible Clindamycin NEGATIVE Sensitive     * RARE STAPHYLOCOCCUS AUREUS  Culture, blood (routine x 2)     Status: None (Preliminary result)   Collection Time: 06/07/19 10:00 AM   Specimen: BLOOD RIGHT HAND  Result Value Ref Range Status   Specimen Description BLOOD RIGHT HAND  Final   Special Requests   Final    BOTTLES DRAWN AEROBIC ONLY Blood Culture adequate volume   Culture   Final    NO GROWTH 2 DAYS Performed at [facility], [address] [place],  [address]    Report Status PENDING  Incomplete  Culture, blood (routine x 2)     Status: None (Preliminary result)   Collection Time: 06/07/19 10:15 AM   Specimen: BLOOD  LEFT ARM  Result Value Ref Range Status   Specimen Description BLOOD LEFT ARM  Final   Special Requests   Final    BOTTLES DRAWN AEROBIC ONLY Blood Culture adequate volume   Culture   Final    NO GROWTH 2 DAYS Performed at [facility], [address] [place], [place] [address]    Report Status PENDING  Incomplete     Antimicrobials:   Antibiotics Given (last 72 hours)    Date/Time Action Medication Dose Rate   06/07/19 1137 New Bag/Given   vancomycin (VANCOCIN) 2,500 mg in sodium chloride 0.9 % 500 mL IVPB 2,500 mg 125 mL/hr   06/07/19 1315 New Bag/Given   ceFEPIme (MAXIPIME) 2 g in sodium chloride 0.9 % 100 mL IVPB 2 g 200 mL/hr   06/07/19 2230 New Bag/Given   ceFEPIme (MAXIPIME) 2 g in sodium  chloride 0.9 % 100 mL IVPB 2 g 200 mL/hr   06/08/19 1035 New Bag/Given   ceFEPIme (MAXIPIME) 2 g in sodium chloride 0.9 % 100 mL IVPB 2 g 200 mL/hr   06/08/19 1235 New Bag/Given   vancomycin (VANCOCIN) IVPB 1000 mg/200 mL premix 1,000 mg 200 mL/hr   06/08/19 2226 New Bag/Given   ceFEPIme (MAXIPIME) 2 g in sodium chloride 0.9 % 100 mL IVPB 2 g 200 mL/hr   06/10/19 0000 New Bag/Given   ceFEPIme (MAXIPIME) 2 g in sodium chloride 0.9 % 100 mL IVPB 2 g 200 mL/hr      Interim history/subjective:  Uneventful night. Patient noted to have sodium 161 early this AM.  Objective   Blood pressure 100/67, pulse 84, temperature (!) 100.4 F (38 C), resp. rate (!) 30, height 5\' 8"  (1.727 m), weight 115.6 kg, SpO2 100 %.    Vent Mode: PRVC FiO2 (%):  [40 %] 40 % Set Rate:  [30 bmp] 30 bmp Vt Set:  [540 mL] 540 mL PEEP:  [5 cmH20] 5 cmH20 Plateau Pressure:  [21 cmH20-29 cmH20] 29 cmH20   Intake/Output Summary (Last 24 hours) at 06/10/2019 0910 Last data filed at 06/10/2019 0600 Gross per 24 hour  Intake 555.59 ml  Output 2275 ml  Net -1719.41 ml   Filed Weights   06/08/19 0500 06/09/19 0615 06/10/19 0400  Weight: 118 kg 118.4 kg 115.6 kg    Examination: General: Unresponsive, ill-appearing HENT: Normocephalic, atraumatic, ET tube in place Lungs: Left lower lobe wheeze, right lower lobe crackles Cardiovascular: RRR, S1-S2 clear, no murmurs appreciated Abdomen: Soft, normal bowel sounds appreciated Extremities: Trace edema to bilateral lower extremities, pulses within normal limits, difficult to palpate secondary to right-sided myoclonus Neuro: Unresponsive to voice and painful stimulation, right-sided myoclonus appreciated today  Resolved Hospital Problem list   Cardiogenic shock secondary to V. 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 Due to the extensive neurological injury the patient has a poor prognosis. - Goals of care discussion ongoing with son, expected to visit in person today 06/10/2019.  Patient is a modified DNR (all interventions appreciated except chest compressions), will likely transition to full comfort care after son's arrival - Continue below medication regimen -ContinueVersed infusion 10mg /hr,Fentanyl 100-352mcg/hr -ContinueValproic Acid 500mg  BID per tube and Keppra 1000mg  BID - Seizure Precautions in place  -Clonazepam 2mg  BID per tube - Oxycodone 5mg  q6 per tube - Quetiapine 25mg  daily  HFrEF (EF 25-30% G1DD) -Continue amiodarone400mg  BID. Hemodynamically stable. -Atorvastatin80mg  daily - could discontinue d/t recent change to plan of care  Fever ON, improving WBC count: WBC 21>15.8, fever ON100.4 F 06/10/2019.  Blood culture showing no growth at 2 days.  Sputum culture still process. -Cefepime and Vancomycinper pharmacy - Tylenol PRN fevers  Acute hypoxemic respiratory failure,secondary to pulmonary edema from [facility] acquired/Aspiration Pneumonia. Diuresed 2.5 L (net 1.9 L out) 02/04. Lasix IV 80 x1. Bronchoscopy 02/02 revealed feed-appearing material in the lungs concerning for aspiration. - Stop feeding supplement -Cefepime and vancomycin per pharmacy -Continue pulmonary hygiene,Chest physiotherapy -Torsemide 20 mg p.o. daily  Acute renal failure due to cardiorenal syndrome:serum creatinine slightly improved to 3.55> 3.26 on 2/5.  Patient diuresed 1.9 L out yesterday on torsemide 20 mg. - Monitor BMP, urine output -Torsemide 20 mg daily p.o.  Hypernatremia,  acutely worse: 153>16102/09/2019.High risk for DI given his brain injury. -Started on half-NS infusion 100 cc/h.  T2DM, chronic, stable: A1c 6.4%. CBG 06/185-215. - SSI q4 hours PRN  Hyperglycemia -CBG checks every 4 hours - Levemir 10 units daily  DVT Prophylaxis: Heparin 5,000 units q8  Best practice:  Diet: NPO due to aspirating tube feeds Pain/Anxiety/Delirium protocol (if indicated): Tylenol, fentanyl, Versed, Klonopin DVT prophylaxis: Heparin 5000 q8 GI prophylaxis: PPI Glucose control: Levemir 5 units Mobility: Nonambulatory Code Status: Partial DNR (chemical resuscitation and intubation appropriate; no chest compressions) Family Communication: Patient's son expected for end-of-life discussion Friday morning 2/5. Disposition: Plan hospital death  Labs   CBC: Recent Labs  Lab 06/06/19 0332 06/07/19 0246 06/08/19 0219 06/09/19 0210 06/10/19 0228  WBC 23.8* 31.7* 26.6* 21.1* 15.8*  NEUTROABS 17.7* 27.0* 22.5* 15.8* 11.5*  HGB 10.4* 10.4* 9.4* 9.1* 9.0*  HCT 35.6* 34.6* 31.1* 30.4* 30.6*  MCV 78.1* 76.9* 77.2* 77.2* 76.5*  PLT 421* 534* 565* 614* 670*    Basic Metabolic Panel: Recent Labs  Lab 06/05/19 1925 06/06/19 0332 06/07/19 1005 06/09/19 0210 06/10/19 0228  NA 153* 153* 149* 153* 161*  K 3.3* 3.5 4.6 3.9 3.7  CL 115* 113* 112* 119* 123*  CO2 24 24 20* 21* 22  GLUCOSE 147* 121* 137* 134* 151*  BUN 107* 99* 80* 124* 125*  CREATININE 2.07* 2.06* 2.14* 3.55* 3.26*  CALCIUM 9.7 10.2 9.5 9.3 9.7  MG  --  2.3  --   --   --    GFR: Estimated Creatinine Clearance: 30.5 mL/min (A) (by C-G formula based on SCr of 3.26 mg/dL (H)). Recent Labs  Lab 06/07/19 0246 06/08/19 0219 06/09/19 0210 06/10/19 0228  WBC 31.7* 26.6* 21.1* 15.8*   Liver Function Tests: No results for input(s): AST, ALT, ALKPHOS, BILITOT, PROT, ALBUMIN in the last 168 hours. No results for input(s): LIPASE, AMYLASE in the last 168 hours. No results for input(s): AMMONIA in the last 168 hours.  ABG    Component Value Date/Time   PHART 7.436 05/29/2019 1050   PCO2ART 26.7 (L) 05/29/2019 1050   PO2ART 94.7 05/29/2019 1050   HCO3 17.9 (L) 05/29/2019 1050    TCO2 21 (L) 05/29/2019 0328   ACIDBASEDEF 5.8 (H) 05/29/2019 1050   O2SAT 97.5 05/29/2019 1050   Coagulation Profile: No results for input(s): INR, PROTIME in the last 168 hours.  Cardiac Enzymes: No results for input(s): CKTOTAL, CKMB, CKMBINDEX, TROPONINI in the last 168 hours.  HbA1C: Hgb A1c MFr Bld  Date/Time Value Ref Range Status  05/23/2019 07:28 AM 6.4 (H) 4.8 - 5.6 % Final  Comment:    (NOTE) Pre diabetes:          5.7%-6.4% Diabetes:              >6.4% Glycemic control for   <7.0% adults with diabetes     CBG: Recent Labs  Lab 06/09/19 1603 06/09/19 2009 06/09/19 2316 06/10/19 0417 06/10/19 0820  GLUCAP 97 106* 121* 109* 30   Past Medical History  He,  has a past medical history of Allergy, Anxiety, Arthritis, Asthma, Depression, Heart murmur, Hyperlipidemia, Hypertension, and Osteoporosis.   Surgical History    Past Surgical History:  Procedure Laterality Date   NO PAST SURGERIES     RIGHT/LEFT HEART CATH AND CORONARY ANGIOGRAPHY N/A 06/03/2019   Procedure: RIGHT/LEFT HEART CATH AND CORONARY ANGIOGRAPHY;  Surgeon: Jettie Booze, MD;  Location: [facility];  Service: Cardiovascular;  Laterality: N/A;     Social History   reports that he has quit smoking. He has never used smokeless tobacco. He reports current alcohol use. He reports current drug use. Drug: "Crack" cocaine.   Family History   His family history is negative for Colon cancer, Colon polyps, Esophageal cancer, Rectal cancer, and Stomach cancer.   Allergies No Known Allergies   Home Medications  Prior to Admission medications   Medication Sig Start Date End Date Taking? Authorizing Provider  furosemide (LASIX) 40 MG tablet Take 40 mg by mouth daily as needed for fluid. 04/20/19  Yes [provider]  losartan-hydrochlorothiazide (HYZAAR) 100-25 MG tablet Take 1 tablet by mouth daily.  05/12/18  Yes [provider]  OVER THE COUNTER MEDICATION Neutragenix  daily   Yes [provider]  OVER THE COUNTER MEDICATION Take 1 tablet by mouth daily. Keto diet pill    Yes [provider]  PROAIR HFA 108 (90 Base) MCG/ACT inhaler Inhale 2 puffs into the lungs every 6 (six) hours as needed for wheezing.  05/12/18  Yes [provider]  tamsulosin (FLOMAX) 0.4 MG CAPS capsule Take 0.4 mg by mouth.   Yes [provider]     Critical care time: [place], [place], PGY-2 06/10/2019 9:10 AM

## 2019-06-10 NOTE — Progress Notes (Signed)
Patient continually seizing, versed gtt increased to 4 mg/hr in the setting of requiring multiple blouses

## 2019-06-11 LAB — BASIC METABOLIC PANEL
Anion gap: 12 (ref 5–15)
BUN: 121 mg/dL — ABNORMAL HIGH (ref 6–20)
CO2: 21 mmol/L — ABNORMAL LOW (ref 22–32)
Calcium: 9.3 mg/dL (ref 8.9–10.3)
Chloride: 126 mmol/L — ABNORMAL HIGH (ref 98–111)
Creatinine, Ser: 3.15 mg/dL — ABNORMAL HIGH (ref 0.61–1.24)
GFR calc Af Amer: 24 mL/min — ABNORMAL LOW (ref >60)
GFR calc non Af Amer: 21 mL/min — ABNORMAL LOW (ref >60)
Glucose, Bld: 98 mg/dL (ref 70–99)
Potassium: 4.2 mmol/L (ref 3.5–5.1)
Sodium: 159 mmol/L — ABNORMAL HIGH (ref 135–145)

## 2019-06-11 LAB — GLUCOSE, CAPILLARY
Glucose-Capillary: 108 mg/dL — ABNORMAL HIGH (ref 70–99)
Glucose-Capillary: 120 mg/dL — ABNORMAL HIGH (ref 70–99)
Glucose-Capillary: 72 mg/dL (ref 70–99)
Glucose-Capillary: 75 mg/dL (ref 70–99)
Glucose-Capillary: 78 mg/dL (ref 70–99)
Glucose-Capillary: 91 mg/dL (ref 70–99)
Glucose-Capillary: 92 mg/dL (ref 70–99)

## 2019-06-11 LAB — CBC WITH DIFFERENTIAL/PLATELET
Abs Immature Granulocytes: 0.21 10*3/uL — ABNORMAL HIGH (ref 0.00–0.07)
Basophils Absolute: 0.1 10*3/uL (ref 0.0–0.1)
Basophils Relative: 1 %
Eosinophils Absolute: 0.5 10*3/uL (ref 0.0–0.5)
Eosinophils Relative: 3 %
HCT: 32.6 % — ABNORMAL LOW (ref 39.0–52.0)
Hemoglobin: 9.4 g/dL — ABNORMAL LOW (ref 13.0–17.0)
Immature Granulocytes: 1 %
Lymphocytes Relative: 16 %
Lymphs Abs: 2.8 10*3/uL (ref 0.7–4.0)
MCH: 23.1 pg — ABNORMAL LOW (ref 26.0–34.0)
MCHC: 28.8 g/dL — ABNORMAL LOW (ref 30.0–36.0)
MCV: 80.1 fL (ref 80.0–100.0)
Monocytes Absolute: 1.5 10*3/uL — ABNORMAL HIGH (ref 0.1–1.0)
Monocytes Relative: 9 %
Neutro Abs: 11.8 10*3/uL — ABNORMAL HIGH (ref 1.7–7.7)
Neutrophils Relative %: 70 %
Platelets: 605 10*3/uL — ABNORMAL HIGH (ref 150–400)
RBC: 4.07 MIL/uL — ABNORMAL LOW (ref 4.22–5.81)
RDW: 17.2 % — ABNORMAL HIGH (ref 11.5–15.5)
WBC: 16.9 10*3/uL — ABNORMAL HIGH (ref 4.0–10.5)
nRBC: 0 % (ref 0.0–0.2)

## 2019-06-11 MED ORDER — PSYLLIUM 95 % PO PACK
1.0000 | PACK | Freq: Two times a day (BID) | ORAL | Status: DC
  Administered 2019-06-11 – 2019-06-12 (×3): 1
  Filled 2019-06-11 (×4): qty 1

## 2019-06-11 MED ORDER — SODIUM CHLORIDE 0.9 % IV SOLN
2.0000 g | Freq: Two times a day (BID) | INTRAVENOUS | Status: DC
  Administered 2019-06-11 – 2019-06-12 (×3): 2 g via INTRAVENOUS
  Filled 2019-06-11 (×4): qty 2

## 2019-06-11 MED ORDER — FREE WATER
400.0000 mL | Freq: Four times a day (QID) | Status: DC
  Administered 2019-06-11 – 2019-06-12 (×5): 400 mL

## 2019-06-11 NOTE — Progress Notes (Signed)
FPTS continues to socially follow this patient. Family was contacted by palliative 2/1. Son visited Thursday night but was not able to speak with palliative at that time due to evening hour. Spoke with RN today taking care of Mr. Sarti and they cannot get back in contact with the Son at this time to discuss [place].  Appreciate care provided by CCM in the ICU.  We will be happy to resume care when he is transferred to the floor.   Gerlene Fee, DO 06/11/2019, 9:56 AM PGY-1, [facility] pager [phone]

## 2019-06-11 NOTE — Progress Notes (Signed)
NAME:  Xavier Doyle, MRN:  [id number], DOB:  09-13-1960, LOS: 73 ADMISSION DATE:  05/13/2019, CONSULTATION DATE: 05/11/2019 REFERRING MD: Family practice teaching service, CHIEF COMPLAINT: Anoxic brain injury status post cardiac arrest  Brief History   59 year old man status post cardiac arrest with poor neurological recovery.  History of present illness   Xavier Doyle is a 59 year old male with history of hypertension, hyperlipidemia, asthma, and depression admitted 05/10/2019 for chest pain.  Patient found to have newly diagnosed systolic heart failure with ejection fraction 25-30% with global hypokinesis.  Patient had cardiac arrest during hospitalization, suffered anoxic brain injury with poor neurological recovery.  Family has agreed to modified DNR status until family can fly in from [place] to be with patient, at which point he will most likely be transitioned to comfort care.  Past Medical History   Patient Active Problem List   Diagnosis Date Noted  . Adult failure to thrive   . Acute respiratory failure ([place])   . Anoxic brain damage ([facility])   . Palliative care by specialist   . DNR (do not resuscitate) discussion   . Endotracheally intubated   . Pressure injury of skin 05/28/2019  . Respiratory failure with hypoxia ([place]) 05/27/2019  . Nonischemic cardiomyopathy ([place]) 05/25/2019  . Chest pain 05/06/2019  . Cardiac arrest ([place])   . Ventricular fibrillation ([place])   . Prediabetes 05/23/2019  . Hyperlipidemia 05/23/2019  . Morbid obesity ([place]) 05/23/2019  . Essential hypertension 05/23/2019  . Acute HFrEF (heart failure with reduced ejection fraction) ([place])   . Chest pain with high risk for cardiac etiology 05/20/2019   Significant Hospital Events   1/17 admitted to [facility] for chest pain 1/19 V. 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  1/28 titrated off of dobutamine 2/01 made partial DNR (can have medicinal resuscitation, intubated; no chest compressions) 2/02 bronchoscopy to obtain sputum culture, assess new CXR opacity 2/05 son did not show up to assist with goals of care discussion.  Consults:  Palliative care, case management, critical care  Procedures:  1/19: EEG initiated, stopped 1/21 1/20: TTE 1/24: EEG due to myoclonic activity 2/02: bronchoscopy  Significant Diagnostic Tests:   CXR 2/3-increased aeration left lung.  Micro Data:  Tracheal aspirate 1/22-Staph aureus.  Interim history/subjective:  Uneventful night. Patient noted to have sodium 161 early this AM.  Continues to have stimulus-induced myoclonus.  Unable to reach son.  Objective   Blood pressure 99/81, pulse (!) 228, temperature 98.8 F (37.1 C), resp. rate (!) 30, height 5\' 8"  (1.727 m), weight 115.6 kg, SpO2 100 %.    Vent Mode: PRVC FiO2 (%):  [40 %] 40 % Set Rate:  [30 bmp] 30 bmp Vt Set:  [540 mL] 540 mL PEEP:  [5 cmH20] 5 cmH20 Plateau Pressure:  [13 cmH20-25 cmH20] 13 cmH20   Intake/Output Summary (Last 24 hours) at 06/11/2019 1114 Last data filed at 06/11/2019 0900 Gross per 24 hour  Intake 3168.21 ml  Output 1900 ml  Net 1268.21 ml   Filed Weights   06/08/19 0500 06/09/19 0615 06/10/19 0400  Weight: 118 kg 118.4 kg 115.6 kg  Examination: General: Unresponsive, ill-appearing HENT: Normocephalic, atraumatic, ET tube in place Lungs: Left lower lobe wheeze, right lower lobe crackles Cardiovascular: RRR, S1-S2 clear, no murmurs appreciated Abdomen: Soft, normal bowel sounds appreciated Extremities: Trace edema to bilateral lower extremities, pulses within  normal limits, difficult to palpate secondary to right-sided myoclonus Neuro: Unresponsive to voice and painful stimulation, right-sided myoclonus appreciated today  Resolved Hospital Problem list   Cardiogenic shock secondary to V. 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 Due to the extensive neurological injury the patient has a poor prognosis. - Goals of care discussion ongoing with son, expected to visit in person today 06/10/2019.  Patient is a modified DNR (all interventions appreciated except chest compressions), will likely transition to full comfort care after son's arrival - Continue below medication regimen -ContinueVersed infusion 10mg /hr,Fentanyl 100-337mcg/hr -ContinueValproic Acid 500mg  BID per tube and Keppra 1000mg  BID - Seizure Precautions in place  -Clonazepam 2mg  BID per tube - Oxycodone 5mg  q6 per tube - Quetiapine 25mg  daily  HFrEF (EF 25-30% G1DD) -Continue amiodarone400mg  BID. Hemodynamically stable. -Atorvastatin80mg  daily - could discontinue d/t recent change to plan of care  Fever ON, improving WBC count: WBC 21>15.8, fever ON100.4 F 06/10/2019.  Blood culture showing no growth at 2 days.  Sputum culture still process. -Cefepime and Vancomycinper pharmacy - Tylenol PRN fevers  Acute hypoxemic respiratory failure,secondary to pulmonary edema from [facility] acquired/Aspiration Pneumonia. Diuresed 2.5 L (net 1.9 L out) 02/04. Lasix IV 80 x1. Bronchoscopy 02/02 revealed feed-appearing material in the lungs concerning for aspiration. - Stop feeding supplement -Cefepime and vancomycin per pharmacy -Continue pulmonary hygiene,Chest physiotherapy -Torsemide 20 mg p.o. daily  Acute renal  failure due to cardiorenal syndrome:serum creatinine slightly improved to 3.55> 3.26 on 2/5.  Patient diuresed 1.9 L out yesterday on torsemide 20 mg. - Monitor BMP, urine output -Torsemide 20 mg daily p.o.  Hypernatremia,  acutely worse: 153>16102/09/2019.High risk for DI given his brain injury. -Increased free water via tube  T2DM, chronic, stable: A1c 6.4%. CBG 06/185-215. - SSI q4 hours PRN Hyperglycemia -CBG checks every 4 hours - Levemir 10 units daily  DVT Prophylaxis: Heparin 5,000 units q8  Best practice:  Diet: NPO due to aspirating tube feeds Pain/Anxiety/Delirium protocol (if indicated): Tylenol, fentanyl, Versed, Klonopin DVT prophylaxis: Heparin 5000 q8 GI prophylaxis: PPI Glucose control: Levemir 5 units Mobility: Nonambulatory Code Status: Partial DNR (chemical resuscitation and intubation appropriate; no chest compressions) Family Communication:  Unable to reach son by phone this morning twice.  Called patient's sister who is also been having difficulty reaching the son.  She agrees that the patient needs to be transition to comfort care. Disposition:  ICU with plan to compassionately extubate.  Labs   CBC: Recent Labs  Lab 06/07/19 0246 06/08/19 0219 06/09/19 0210 06/10/19 0228 06/11/19 0651  WBC 31.7* 26.6* 21.1* 15.8* 16.9*  NEUTROABS 27.0* 22.5* 15.8* 11.5* 11.8*  HGB 10.4* 9.4* 9.1* 9.0* 9.4*  HCT 34.6* 31.1* 30.4* 30.6* 32.6*  MCV 76.9* 77.2* 77.2* 76.5* 80.1  PLT 534* 565* 614* 670* 605*    Basic Metabolic Panel: Recent Labs  Lab 06/06/19 0332 06/06/19 0332 06/07/19 1005 06/09/19 0210 06/10/19 0228 06/10/19 1110 06/11/19 0651  NA 153*   < > 149* 153* 161* 158* 159*  K 3.5   < > 4.6 3.9 3.7 3.9 4.2  CL 113*   < > 112* 119* 123* 126* 126*  CO2 24   < > 20* 21* 22 21* 21*  GLUCOSE 121*   < > 137* 134* 151* 127* 98  BUN 99*   < > 80* 124* 125* 123* 121*  CREATININE 2.06*   < > 2.14* 3.55* 3.26* 3.29* 3.15*  CALCIUM 10.2   < >  9.5 9.3 9.7 9.1 9.3  MG 2.3  --   --   --   --   --   --    < > = values in this interval not displayed.   GFR: Estimated Creatinine Clearance: 31.6 mL/min (A) (by C-G formula based on SCr of 3.15 mg/dL (H)). Recent Labs  Lab 06/08/19 0219 06/09/19 0210 06/10/19 0228 06/11/19 0651  WBC 26.6* 21.1* 15.8* 16.9*   Liver Function Tests: No results for input(s): AST, ALT, ALKPHOS, BILITOT, PROT, ALBUMIN in the last 168 hours. No results for input(s): LIPASE, AMYLASE in the last 168 hours. No results for input(s): AMMONIA in the last 168 hours.  ABG    Component Value Date/Time   PHART 7.436 05/29/2019 1050   PCO2ART 26.7 (L) 05/29/2019 1050   PO2ART 94.7 05/29/2019 1050   HCO3 17.9 (L) 05/29/2019 1050   TCO2 21 (L) 05/29/2019 0328   ACIDBASEDEF 5.8 (H) 05/29/2019 1050   O2SAT 97.5 05/29/2019 1050   Coagulation Profile: No results for input(s): INR, PROTIME in the last 168 hours.  Cardiac Enzymes: No results for input(s): CKTOTAL, CKMB, CKMBINDEX, TROPONINI in the last 168 hours.  HbA1C: Hgb A1c MFr Bld  Date/Time Value Ref Range Status  05/23/2019 07:28 AM 6.4 (H) 4.8 - 5.6 % Final    Comment:    (NOTE) Pre diabetes:          5.7%-6.4% Diabetes:              >6.4% Glycemic control for   <7.0% adults with diabetes     CBG: Recent Labs  Lab 06/10/19 1556 06/10/19 1929 06/11/19 0026 06/11/19 0406 06/11/19 0826  GLUCAP 104* 91 91 78 72  06/11/2019 11:14 AM   Xavier Brood, MD [facility] ICU Physician [place]  Pager: [phone] Mobile: [phone] After hours: [phone].

## 2019-06-12 LAB — CBC
HCT: 30.9 % — ABNORMAL LOW (ref 39.0–52.0)
Hemoglobin: 8.7 g/dL — ABNORMAL LOW (ref 13.0–17.0)
MCH: 23 pg — ABNORMAL LOW (ref 26.0–34.0)
MCHC: 28.2 g/dL — ABNORMAL LOW (ref 30.0–36.0)
MCV: 81.7 fL (ref 80.0–100.0)
Platelets: 654 10*3/uL — ABNORMAL HIGH (ref 150–400)
RBC: 3.78 MIL/uL — ABNORMAL LOW (ref 4.22–5.81)
RDW: 17.2 % — ABNORMAL HIGH (ref 11.5–15.5)
WBC: 16.3 10*3/uL — ABNORMAL HIGH (ref 4.0–10.5)
nRBC: 0 % (ref 0.0–0.2)

## 2019-06-12 LAB — CULTURE, BLOOD (ROUTINE X 2)
Culture: NO GROWTH
Culture: NO GROWTH
Special Requests: ADEQUATE
Special Requests: ADEQUATE

## 2019-06-12 LAB — GLUCOSE, CAPILLARY
Glucose-Capillary: 79 mg/dL (ref 70–99)
Glucose-Capillary: 71 mg/dL (ref 70–99)
Glucose-Capillary: 96 mg/dL (ref 70–99)

## 2019-06-12 MED ORDER — DIPHENHYDRAMINE HCL 50 MG/ML IJ SOLN: 25.0000 mg | INTRAMUSCULAR | Status: DC | PRN

## 2019-06-12 MED ORDER — DEXTROSE 5 % IV SOLN: INTRAVENOUS | Status: DC

## 2019-06-12 MED ORDER — GLYCOPYRROLATE 0.2 MG/ML IJ SOLN: 0.2000 mg | INTRAMUSCULAR | Status: DC | PRN

## 2019-06-12 MED ORDER — ACETAMINOPHEN 650 MG RE SUPP: 650.0000 mg | Freq: Four times a day (QID) | RECTAL | Status: DC | PRN

## 2019-06-12 MED ORDER — POLYVINYL ALCOHOL 1.4 % OP SOLN
1.0000 [drp] | Freq: Four times a day (QID) | OPHTHALMIC | Status: DC | PRN
  Filled 2019-06-12: qty 15

## 2019-06-12 MED ORDER — GLYCOPYRROLATE 1 MG PO TABS
1.0000 mg | ORAL_TABLET | ORAL | Status: DC | PRN
  Filled 2019-06-12: qty 1

## 2019-06-12 MED ORDER — ACETAMINOPHEN 325 MG PO TABS: 650.0000 mg | ORAL_TABLET | Freq: Four times a day (QID) | ORAL | Status: DC | PRN

## 2019-07-04 NOTE — Procedures (Signed)
Extubation Procedure Note  Patient Details:   Name: Xavier Doyle DOB: 11/03/1960 MRN: [id number]   Airway Documentation:    Vent end date: 06/27/2019 Vent end time: 1520   Evaluation  O2 sats: currently acceptable Complications: No apparent complications Patient did tolerate procedure. Bilateral Breath Sounds: Rhonchi, Diminished   No   Pt terminally extubated per MD order.  RT will continue to monitor. Pierre Bali 06-27-2019, 3:32 PM

## 2019-07-04 NOTE — Death Summary Note (Signed)
DEATH SUMMARY   Patient Details  Name: Xavier Doyle MRN: [id number] DOB: Jul 13, 1960  Admission/Discharge Information   Admit Date:  2019/05/30  Date of Death: Date of Death: 06/20/2019  Time of Death: Time of Death: 54  Length of Stay: 07/30/22  Referring Physician: System, Pcp Not In   Reason(s) for Hospitalization  Shortness of breath  Diagnoses  Preliminary cause of death: Anoxic brain injury Secondary Diagnoses (including complications and co-morbidities):  Principal Problem:   Ventricular fibrillation ([place]) Active Problems:   Chest pain with high risk for cardiac etiology   Prediabetes   Hyperlipidemia   Morbid obesity ([place])   Essential hypertension   Acute HFrEF (heart failure with reduced ejection fraction) ([place])   Chest pain   Cardiac arrest ([place])   Nonischemic cardiomyopathy ([place])   Respiratory failure with hypoxia ([place])   Pressure injury of skin   Palliative care by specialist   DNR (do not resuscitate) discussion   Endotracheally intubated   Anoxic brain damage ([place])   Acute respiratory failure ([place])   Adult failure to thrive   Brief Hospital Course (including significant findings, care, treatment, and services provided and events leading to death)  Xavier Doyle is a 59 y.o. year old male who presented with shortness of breath and was found to have a new nonischemic cardiomyopathy.  He was started on heart failure therapy but suffered a polymorphic VT cardiac arrest.  He subsequently developed respiratory failure cardiogenic shock and renal failure all of which improved over time.  He was weaned off vasoactive drugs and and his renal function improved somewhat with diuresis. 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  There was initially some difficulty in figuring out who  was his substitute decision maker.  Ultimately, we made contact with an estranged son living in [place].  He initially seemed very reluctant to make decisions but eventually made him a DNR and came to [facility] with what we believed to be intention of proceeding to comfort care.  We were then unable to contact the son for final decision.  We then contacted other family members who all concurred that living connected to machines in a vegetative state would be against the patient's wishes but none of the other family members either were able to reach the son.  Given that we had made a best effort attempt to reach the son and that we had the consensus of the other family members it was decided between myself and the palliative care nurse practitioner that we had sufficient grounds based on an expression of the patient's wishes and his clearly poor neurological prognosis to proceed with a compassionate extubation.  The patient passed away and the family was informed by the nursing staff.    Pertinent Labs and Studies  Significant Diagnostic Studies DG Chest 1 View  Result Date: 05/15/2019 CLINICAL DATA:  Central line placement OG tube placement EXAM: CHEST  1 VIEW COMPARISON:  06/04/2019 FINDINGS: Endotracheal tube tip is about 2.7 cm superior to the carina. Esophageal tube tip below the diaphragm but incompletely visualized. Left-sided central venous catheter tip partially obscured by support device, appears to be over the upper SVC. No pneumothorax. Low lung volumes. Enlarged cardiomediastinal silhouette. Increasing airspace disease at the left base. Right lung grossly clear. IMPRESSION: 1. Endotracheal tube tip about 2.7 cm superior to carina. Esophageal tube tip  below the diaphragm but incompletely visualized 2. Left IJ central venous catheter tip partially obscured, appears to overlie the SVC origin. No left pneumothorax 3. Increasing airspace disease at the left base.  Cardiomegaly. Electronically  Signed   By: Donavan Foil M.D.   On: 05/10/2019 19:16   DG Chest 2 View  Result Date: 05/21/2019 CLINICAL DATA:  Chest pain, shortness of breath. EXAM: CHEST - 2 VIEW COMPARISON:  None. FINDINGS: Mild cardiomegaly is noted. No pneumothorax or significant pleural effusion is noted. Lungs are clear. Bony thorax is unremarkable. IMPRESSION: No active cardiopulmonary disease. Electronically Signed   By: Marijo Conception M.D.   On: 05/25/2019 11:33   DG Abd 1 View  Result Date: 05/27/2019 CLINICAL DATA:  OG tube placement EXAM: ABDOMEN - 1 VIEW COMPARISON:  None. FINDINGS: Airspace disease at the left base. Esophageal tube tip overlies the distal stomach. IMPRESSION: Esophageal tube tip overlies the distal stomach. Electronically Signed   By: Donavan Foil M.D.   On: 05/13/2019 19:17   CARDIAC CATHETERIZATION  Result Date: 05/21/2019  Mid RCA lesion is 10% stenosed. No significant CAD.  LV end diastolic pressure is moderately elevated. LVEDP 26 mm Hg.  There is no aortic valve stenosis.  Hemodynamic findings consistent with mild pulmonary hypertension.  Ao sat 96%, PA sat 67%, mean PA pressure 29 mm Hg; mean PCWP 20; CO 6.3 L/min, CI 2.72  Medical therapy for nonischemic cardiomyopathy.   DG CHEST PORT 1 VIEW  Result Date: 06/08/2019 CLINICAL DATA:  Coronary artery disease, hypertension, asthma, previous tobacco abuse EXAM: PORTABLE CHEST 1 VIEW COMPARISON:  06/07/2019 FINDINGS: Single frontal view of the chest demonstrates stable endotracheal tube and enteric catheter. Improved aeration within the left lung, with persistent consolidation at the left lung base obscuring the left hemidiaphragm. Left pleural effusion not excluded. Continued central vascular congestion bilaterally, left greater than right. Otherwise the right chest is clear. No pneumothorax. IMPRESSION: 1. Improving aeration within the left lung, with persistent consolidation and/or effusion at the left lung base. 2. Stable central  vascular congestion. 3. Stable support devices. Electronically Signed   By: Randa Ngo M.D.   On: 06/08/2019 11:57   DG CHEST PORT 1 VIEW  Result Date: 06/07/2019 CLINICAL DATA:  LEFT lung consolidation EXAM: PORTABLE CHEST 1 VIEW COMPARISON:  Radiograph 06/07/2019 at 957 hours FINDINGS: Endotracheal tube and feeding tube unchanged. Near complete opacification of the LEFT hemithorax. There is some improved aeration in LEFT upper . RIGHT lung relatively clear with mild interstitial edema pattern. IMPRESSION: 1. Some mild improvement in aeration to the LEFT upper lobe. The majority of the LEFT hemithorax remains opacified. 2. Stable support apparatus. 3. Mild interstitial edema pattern in the well aerated RIGHT lung. Electronically Signed   By: Suzy Bouchard M.D.   On: 06/07/2019 16:03   DG CHEST PORT 1 VIEW  Result Date: 06/07/2019 CLINICAL DATA:  Fevers. EXAM: PORTABLE CHEST 1 VIEW COMPARISON:  06/06/2019 FINDINGS: ET tube tip is above the carina. There is a feeding tube with tip below the GE junction. Interval complete opacification of the left lung is identified compared with 06/06/2019. Findings are favored to represent complete atelectasis of the left lung which may reflect underlying mucous plugging. Increase interstitial markings within the right base also noted. IMPRESSION: Complete opacification of the left lung favored to represent complete atelectasis of the left lung which may reflect underlying mucous plugging. Follow-up imaging advised to ensure resolution. Increased interstitial markings noted within the right base. These  results will be called to the ordering clinician or representative by the Radiologist Assistant, and communication documented in the PACS or zVision Dashboard. Electronically Signed   By: Kerby Moors M.D.   On: 06/07/2019 10:10   DG CHEST PORT 1 VIEW  Result Date: 06/06/2019 CLINICAL DATA:  Status post cardiac arrest. EXAM: PORTABLE CHEST 1 VIEW COMPARISON:   06/05/2019 FINDINGS: The endotracheal tube is 3.8 cm above the carina. The feeding tube is coursing down the esophagus and into the stomach. Improved lung aeration with resolving right lower lobe atelectasis. No pneumothorax. IMPRESSION: Stable support apparatus. Improved lung aeration with resolving right lower lobe atelectasis. Electronically Signed   By: Marijo Sanes M.D.   On: 06/06/2019 06:46   DG Chest Port 1 View  Result Date: 06/05/2019 CLINICAL DATA:  Ventilator support. EXAM: PORTABLE CHEST 1 VIEW COMPARISON:  05/31/2019 FINDINGS: Endotracheal tube tip is 5 cm above the carina. Soft feeding tube enters the abdomen. Left internal jugular central line tip at the innominate SVC junction. Improved aeration in the left lower lobe. Worsened atelectasis in the right lower and middle lobes. Upper lungs remain clear. IMPRESSION: Lines and tubes satisfactory. Improved aeration in the left lower lobe. Worsened volume loss in the right middle lobe and lower lobe. Electronically Signed   By: Nelson Chimes M.D.   On: 06/05/2019 06:43   DG Chest Port 1 View  Result Date: 05/31/2019 CLINICAL DATA:  Acute respiratory failure EXAM: PORTABLE CHEST 1 VIEW COMPARISON:  Radiograph 05/30/2019 FINDINGS: *Endotracheal tube position in the mid trachea, 4 cm from the carina. *Transesophageal feeding tube tip terminates beyond the GE junction, below the level of imaging. *Left IJ central venous catheter tip terminates at the brachiocephalic-caval confluence. *Telemetry leads overlie the chest. There are patchy bilateral airspace opacities more focal confluent opacity is seen in the left lung base with obscuration of left hemidiaphragm which could reflect a combination of pleural fluid and parenchymal disease. No pneumothorax. Visualized cardiomediastinal contours are similar to prior accounting for differences in technique. No acute osseous or soft tissue abnormality. IMPRESSION: 1. Patchy bilateral airspace opacities are  similar to prior. 2. More confluent opacity in the left lung base is slightly decreased from prior. Could reflect a combination of pleural fluid and parenchymal disease. 3. Lines and tubes as above Electronically Signed   By: Lovena Le M.D.   On: 05/31/2019 03:34   DG CHEST PORT 1 VIEW  Result Date: 05/30/2019 CLINICAL DATA:  Ventilator support.  Follow-up. EXAM: PORTABLE CHEST 1 VIEW COMPARISON:  05/28/2019 FINDINGS: Endotracheal tube tip is 4 cm above the carina. Soft feeding tube enters the abdomen. Left internal jugular central line tip is in the SVC at the azygos level. Right lung shows improvement with better aeration in the lower lobe. Worsened opacity in the left hemithorax with less aerated upper lobe. Findings could be consistent with consolidation and pleural fluid. IMPRESSION: Worsened appearance on the left with less aerated lung. Consolidation and possible pleural fluid. Improved aeration of the right lung base. Electronically Signed   By: Nelson Chimes M.D.   On: 05/30/2019 09:20   DG CHEST PORT 1 VIEW  Result Date: 05/28/2019 CLINICAL DATA:  Dyspnea EXAM: PORTABLE CHEST 1 VIEW COMPARISON:  05/27/2019 FINDINGS: Moderate interstitial/airspace opacities in the lungs bilaterally. Small to moderate left pleural effusion. No pneumothorax. Cardiomegaly. Endotracheal tube terminates 3.5 cm above the carina. Left IJ venous catheter terminates in the mid SVC. Enteric tube courses into the stomach. IMPRESSION: Moderate interstitial/airspace opacities  in the lungs bilaterally, favoring interstitial edema over multifocal pneumonia, unchanged. Small to moderate left pleural effusion. Endotracheal tube terminates 3.5 cm above the carina. Additional support apparatus as above. Electronically Signed   By: Julian Hy M.D.   On: 05/28/2019 10:52   DG CHEST PORT 1 VIEW  Result Date: 05/27/2019 CLINICAL DATA:  Ventricular fibrillation, CHF.  Endotracheal tube. EXAM: PORTABLE CHEST 1 VIEW COMPARISON:   05/26/2019. FINDINGS: Endotracheal tube, NG tube, left IJ line in stable position. Cardiomegaly with progressive bilateral pulmonary infiltrates/edema. Small bilateral pleural effusions. Findings consistent with progressive CHF. Bilateral pneumonia cannot be excluded. No pneumothorax. IMPRESSION: 1.  Lines and tubes in stable position. 2. Cardiomegaly with progressive bilateral pulmonary infiltrates/edema. Small bilateral pleural effusions. Findings consistent progressive CHF. Bilateral pneumonia cannot be excluded. Electronically Signed   By: Marcello Moores  Register   On: 05/27/2019 05:50   DG CHEST PORT 1 VIEW  Result Date: 05/26/2019 CLINICAL DATA:  Dyspnea. Evaluate for possible aspiration. Intubated patient. EXAM: PORTABLE CHEST 1 VIEW COMPARISON:  05/10/2019 FINDINGS: There is opacity at both lung bases, likely a combination of atelectasis and pleural fluid. This has increased from the prior study. There is also bilateral perihilar opacity increased from the prior exam. These findings are accentuated by low lung volumes. Endotracheal tube tip projects 2 point 8 cm above the carina. Left internal jugular central venous catheter has its tip at the confluence of the brachiocephalic vein and superior vena cava. Nasal/orogastric tube passes below the diaphragm well into the stomach. IMPRESSION: 1. Worsened lung aeration compared to the prior study. There are perihilar bilateral lung base opacities. Suspect combination of pulmonary edema with lung base atelectasis and pleural effusions. Pneumonia or aspiration pneumonitis is not excluded. 2. Stable support apparatus. Electronically Signed   By: Lajean Manes M.D.   On: 05/26/2019 15:49   DG Chest Port 1 View  Result Date: 05/18/2019 CLINICAL DATA:  Status post intubation. EXAM: PORTABLE CHEST 1 VIEW COMPARISON:  May 22, 2019 FINDINGS: An endotracheal tube is seen with its distal tip approximately 4.7 cm from the carina. This represents a new finding when  compared to the prior study. Mildly radiopaque tags and labels are seen overlying the left lung with subsequently limited evaluation of the pulmonary parenchyma within these regions. There is no evidence of acute infiltrate, pleural effusion or pneumothorax. There is mild to moderate severity enlargement of the cardiac silhouette. The visualized skeletal structures are unremarkable. IMPRESSION: 1. Interval endotracheal tube placement and positioning, as described above, when compared to the prior chest plain film dated May 22, 2019. Electronically Signed   By: Virgina Norfolk M.D.   On: 06/03/2019 17:43   DG Abd Portable 1V  Result Date: 06/07/2019 CLINICAL DATA:  OG tube placement. EXAM: PORTABLE ABDOMEN - 1 VIEW COMPARISON:  05/27/2019 FINDINGS: Feeding tube tip appears to be within the proximal jejunum beyond the ligament of Treitz in the same position as on the prior study. The visualized bowel gas pattern is normal. Dense consolidation in the left mid and lower lung zones. IMPRESSION: Feeding tube tip appears to be in the proximal jejunum. Electronically Signed   By: Lorriane Shire M.D.   On: 06/07/2019 12:02   DG Abd Portable 1V  Result Date: 05/27/2019 CLINICAL DATA:  Feeding tube placement EXAM: PORTABLE ABDOMEN - 1 VIEW COMPARISON:  Abdominal radiograph obtained earlier in the day FINDINGS: Nasogastric tube has been removed. Feeding tube now present with tip in proximal jejunum. There is paucity of bowel gas.  No bowel dilatation or free air evident. There is consolidation in the left lower lobe as well as more patchy infiltrate in the right base. IMPRESSION: 1.  Feeding tube tip in proximal jejunum. 2. Paucity of bowel gas. This finding raises concern for a degree of ileus or enteritis. Bowel obstruction less likely. No free air. 3. Consolidation throughout the left lower lobe with more patchy infiltrate left base. Small left pleural effusion cannot be excluded. Electronically Signed   By:  Lowella Grip III M.D.   On: 05/27/2019 13:49   DG Abd Portable 1V  Result Date: 05/27/2019 CLINICAL DATA:  Impaired nasogastric feeding tube. EXAM: PORTABLE ABDOMEN - 1 VIEW COMPARISON:  One-view abdomen 05/23/2019. One-view chest x-ray 05/27/2019. FINDINGS: Side port of the NG tube is in the stomach. The stomach is decompressed. Left greater than right basilar airspace disease is noted. The heart is enlarged. Bowel gas pattern is unremarkable. IMPRESSION: 1. Side port of the NG tube is in the stomach. 2. Left greater than right basilar airspace disease. Electronically Signed   By: San Morelle M.D.   On: 05/27/2019 05:14   EEG adult  Result Date: 05/18/2019 Lora Havens, MD     05/19/2019  9:05 PM Patient Name: Takahiro Baldry MRN: [id number] Epilepsy Attending: Lora Havens Referring Physician/Provider: Merlene Laughter, NP Date: 05/11/2019 Duration: 21.03 mins Patient history: 59yo s/p cardiac arrest now on TTM. EEG to evaluate for seizure Level of alertness: comatose AEDs during EEG study: Propofol Technical aspects: This EEG study was done with scalp electrodes positioned according to the 10-20 International system of electrode placement. Electrical activity was acquired at a sampling rate of 500Hz  and reviewed with a high frequency filter of 70Hz  and a low frequency filter of 1Hz . EEG data were recorded continuously and digitally stored. DESCRIPTION: EEG showed continuous generalized background suppression. Intermittent generalized high amplitude sharply contoured 4-6zh theta slowing was also noted. Hyperventilation and photic stimulation were not performed. ABNORMALITY - Background suppression, generalized - Intermittent slow, generalized IMPRESSION: This study is showed evidence of profound diffuse encephalopathy, non specific to etiology. No seizures or definite epileptiform discharges were seen throughout the recording. Priyanka [name]   Overnight EEG with video  Result Date:  05/25/2019 Lora Havens, MD     05/26/2019  9:09 AM Patient Name: Briggs Alcide MRN: [id number] Epilepsy Attending: Lora Havens Referring Physician/Provider: Merlene Laughter, NP Duration: 05/27/2019 2048 to 05/25/2019 2048  Patient history: 59yo s/p cardiac arrest now on TTM. EEG to evaluate for seizure  Level of alertness: comatose  AEDs during EEG study: Propofol, Keppra  Technical aspects: This EEG study was done with scalp electrodes positioned according to the 10-20 International system of electrode placement. Electrical activity was acquired at a sampling rate of 500Hz  and reviewed with a high frequency filter of 70Hz  and a low frequency filter of 1Hz . EEG data were recorded continuously and digitally stored.  DESCRIPTION: EEG showed continuous generalized background suppression. Intermittent generalized high amplitude sharply contoured 4-6zh theta slowing was also noted. Hyperventilation and photic stimulation were not performed. Event button was pressed multiple times (at 2340 and 2349 on 05/06/2019, at 1358, 1402, 1403, 1405, 1406, 1416, 1426, 1445, 1451, 1518, 1530, 1536, 1552, 1609, 1948 on 05/25/2019) for unclear reasons.  Concomitant EEG at times showed high amplitude sharply contoured 4 to 6 Hz theta slowing without clear evolution.  ABNORMALITY - Burst suppression, generalized  IMPRESSION: This study is showed evidence of profound diffuse encephalopathy, non specific to etiology.  No seizures or definite epileptiform discharges were seen throughout the recording.  Priyanka [name]   EEG LTVM - Continuous Bedside W/ Video Includes Portable EEG Read  Result Date: 05/30/2019 Lora Havens, MD     05/30/2019  1:52 PM Patient Name:Chon Nyoka Cowden [id number] Epilepsy Attending:Priyanka [name] Referring Physician/Provider:Dr. Dyann Ruddle Duration:05/29/2019 1529 to 05/30/2019 1036  Patient history:58yo s/p cardiac arrest now on TTM. EEG to evaluate for seizure  Level of  alertness:comatose  AEDs during EEG study:Keppra, valproate, Versed, clonazepam  Technical aspects: This EEG study was done with scalp electrodes positioned according to the 10-20 International system of electrode placement. Electrical activity was acquired at a sampling rate of 500Hz  and reviewed with a high frequency filter of 70Hz  and a low frequency filter of 1Hz . EEG data were recorded continuously and digitally stored.  DESCRIPTION: EEG showed continuous generalized background suppression. EEG was reactive to tactile stimulation. Hyperventilation and photic stimulation were not performed.  EKG artifact was seen throughout the study. Event button was pressed multiple times during the study (On 05/29/2019 1701 and 2026, on 05/30/2019 at 739, 0800 and 0854). On video, patient does appear to have whole-body twitching or right or left side twitching. Concomitant EEG before during and after the event and does not show any EEG change to suggest seizure.  ABNORMALITY -Backgroundsuppression, generalized   IMPRESSION: This study isshowed evidence of profound diffuse encephalopathy, non specific to etiology. No seizures ordefiniteepileptiform discharges were seen throughout the recording. Event button was pressed multiple times as described above for whole body twitching and left or right leg twitching without concomitant EEG change and is therefore not epileptic. Of note, it appears that these episodes may be triggered by stimulation and given the semiology of the events could be stimulation induced myoclonus or subcortical myoclonus.  Lora Havens  ECHOCARDIOGRAM COMPLETE  Result Date: 05/25/2019   ECHOCARDIOGRAM REPORT   Patient Name:   JAVARIAN WIZNER Date of Exam: 05/25/2019 Medical Rec #:  [id number]   Height:       68.0 in Accession #:    [id number]  Weight:       280.2 lb Date of Birth:  [birth year]   BSA:          2.36 m Patient Age:    77 years    BP:           129/77 mmHg Patient Gender: M            HR:           43 bpm. Exam Location:  Inpatient Procedure: 2D Echo, Cardiac Doppler and Color Doppler STAT ECHO Indications:    Cardiac arrest I46.9  History:        Patient has prior history of Echocardiogram examinations, most                 recent 05/23/2019. Arrythmias:Cardiac Arrest,                 Signs/Symptoms:Chest Pain; Risk Factors:Hypertension,                 Dyslipidemia and Former Smoker.  Sonographer:    Paulita Fujita RDCS Referring Phys: [id number] CHI JANE ELLISON IMPRESSIONS  1. Left ventricular ejection fraction, by visual estimation, is 25 to 30%. The left ventricle has severely decreased function. There is mildly increased left ventricular hypertrophy.  2. Left ventricular diastolic parameters are consistent with Grade I diastolic dysfunction (impaired relaxation).  3. The left ventricle demonstrates global hypokinesis.  4. Global right ventricle has moderately reduced systolic function.The right ventricular size is normal. No increase in right ventricular wall thickness.  5. Left atrial size was normal.  6. Right atrial size was normal.  7. The mitral valve is normal in structure. Trivial mitral valve regurgitation. No evidence of mitral stenosis.  8. The tricuspid valve is normal in structure.  9. The tricuspid valve is normal in structure. Tricuspid valve regurgitation is trivial. 10. The aortic valve is tricuspid. Aortic valve regurgitation is trivial. No evidence of aortic valve sclerosis or stenosis. 11. The pulmonic valve was normal in structure. Pulmonic valve regurgitation is not visualized. 12. Moderately elevated pulmonary artery systolic pressure. 13. The tricuspid regurgitant velocity is 2.73 m/s, and with an assumed right atrial pressure of 15 mmHg, the estimated right ventricular systolic pressure is moderately elevated at 44.8 mmHg. 14. The inferior vena cava is dilated in size with <50% respiratory variability, suggesting right atrial pressure of 15 mmHg. 15. No  significant change from prior study. FINDINGS  Left Ventricle: Left ventricular ejection fraction, by visual estimation, is 25 to 30%. The left ventricle has severely decreased function. The left ventricle demonstrates global hypokinesis. There is mildly increased left ventricular hypertrophy. Left ventricular diastolic parameters are consistent with Grade I diastolic dysfunction (impaired relaxation). Normal left atrial pressure. Right Ventricle: The right ventricular size is normal. No increase in right ventricular wall thickness. Global RV systolic function is has moderately reduced systolic function. The tricuspid regurgitant velocity is 2.73 m/s, and with an assumed right atrial pressure of 15 mmHg, the estimated right ventricular systolic pressure is moderately elevated at 44.8 mmHg. Left Atrium: Left atrial size was normal in size. Right Atrium: Right atrial size was normal in size Pericardium: There is no evidence of pericardial effusion. Mitral Valve: The mitral valve is normal in structure. There is mild thickening of the mitral valve leaflet(s). Trivial mitral valve regurgitation. No evidence of mitral valve stenosis by observation. Tricuspid Valve: The tricuspid valve is normal in structure. Tricuspid valve regurgitation is trivial. Aortic Valve: The aortic valve is tricuspid. . There is mild thickening and mild calcification of the aortic valve. Aortic valve regurgitation is trivial. The aortic valve is structurally normal, with no evidence of sclerosis or stenosis. There is mild thickening of the aortic valve. There is mild calcification of the aortic valve. Pulmonic Valve: The pulmonic valve was normal in structure. Pulmonic valve regurgitation is not visualized. Pulmonic regurgitation is not visualized. Aorta: The aortic root, ascending aorta and aortic arch are all structurally normal, with no evidence of dilitation or obstruction. Venous: The inferior vena cava is dilated in size with less than 50%  respiratory variability, suggesting right atrial pressure of 15 mmHg. IAS/Shunts: No atrial level shunt detected by color flow Doppler. There is no evidence of a patent foramen ovale. No ventricular septal defect is seen or detected. There is no evidence of an atrial septal defect.  LEFT VENTRICLE PLAX 2D LVIDd:         5.20 cm LVIDs:         4.60 cm LV PW:         1.20 cm LV IVS:        1.20 cm LVOT diam:     2.10 cm LV SV:         32 ml LV SV Index:   12.71 LVOT Area:     3.46 cm  LV Volumes (MOD) LV area d, A2C:    47.00 cm LV  area d, A4C:    59.90 cm LV area s, A2C:    36.30 cm LV area s, A4C:    49.10 cm LV major d, A2C:   9.44 cm LV major d, A4C:   10.20 cm LV major s, A2C:   8.16 cm LV major s, A4C:   9.55 cm LV vol d, MOD A2C: 195.0 ml LV vol d, MOD A4C: 290.0 ml LV vol s, MOD A2C: 136.0 ml LV vol s, MOD A4C: 212.0 ml LV SV MOD A2C:     59.0 ml LV SV MOD A4C:     290.0 ml LV SV MOD BP:      63.5 ml RIGHT VENTRICLE TAPSE (M-mode): 1.3 cm LEFT ATRIUM             Index       RIGHT ATRIUM           Index LA diam:        3.60 cm 1.53 cm/m  RA Area:     21.00 cm LA Vol (A2C):   56.7 ml 24.04 ml/m RA Volume:   59.50 ml  25.23 ml/m LA Vol (A4C):   47.6 ml 20.18 ml/m LA Biplane Vol: 54.2 ml 22.98 ml/m  AORTIC VALVE LVOT Vmax:   117.00 cm/s LVOT Vmean:  69.800 cm/s LVOT VTI:    0.167 m  AORTA Ao Root diam: 3.20 cm TRICUSPID VALVE TR Peak grad:   29.8 mmHg TR Vmax:        273.00 cm/s  SHUNTS Systemic VTI:  0.17 m Systemic Diam: 2.10 cm  Candee Furbish MD Electronically signed by Candee Furbish MD Signature Date/Time: 05/25/2019/8:58:28 AM    Final    ECHOCARDIOGRAM COMPLETE  Result Date: 05/23/2019   ECHOCARDIOGRAM REPORT   Patient Name:   PELHAM SAILE Date of Exam: 05/23/2019 Medical Rec #:  [id number]   Height:       68.0 in Accession #:    [id number]  Weight:       270.8 lb Date of Birth:  06-23-1960   BSA:          2.32 m Patient Age:    25 years    BP:           153/94 mmHg Patient Gender: M           HR:            77 bpm. Exam Location:  Inpatient Procedure: 2D Echo, Cardiac Doppler and Color Doppler Indications:    Chest pain  History:        Patient has no prior history of Echocardiogram examinations.                 Signs/Symptoms:Chest Pain; Risk Factors:Hypertension and                 Dyslipidemia. Alcohol abuse, Cocaine abuse.  Sonographer:    Dustin Flock Referring Phys: [id number] Darreld Mclean  Sonographer Comments: Patient is morbidly obese. IMPRESSIONS  1. Left ventricular ejection fraction, by visual estimation, is 25 to 30%. The left ventricle has severely decreased function. There is moderately increased left ventricular hypertrophy.  2. Left ventricular diastolic parameters are consistent with Grade I diastolic dysfunction (impaired relaxation).  3. The left ventricle demonstrates global hypokinesis.  4. Global right ventricle has mildly reduced systolic function.The right ventricular size is normal. No increase in right ventricular wall thickness.  5. Left atrial size was mildly dilated.  6. Right atrial size was  mildly dilated.  7. The mitral valve is normal in structure. Mild mitral valve regurgitation. No evidence of mitral stenosis.  8. The tricuspid valve is normal in structure. Tricuspid valve regurgitation is not demonstrated.  9. The aortic valve is tricuspid. Aortic valve regurgitation is mild. Mild aortic valve sclerosis without stenosis. 10. The inferior vena cava is normal in size with greater than 50% respiratory variability, suggesting right atrial pressure of 3 mmHg. 11. TR signal is inadequate for assessing pulmonary artery systolic pressure. FINDINGS  Left Ventricle: Left ventricular ejection fraction, by visual estimation, is 25 to 30%. The left ventricle has severely decreased function. The left ventricle demonstrates global hypokinesis. The left ventricular internal cavity size was the left ventricle is normal in size. There is moderately increased left ventricular hypertrophy.  Left ventricular diastolic parameters are consistent with Grade I diastolic dysfunction (impaired relaxation). Right Ventricle: The right ventricular size is normal. No increase in right ventricular wall thickness. Global RV systolic function is has mildly reduced systolic function. Left Atrium: Left atrial size was mildly dilated. Right Atrium: Right atrial size was mildly dilated Pericardium: There is no evidence of pericardial effusion. Mitral Valve: The mitral valve is normal in structure. Mild mitral valve regurgitation. No evidence of mitral valve stenosis by observation. Tricuspid Valve: The tricuspid valve is normal in structure. Tricuspid valve regurgitation is not demonstrated. Aortic Valve: The aortic valve is tricuspid. Aortic valve regurgitation is mild. Mild aortic valve sclerosis is present, with no evidence of aortic valve stenosis. Pulmonic Valve: The pulmonic valve was normal in structure. Pulmonic valve regurgitation is not visualized. Pulmonic regurgitation is not visualized. Aorta: The aortic root is normal in size and structure. Venous: The inferior vena cava is normal in size with greater than 50% respiratory variability, suggesting right atrial pressure of 3 mmHg. IAS/Shunts: No atrial level shunt detected by color flow Doppler.  LEFT VENTRICLE PLAX 2D LVIDd:         5.52 cm  Diastology LVIDs:         4.56 cm  LV e' lateral:   5.87 cm/s LV PW:         1.43 cm  LV E/e' lateral: 14.5 LV IVS:        1.49 cm  LV e' medial:    5.55 cm/s LVOT diam:     2.10 cm  LV E/e' medial:  15.4 LV SV:         53 ml LV SV Index:   21.44 LVOT Area:     3.46 cm  RIGHT VENTRICLE RV Basal diam:  2.62 cm RV S prime:     9.68 cm/s TAPSE (M-mode): 3.4 cm LEFT ATRIUM           Index       RIGHT ATRIUM           Index LA diam:      3.80 cm 1.63 cm/m  RA Area:     18.90 cm LA Vol (A2C): 33.0 ml 14.20 ml/m RA Volume:   53.00 ml  22.80 ml/m LA Vol (A4C): 83.1 ml 35.75 ml/m  AORTIC VALVE LVOT Vmax:   83.30 cm/s LVOT  Vmean:  55.900 cm/s LVOT VTI:    0.160 m  AORTA Ao Root diam: 3.10 cm MITRAL VALVE MV Area (PHT): 4.68 cm             SHUNTS MV PHT:        46.98 msec  Systemic VTI:  0.16 m MV Decel Time: 162 msec             Systemic Diam: 2.10 cm MV E velocity: 85.30 cm/s 103 cm/s MV A velocity: 67.30 cm/s 70.3 cm/s MV E/A ratio:  1.27       1.5  Loralie Champagne MD Electronically signed by Loralie Champagne MD Signature Date/Time: 05/23/2019/3:37:26 PM    Final    VAS [place] UPPER EXTREMITY VENOUS DUPLEX  Result Date: 06/01/2019 UPPER VENOUS STUDY  Indications: Swelling Limitations: Poor ultrasound/tissue interface, bandages and line. Performing Technologist: Antonieta Pert RDMS, RVT  Examination Guidelines: A complete evaluation includes B-mode imaging, spectral Doppler, color Doppler, and power Doppler as needed of all accessible portions of each vessel. Bilateral testing is considered an integral part of a complete examination. Limited examinations for reoccurring indications may be performed as noted.  Right Findings: +----------+------------+---------+-----------+----------+-------+ RIGHT     CompressiblePhasicitySpontaneousPropertiesSummary +----------+------------+---------+-----------+----------+-------+ Subclavian    Full       Yes       Yes                      +----------+------------+---------+-----------+----------+-------+  Left Findings: +----------+------------+---------+-----------+----------+-------------------+ LEFT      CompressiblePhasicitySpontaneousProperties      Summary       +----------+------------+---------+-----------+----------+-------------------+ IJV           Full       Yes       Yes                                  +----------+------------+---------+-----------+----------+-------------------+ Subclavian    Full       Yes       Yes                                  +----------+------------+---------+-----------+----------+-------------------+ Axillary       Full       Yes       Yes                                  +----------+------------+---------+-----------+----------+-------------------+ Brachial      Full                                                      +----------+------------+---------+-----------+----------+-------------------+ Radial                                                Not visualized    +----------+------------+---------+-----------+----------+-------------------+ Ulnar                                                 Not visualized    +----------+------------+---------+-----------+----------+-------------------+ Cephalic      Full                                                      +----------+------------+---------+-----------+----------+-------------------+  Basilic     Partial                                 not well visualized +----------+------------+---------+-----------+----------+-------------------+  Summary:  Right: No evidence of thrombosis in the subclavian.  Left: No evidence of deep vein thrombosis in the upper extremity. No evidence of superficial vein thrombosis in the upper extremity. Left forearm covered by bandages.  *See table(s) above for measurements and observations.  Diagnosing physician: Deitra Mayo MD Electronically signed by Deitra Mayo MD on 06/01/2019 at 7:50:35 AM.    Final     Microbiology Recent Results (from the past 240 hour(s))  Culture, blood (routine x 2)     Status: None   Collection Time: 06/07/19 10:00 AM   Specimen: BLOOD RIGHT HAND  Result Value Ref Range Status   Specimen Description BLOOD RIGHT HAND  Final   Special Requests   Final    BOTTLES DRAWN AEROBIC ONLY Blood Culture adequate volume   Culture   Final    NO GROWTH 5 DAYS Performed at [facility], [address], [place], [place] [address]    Report Status June 14, 2019 FINAL  Final  Culture, blood (routine x 2)     Status: None   Collection Time: 06/07/19 10:15 AM    Specimen: BLOOD LEFT ARM  Result Value Ref Range Status   Specimen Description BLOOD LEFT ARM  Final   Special Requests   Final    BOTTLES DRAWN AEROBIC ONLY Blood Culture adequate volume   Culture   Final    NO GROWTH 5 DAYS Performed at [facility], [place] [address] [place], [place] [address]    Report Status 06-14-2019 FINAL  Final    Lab Basic Metabolic Panel: Recent Labs  Lab 06/07/19 1005 06/09/19 0210 06/10/19 0228 06/10/19 1110 06/11/19 0651  NA 149* 153* 161* 158* 159*  K 4.6 3.9 3.7 3.9 4.2  CL 112* 119* 123* 126* 126*  CO2 20* 21* 22 21* 21*  GLUCOSE 137* 134* 151* 127* 98  BUN 80* 124* 125* 123* 121*  CREATININE 2.14* 3.55* 3.26* 3.29* 3.15*  CALCIUM 9.5 9.3 9.7 9.1 9.3   Liver Function Tests: No results for input(s): AST, ALT, ALKPHOS, BILITOT, PROT, ALBUMIN in the last 168 hours. No results for input(s): LIPASE, AMYLASE in the last 168 hours. No results for input(s): AMMONIA in the last 168 hours. CBC: Recent Labs  Lab 06/07/19 0246 06/07/19 0246 06/08/19 0219 06/09/19 0210 06/10/19 0228 06/11/19 0651 June 14, 2019 0524  WBC 31.7*   < > 26.6* 21.1* 15.8* 16.9* 16.3*  NEUTROABS 27.0*  --  22.5* 15.8* 11.5* 11.8*  --   HGB 10.4*   < > 9.4* 9.1* 9.0* 9.4* 8.7*  HCT 34.6*   < > 31.1* 30.4* 30.6* 32.6* 30.9*  MCV 76.9*   < > 77.2* 77.2* 76.5* 80.1 81.7  PLT 534*   < > 565* 614* 670* 605* 654*   < > = values in this interval not displayed.   Cardiac Enzymes: No results for input(s): CKTOTAL, CKMB, CKMBINDEX, TROPONINI in the last 168 hours. Sepsis Labs: Recent Labs  Lab 06/09/19 0210 06/10/19 0228 06/11/19 0651 Jun 14, 2019 0524  WBC 21.1* 15.8* 16.9* 16.3*    Procedures/Operations  Mechanical ventilation, EEG, cardiac catheterization.   Yamilee Harmes 06/13/2019, 4:27 PM

## 2019-07-04 NOTE — Progress Notes (Signed)
Patient ID: Xavier Doyle, male   DOB: 02-11-1961, 59 y.o.   MRN: [id number]  This NP visited patient at the bedside as a follow up  for palliative medicine needs and emotional support.  Patient remains intubated, unable to follow commands and continues to fail to thrive/ today is day 21 of hospital stay.    Again multiple attempts to contact son without success.  Apparently he did come to [place] last week  but fail to communicate with myself or attending physician regarding shifting plan of care to comfort, for his father,  and liberating him from the vent as discussed many time  Today I called and spoke to his cousin Xavier Doyle, she originally was our main contact.  She tells me Xavier Doyle "went back to [place]", "she didn't understand why  "he has told us so many things I don't know what to believe"  Discussed patient's current medical situation with Xavier Doyle,  and the grim prognosis.  She verbalizes understanding and tells me "he would never want to be kept alive like that"  Questions and concerns addressed              Emotional support offered  Discussed with Dr Xavier Doyle    Total time spent on the unit was 25 minutes     PMT will continue to support holistically  Greater than 50% of the time was spent in counseling and coordination of care  Xavier Lessen NP  Palliative Medicine Team Team Phone # [phone] Pager [phone]

## 2019-07-04 NOTE — Progress Notes (Signed)
Asystole noted on monitor at 1733. Declared at 1740 by Inis Sizer RN and Sharyn Dross RN.

## 2019-07-04 NOTE — Progress Notes (Signed)
NAME:  Xavier Doyle, MRN:  [id number], DOB:  [birth year], LOS: 21 ADMISSION DATE:  05/10/2019, CONSULTATION DATE: 06/01/2019 REFERRING MD: Family practice teaching service, CHIEF COMPLAINT: Anoxic brain injury status post cardiac arrest  Brief History   59 year old man status post cardiac arrest with poor neurological recovery.  History of present illness   Xavier Doyle is a 59 year old male with history of hypertension, hyperlipidemia, asthma, and depression admitted 05/30/2019 for chest pain.  Patient found to have newly diagnosed systolic heart failure with ejection fraction 25-30% with global hypokinesis.  Patient had cardiac arrest during hospitalization, suffered anoxic brain injury with poor neurological recovery.  Family has agreed to modified DNR status until family can fly in from [place] to be with patient, at which point he will most likely be transitioned to comfort care.  Past Medical History   Patient Active Problem List   Diagnosis Date Noted  . Adult failure to thrive   . Acute respiratory failure ([place])   . Anoxic brain damage ([facility])   . Palliative care by specialist   . DNR (do not resuscitate) discussion   . Endotracheally intubated   . Pressure injury of skin 05/28/2019  . Respiratory failure with hypoxia ([place]) 05/27/2019  . Nonischemic cardiomyopathy ([place]) 05/25/2019  . Chest pain 05/06/2019  . Cardiac arrest ([place])   . Ventricular fibrillation ([place])   . Prediabetes 05/23/2019  . Hyperlipidemia 05/23/2019  . Morbid obesity ([place]) 05/23/2019  . Essential hypertension 05/23/2019  . Acute HFrEF (heart failure with reduced ejection fraction) ([place])   . Chest pain with high risk for cardiac etiology 05/30/2019   Significant Hospital Events   1/17 admitted to [facility] for chest pain 1/19 V. 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  1/28 titrated off of dobutamine 2/01 made partial DNR (can have medicinal resuscitation, intubated; no chest compressions) 2/02 bronchoscopy to obtain sputum culture, assess new CXR opacity 2/05 son did not show up to assist with goals of care discussion.  Consults:  Palliative care, case management, critical care  Procedures:  1/19: EEG initiated, stopped 1/21 1/20: TTE 1/24: EEG due to myoclonic activity 2/02: bronchoscopy  Significant Diagnostic Tests:   CXR 2/3-increased aeration left lung.  Micro Data:  Tracheal aspirate 1/22-Staph aureus.  Interim history/subjective:  Uneventful night. Patient noted to have sodium 161 early this AM.  Continues to have stimulus-induced myoclonus.  Unable to reach son.  Objective   Blood pressure 107/62, pulse 77, temperature 99.9 F (37.7 C), resp. rate (!) 30, height 5\' 8"  (1.727 m), weight 115.6 kg, SpO2 100 %.    Vent Mode: PRVC FiO2 (%):  [50 %-60 %] 50 % Set Rate:  [30 bmp] 30 bmp Vt Set:  [540 mL] 540 mL PEEP:  [5 cmH20] 5 cmH20 Plateau Pressure:  [17 cmH20-21 cmH20] 20 cmH20   Intake/Output Summary (Last 24 hours) at June 22, 2019 1325 Last data filed at 2019/06/22 1200 Gross per 24 hour  Intake 3382.59 ml  Output 2950 ml  Net 432.59 ml   Filed Weights   06/08/19 0500 06/09/19 0615 06/10/19 0400  Weight: 118 kg 118.4 kg 115.6 kg  Examination: General: Unresponsive, ill-appearing HENT: Normocephalic, atraumatic, ET tube in place Lungs: Left lower lobe wheeze, right lower lobe crackles Cardiovascular: RRR, S1-S2 clear, no murmurs appreciated Abdomen: Soft, normal bowel sounds appreciated Extremities: Trace edema to bilateral lower extremities, pulses within  normal limits, difficult to palpate secondary to right-sided myoclonus Neuro: Unresponsive to voice and painful stimulation, right-sided myoclonus appreciated today  Resolved Hospital Problem list   Cardiogenic shock secondary to V. 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 Due to the extensive neurological injury the patient has a poor prognosis. - Goals of care discussion ongoing with son, expected to visit in person today 06/10/2019.  Patient is a modified DNR (all interventions appreciated except chest compressions), will likely transition to full comfort care after son's arrival - Continue below medication regimen -ContinueVersed infusion 10mg /hr,Fentanyl 100-385mcg/hr -ContinueValproic Acid 500mg  BID per tube and Keppra 1000mg  BID - Seizure Precautions in place  -Clonazepam 2mg  BID per tube - Oxycodone 5mg  q6 per tube - Quetiapine 25mg  daily  HFrEF (EF 25-30% G1DD) -Continue amiodarone400mg  BID. Hemodynamically stable. -Atorvastatin80mg  daily - could discontinue d/t recent change to plan of care  Fever ON, improving WBC count: WBC 21>15.8, fever ON100.4 F 06/10/2019.  Blood culture showing no growth at 2 days.  Sputum culture still process. -Cefepime and Vancomycinper pharmacy - Tylenol PRN fevers  Acute hypoxemic respiratory failure,secondary to pulmonary edema from [facility] acquired/Aspiration Pneumonia. Diuresed 2.5 L (net 1.9 L out) 02/04. Lasix IV 80 x1. Bronchoscopy 02/02 revealed feed-appearing material in the lungs concerning for aspiration. - Stop feeding supplement -Cefepime and vancomycin per pharmacy -Continue pulmonary hygiene,Chest physiotherapy -Torsemide 20 mg p.o. daily  Acute renal  failure due to cardiorenal syndrome:serum creatinine slightly improved to 3.55> 3.26 on 2/5.  Patient diuresed 1.9 L out yesterday on torsemide 20 mg. - Monitor BMP, urine output -Torsemide 20 mg daily p.o.  Hypernatremia,  acutely worse: 153>16102/09/2019.High risk for DI given his brain injury. -Increased free water via tube  T2DM, chronic, stable: A1c 6.4%. CBG 06/185-215. - SSI q4 hours PRN Hyperglycemia -CBG checks every 4 hours - Levemir 10 units daily  DVT Prophylaxis: Heparin 5,000 units q8  Goals of care: We have been waiting for the patient's son to arrive from [place] to finalize a decision to proceed with comfort care.  We have been unable to reach the son both myself and palliative care despite multiple phone calls.  I reached out to the patient's sister in [place], who is also been unable to reach the patient's son.  She concurs that the patient is suffering and that we should proceed to withdrawal of care.  The palliative care nurse practitioner has likewise been unable to reach the son nor have other family members that she has spoken to all of whom agree that the patient would not want to be maintaining his current condition.  We believe at this point given a patient who has suffered a cardiac arrest with signs of severe anoxic injury with persistent myoclonus requiring continuous infusions that it is most appropriate to proceed to a withdrawal of active support and transition to comfort care on the best evidence and expertise of the medical team and the consensus of those family members who have been available for discussion.  Orders for comfort care have been entered.  Patient will be  extubated today.  Labs   CBC: Recent Labs  Lab 06/07/19 0246 06/07/19 0246 06/08/19 [id number] 06/09/19 0210 06/10/19 0228 06/11/19 0651 07/06/19 0524  WBC 31.7*   < > 26.6* 21.1* 15.8* 16.9* 16.3*  NEUTROABS 27.0*  --  22.5* 15.8* 11.5* 11.8*  --   HGB 10.4*   < > 9.4*  9.1* 9.0* 9.4* 8.7*  HCT 34.6*   < > 31.1* 30.4* 30.6* 32.6* 30.9*  MCV 76.9*   < > 77.2* 77.2* 76.5* 80.1 81.7  PLT 534*   < > 565* 614* 670* 605* 654*   < > = values in this interval not displayed.    Basic Metabolic Panel: Recent Labs  Lab 06/06/19 0332 06/06/19 0332 06/07/19 1005 06/09/19 0210 06/10/19 0228 06/10/19 1110 06/11/19 0651  NA 153*   < > 149* 153* 161* 158* 159*  K 3.5   < > 4.6 3.9 3.7 3.9 4.2  CL 113*   < > 112* 119* 123* 126* 126*  CO2 24   < > 20* 21* 22 21* 21*  GLUCOSE 121*   < > 137* 134* 151* 127* 98  BUN 99*   < > 80* 124* 125* 123* 121*  CREATININE 2.06*   < > 2.14* 3.55* 3.26* 3.29* 3.15*  CALCIUM 10.2   < > 9.5 9.3 9.7 9.1 9.3  MG 2.3  --   --   --   --   --   --    < > = values in this interval not displayed.   GFR: Estimated Creatinine Clearance: 31.6 mL/min (A) (by C-G formula based on SCr of 3.15 mg/dL (H)). Recent Labs  Lab 06/09/19 0210 06/10/19 0228 06/11/19 0651 2019/07/06 0524  WBC 21.1* 15.8* 16.9* 16.3*   Liver Function Tests: No results for input(s): AST, ALT, ALKPHOS, BILITOT, PROT, ALBUMIN in the last 168 hours. No results for input(s): LIPASE, AMYLASE in the last 168 hours. No results for input(s): AMMONIA in the last 168 hours.  ABG    Component Value Date/Time   PHART 7.436 05/29/2019 1050   PCO2ART 26.7 (L) 05/29/2019 1050   PO2ART 94.7 05/29/2019 1050   HCO3 17.9 (L) 05/29/2019 1050   TCO2 21 (L) 05/29/2019 0328   ACIDBASEDEF 5.8 (H) 05/29/2019 1050   O2SAT 97.5 05/29/2019 1050   Coagulation Profile: No results for input(s): INR, PROTIME in the last 168 hours.  Cardiac Enzymes: No results for input(s): CKTOTAL, CKMB, CKMBINDEX, TROPONINI in the last 168 hours.  HbA1C: Hgb A1c MFr Bld  Date/Time Value Ref Range Status  05/23/2019 07:28 AM 6.4 (H) 4.8 - 5.6 % Final    Comment:    (NOTE) Pre diabetes:          5.7%-6.4% Diabetes:              >6.4% Glycemic control for   <7.0% adults with diabetes      CBG: Recent Labs  Lab 06/11/19 1952 06/11/19 2323 07/06/2019 0325 07-06-19 0749 07/06/2019 1130  GLUCAP 92 108* 79 71 96  Jul 06, 2019 1:25 PM   Kipp Brood, MD [facility] ICU Physician [place]  Pager: [phone] Mobile: [phone] After hours: [phone].

## 2019-07-04 DEATH — deceased

## 2020-04-18 IMAGING — DX DG CHEST 1V PORT
1 series · 1 of 1 positions shown · non-contrast
Comparison: 05/28/2019

CLINICAL DATA: Ventilator support.  Follow-up.

EXAM:
PORTABLE CHEST 1 VIEW

[chest ap]
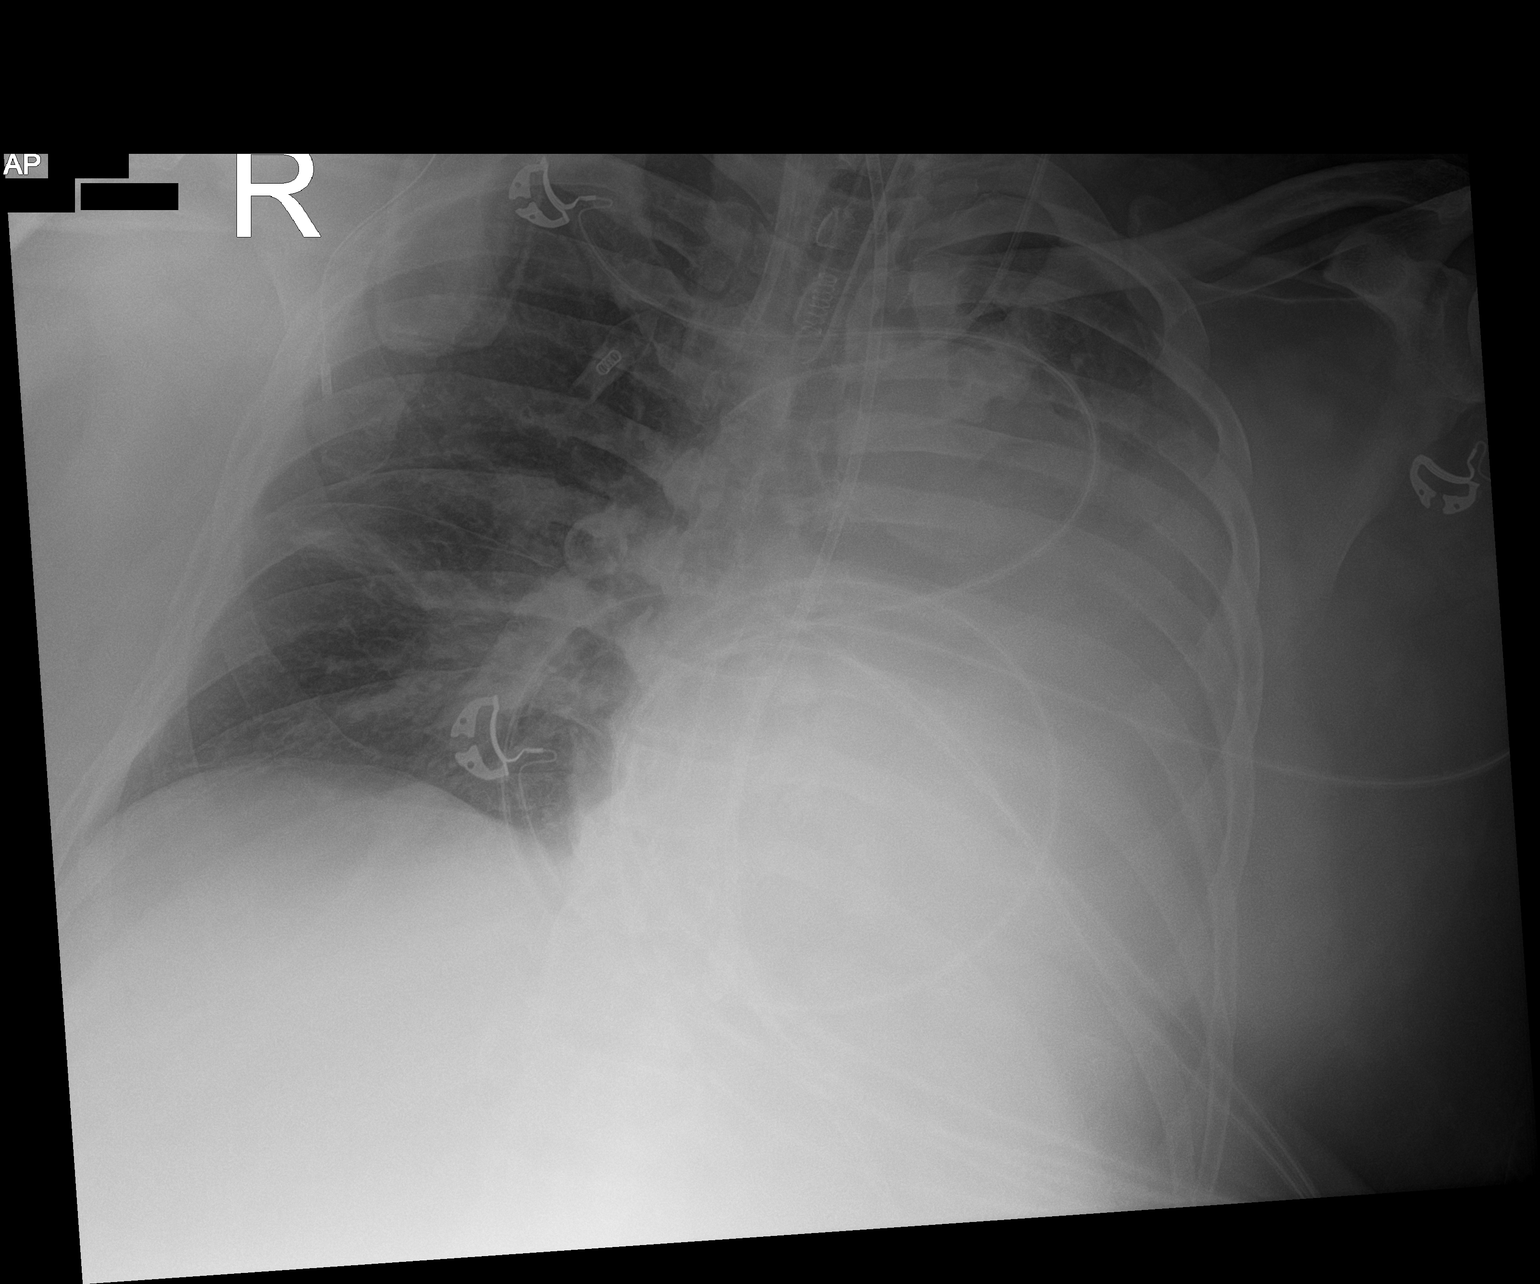

[1 of 1 positions shown; findings below may reference images not displayed]

FINDINGS: Endotracheal tube tip is 4 cm above the carina. Soft feeding tube
enters the abdomen. Left internal jugular central line tip is in the
SVC at the azygos level. Right lung shows improvement with better
aeration in the lower lobe. Worsened opacity in the left hemithorax
with less aerated upper lobe. Findings could be consistent with
consolidation and pleural fluid.
IMPRESSION: Worsened appearance on the left with less aerated lung.
Consolidation and possible pleural fluid.

Improved aeration of the right lung base.

## 2020-04-26 IMAGING — DX DG CHEST 1V PORT
1 series · 1 of 1 positions shown · non-contrast
Comparison: 06/06/2019

CLINICAL DATA: Fevers.

EXAM:
PORTABLE CHEST 1 VIEW

[chest ap]
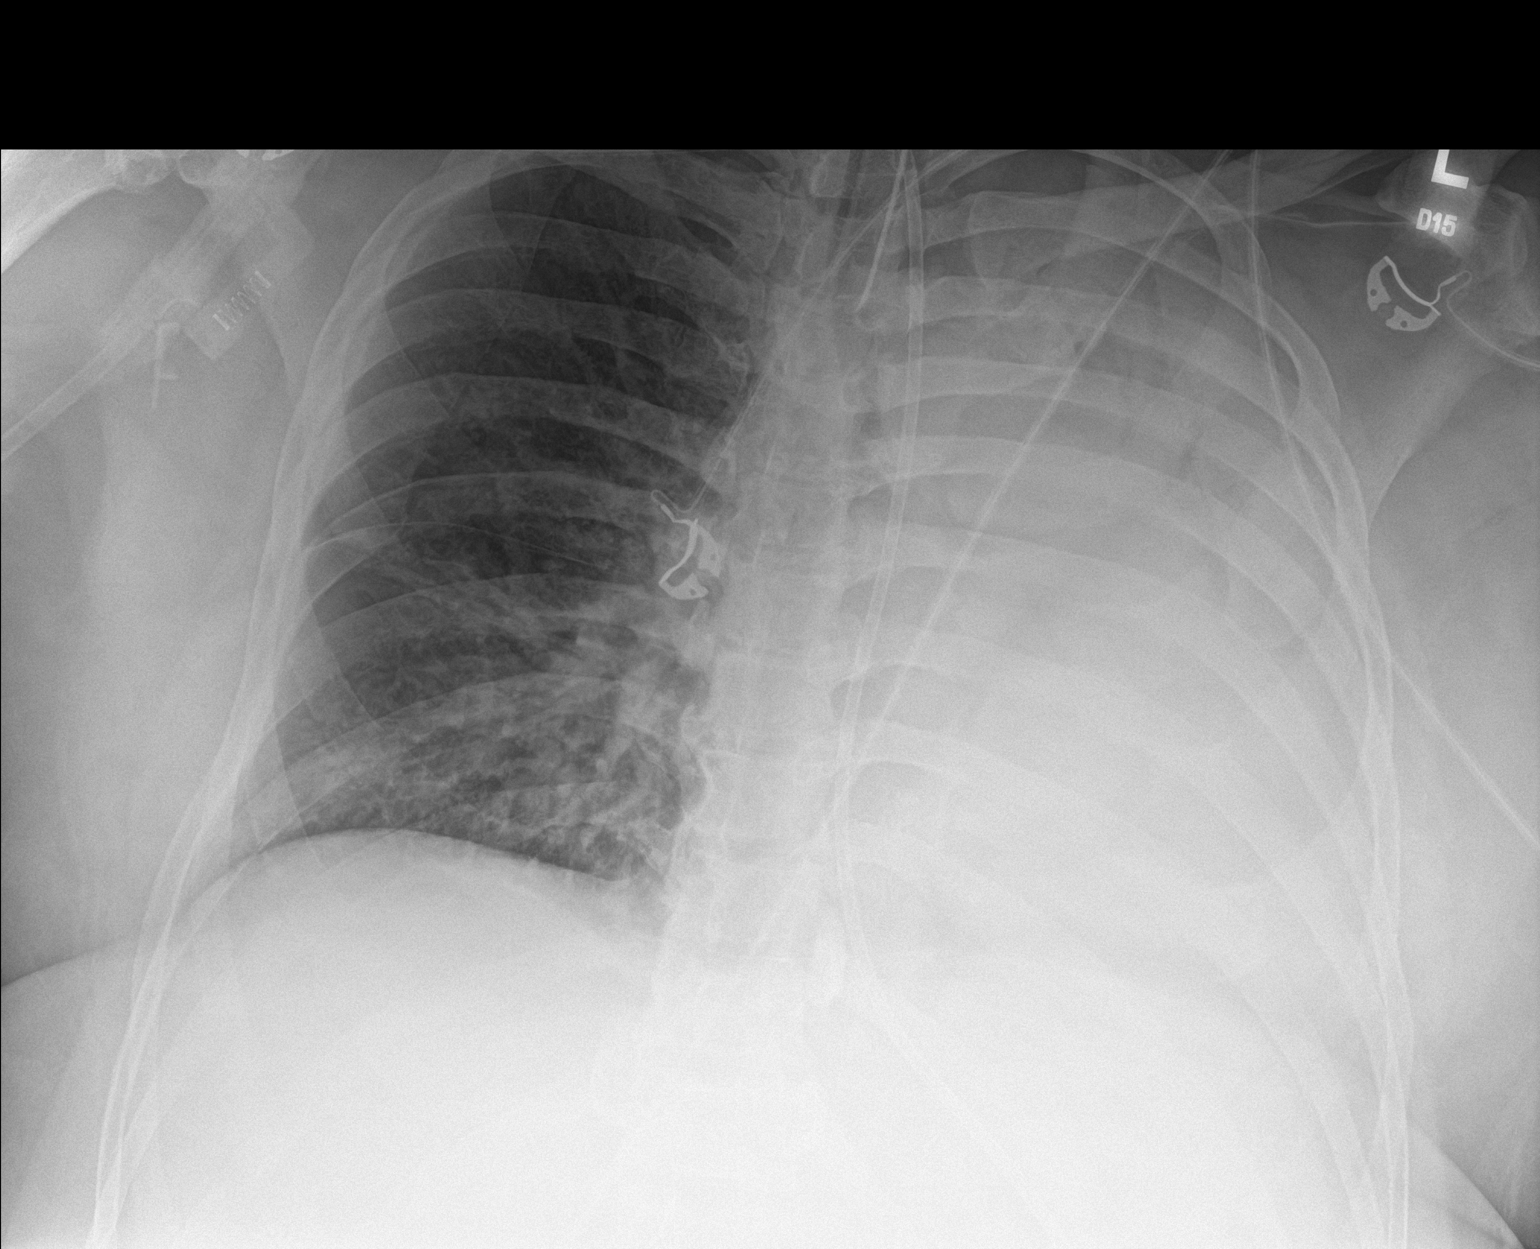

[1 of 1 positions shown; findings below may reference images not displayed]

FINDINGS: ET tube tip is above the carina. There is a feeding tube with tip
below the GE junction. Interval complete opacification of the left
lung is identified compared with 06/06/2019. Findings are favored to
represent complete atelectasis of the left lung which may reflect
underlying mucous plugging. Increase interstitial markings within
the right base also noted.
IMPRESSION: Complete opacification of the left lung favored to represent
complete atelectasis of the left lung which may reflect underlying
mucous plugging. Follow-up imaging advised to ensure resolution.

Increased interstitial markings noted within the right base.

These results will be called to the ordering clinician or
representative by the Radiologist Assistant, and communication
documented in the PACS or zVision Dashboard.
